# Patient Record
Sex: Female | Born: 1950 | Race: White | Hispanic: No | Marital: Married | Smoking: Never smoker
Health system: Southern US, Community
[De-identification: ages and names within clinical notes are randomized; demographics above are authoritative.]

## PROBLEM LIST (undated history)

## (undated) DIAGNOSIS — E785 Hyperlipidemia, unspecified: Secondary | ICD-10-CM

## (undated) DIAGNOSIS — F329 Major depressive disorder, single episode, unspecified: Secondary | ICD-10-CM

## (undated) DIAGNOSIS — E28319 Asymptomatic premature menopause: Secondary | ICD-10-CM

## (undated) DIAGNOSIS — I341 Nonrheumatic mitral (valve) prolapse: Secondary | ICD-10-CM

## (undated) DIAGNOSIS — M5126 Other intervertebral disc displacement, lumbar region: Secondary | ICD-10-CM

## (undated) DIAGNOSIS — F32A Depression, unspecified: Secondary | ICD-10-CM

## (undated) DIAGNOSIS — T7840XA Allergy, unspecified, initial encounter: Secondary | ICD-10-CM

## (undated) DIAGNOSIS — R918 Other nonspecific abnormal finding of lung field: Secondary | ICD-10-CM

## (undated) DIAGNOSIS — F419 Anxiety disorder, unspecified: Secondary | ICD-10-CM

## (undated) DIAGNOSIS — I10 Essential (primary) hypertension: Secondary | ICD-10-CM

## (undated) DIAGNOSIS — G43909 Migraine, unspecified, not intractable, without status migrainosus: Secondary | ICD-10-CM

## (undated) HISTORY — PX: APPENDECTOMY: SHX54

## (undated) HISTORY — DX: Nonrheumatic mitral (valve) prolapse: I34.1

## (undated) HISTORY — DX: Anxiety disorder, unspecified: F41.9

## (undated) HISTORY — DX: Essential (primary) hypertension: I10

## (undated) HISTORY — DX: Hyperlipidemia, unspecified: E78.5

## (undated) HISTORY — DX: Asymptomatic premature menopause: E28.319

## (undated) HISTORY — DX: Migraine, unspecified, not intractable, without status migrainosus: G43.909

## (undated) HISTORY — DX: Major depressive disorder, single episode, unspecified: F32.9

## (undated) HISTORY — DX: Depression, unspecified: F32.A

## (undated) HISTORY — DX: Other intervertebral disc displacement, lumbar region: M51.26

## (undated) HISTORY — DX: Allergy, unspecified, initial encounter: T78.40XA

## (undated) HISTORY — DX: Other nonspecific abnormal finding of lung field: R91.8

---

## 1977-07-12 HISTORY — PX: ABDOMINAL HYSTERECTOMY: SHX81

## 2006-02-07 LAB — HM COLONOSCOPY: HM Colonoscopy: NORMAL

## 2007-02-17 ENCOUNTER — Ambulatory Visit: Payer: Self-pay | Admitting: Family Medicine

## 2007-02-17 DIAGNOSIS — I059 Rheumatic mitral valve disease, unspecified: Secondary | ICD-10-CM

## 2007-02-17 DIAGNOSIS — M25539 Pain in unspecified wrist: Secondary | ICD-10-CM | POA: Insufficient documentation

## 2007-02-17 DIAGNOSIS — E28319 Asymptomatic premature menopause: Secondary | ICD-10-CM | POA: Insufficient documentation

## 2007-02-17 DIAGNOSIS — G43909 Migraine, unspecified, not intractable, without status migrainosus: Secondary | ICD-10-CM | POA: Insufficient documentation

## 2007-02-17 DIAGNOSIS — I341 Nonrheumatic mitral (valve) prolapse: Secondary | ICD-10-CM | POA: Insufficient documentation

## 2007-02-20 ENCOUNTER — Telehealth (INDEPENDENT_AMBULATORY_CARE_PROVIDER_SITE_OTHER): Payer: Self-pay | Admitting: *Deleted

## 2007-05-09 ENCOUNTER — Ambulatory Visit: Payer: Self-pay | Admitting: Family Medicine

## 2007-05-12 ENCOUNTER — Encounter: Payer: Self-pay | Admitting: Family Medicine

## 2007-05-16 ENCOUNTER — Telehealth (INDEPENDENT_AMBULATORY_CARE_PROVIDER_SITE_OTHER): Payer: Self-pay | Admitting: *Deleted

## 2007-08-22 ENCOUNTER — Ambulatory Visit: Payer: Self-pay | Admitting: Family Medicine

## 2007-08-22 LAB — CONVERTED CEMR LAB
Bilirubin Urine: NEGATIVE
Glucose, Urine, Semiquant: NEGATIVE
Protein, U semiquant: NEGATIVE
Specific Gravity, Urine: 1.015

## 2007-08-23 LAB — CONVERTED CEMR LAB: Clue Cells Wet Prep HPF POC: NONE SEEN

## 2008-07-12 DIAGNOSIS — R918 Other nonspecific abnormal finding of lung field: Secondary | ICD-10-CM

## 2008-07-12 HISTORY — DX: Other nonspecific abnormal finding of lung field: R91.8

## 2008-12-05 ENCOUNTER — Ambulatory Visit: Payer: Self-pay | Admitting: Family Medicine

## 2008-12-05 DIAGNOSIS — M79609 Pain in unspecified limb: Secondary | ICD-10-CM | POA: Insufficient documentation

## 2008-12-06 ENCOUNTER — Encounter: Payer: Self-pay | Admitting: Family Medicine

## 2008-12-06 ENCOUNTER — Telehealth (INDEPENDENT_AMBULATORY_CARE_PROVIDER_SITE_OTHER): Payer: Self-pay | Admitting: *Deleted

## 2008-12-06 LAB — CONVERTED CEMR LAB
Cholesterol: 234 mg/dL
LDL Cholesterol: 140 mg/dL
Triglyceride fasting, serum: 117 mg/dL

## 2008-12-10 ENCOUNTER — Encounter: Payer: Self-pay | Admitting: Family Medicine

## 2008-12-10 ENCOUNTER — Telehealth: Payer: Self-pay | Admitting: Family Medicine

## 2009-02-07 ENCOUNTER — Telehealth: Payer: Self-pay | Admitting: Family Medicine

## 2009-04-10 ENCOUNTER — Encounter: Payer: Self-pay | Admitting: Family Medicine

## 2009-04-29 ENCOUNTER — Telehealth: Payer: Self-pay | Admitting: Family Medicine

## 2009-06-27 ENCOUNTER — Telehealth: Payer: Self-pay | Admitting: Family Medicine

## 2009-08-15 ENCOUNTER — Encounter (INDEPENDENT_AMBULATORY_CARE_PROVIDER_SITE_OTHER): Payer: Self-pay | Admitting: *Deleted

## 2009-08-15 LAB — HM MAMMOGRAPHY: HM Mammogram: NORMAL

## 2009-09-15 ENCOUNTER — Telehealth: Payer: Self-pay | Admitting: Family Medicine

## 2009-10-30 ENCOUNTER — Encounter: Admission: RE | Admit: 2009-10-30 | Discharge: 2009-10-30 | Payer: Self-pay | Admitting: Family Medicine

## 2009-10-30 ENCOUNTER — Ambulatory Visit: Payer: Self-pay | Admitting: Family Medicine

## 2009-11-05 ENCOUNTER — Telehealth: Payer: Self-pay | Admitting: Family Medicine

## 2009-11-06 ENCOUNTER — Telehealth: Payer: Self-pay | Admitting: Family Medicine

## 2009-11-06 ENCOUNTER — Encounter: Payer: Self-pay | Admitting: Family Medicine

## 2009-11-14 ENCOUNTER — Telehealth: Payer: Self-pay | Admitting: Family Medicine

## 2009-11-17 ENCOUNTER — Encounter: Payer: Self-pay | Admitting: Family Medicine

## 2009-12-05 ENCOUNTER — Telehealth: Payer: Self-pay | Admitting: Family Medicine

## 2009-12-05 ENCOUNTER — Encounter: Payer: Self-pay | Admitting: Family Medicine

## 2009-12-24 ENCOUNTER — Encounter: Payer: Self-pay | Admitting: Family Medicine

## 2010-05-26 ENCOUNTER — Telehealth: Payer: Self-pay | Admitting: Family Medicine

## 2010-05-29 ENCOUNTER — Ambulatory Visit: Payer: Self-pay | Admitting: Family Medicine

## 2010-05-29 DIAGNOSIS — L639 Alopecia areata, unspecified: Secondary | ICD-10-CM | POA: Insufficient documentation

## 2010-05-29 DIAGNOSIS — N3000 Acute cystitis without hematuria: Secondary | ICD-10-CM | POA: Insufficient documentation

## 2010-05-29 DIAGNOSIS — F430 Acute stress reaction: Secondary | ICD-10-CM | POA: Insufficient documentation

## 2010-05-29 LAB — CONVERTED CEMR LAB
Blood in Urine, dipstick: NEGATIVE
Ketones, urine, test strip: NEGATIVE
Nitrite: NEGATIVE
Urobilinogen, UA: 0.2

## 2010-06-01 ENCOUNTER — Telehealth: Payer: Self-pay | Admitting: Family Medicine

## 2010-06-23 ENCOUNTER — Encounter: Payer: Self-pay | Admitting: Family Medicine

## 2010-07-03 ENCOUNTER — Encounter: Payer: Self-pay | Admitting: Family Medicine

## 2010-07-08 ENCOUNTER — Ambulatory Visit: Payer: Self-pay | Admitting: Family Medicine

## 2010-07-08 DIAGNOSIS — I10 Essential (primary) hypertension: Secondary | ICD-10-CM | POA: Insufficient documentation

## 2010-07-08 DIAGNOSIS — R030 Elevated blood-pressure reading, without diagnosis of hypertension: Secondary | ICD-10-CM | POA: Insufficient documentation

## 2010-07-24 ENCOUNTER — Telehealth: Payer: Self-pay | Admitting: Family Medicine

## 2010-08-06 ENCOUNTER — Emergency Department (HOSPITAL_COMMUNITY)
Admission: EM | Admit: 2010-08-06 | Discharge: 2010-08-06 | Payer: Self-pay | Source: Home / Self Care | Admitting: Family Medicine

## 2010-08-06 ENCOUNTER — Observation Stay (HOSPITAL_COMMUNITY)
Admission: EM | Admit: 2010-08-06 | Discharge: 2010-08-07 | Payer: Self-pay | Source: Home / Self Care | Attending: Cardiology | Admitting: Cardiology

## 2010-08-06 LAB — TROPONIN I: Troponin I: <0.01 ng/mL (ref 0.00–0.06)

## 2010-08-06 LAB — POCT I-STAT, CHEM 8
Creatinine, Ser: 1.1 mg/dL (ref 0.4–1.2)
Glucose, Bld: 83 mg/dL (ref 70–99)
Hemoglobin: 14.6 g/dL (ref 12.0–15.0)
TCO2: 25 mmol/L (ref 0–100)

## 2010-08-06 LAB — HEPATIC FUNCTION PANEL
Alkaline Phosphatase: 81 U/L (ref 39–117)
Bilirubin, Direct: <0.1 mg/dL (ref 0.0–0.3)

## 2010-08-06 LAB — DIFFERENTIAL
Basophils Absolute: 0 10*3/uL (ref 0.0–0.1)
Neutrophils Relative %: 62 % (ref 43–77)

## 2010-08-06 LAB — TSH: TSH: 3.804 u[IU]/mL (ref 0.350–4.500)

## 2010-08-06 LAB — CBC
HCT: 41.7 % (ref 36.0–46.0)
Hemoglobin: 14 g/dL (ref 12.0–15.0)
MCH: 30.2 pg (ref 26.0–34.0)
MCHC: 33.6 g/dL (ref 30.0–36.0)
MCV: 90.1 fL (ref 78.0–100.0)
Platelets: 228 10*3/uL (ref 150–400)
RBC: 4.63 MIL/uL (ref 3.87–5.11)
RDW: 12.8 % (ref 11.5–15.5)
WBC: 5.7 10*3/uL (ref 4.0–10.5)

## 2010-08-06 LAB — HEMOGLOBIN A1C: Hgb A1c MFr Bld: 5.5 % (ref <5.7)

## 2010-08-06 LAB — POCT CARDIAC MARKERS
CKMB, poc: <1 ng/mL — ABNORMAL LOW (ref 1.0–8.0)
Myoglobin, poc: 52.2 ng/mL (ref 12–200)
Troponin i, poc: <0.05 ng/mL (ref 0.00–0.09)

## 2010-08-07 LAB — BASIC METABOLIC PANEL
BUN: 15 mg/dL (ref 6–23)
Calcium: 9.4 mg/dL (ref 8.4–10.5)
Chloride: 108 mEq/L (ref 96–112)
Creatinine, Ser: 0.91 mg/dL (ref 0.4–1.2)
Glucose, Bld: 92 mg/dL (ref 70–99)
Sodium: 141 mEq/L (ref 135–145)

## 2010-08-07 LAB — CBC
Hemoglobin: 13.3 g/dL (ref 12.0–15.0)
RBC: 4.39 MIL/uL (ref 3.87–5.11)
RDW: 13 % (ref 11.5–15.5)
WBC: 4.6 10*3/uL (ref 4.0–10.5)

## 2010-08-07 LAB — LIPID PANEL
Cholesterol: 202 mg/dL — ABNORMAL HIGH (ref 0–200)
Total CHOL/HDL Ratio: 4.4 RATIO

## 2010-08-07 LAB — CARDIAC PANEL(CRET KIN+CKTOT+MB+TROPI)
CK, MB: 1 ng/mL (ref 0.3–4.0)
Relative Index: INVALID (ref 0.0–2.5)
Total CK: 57 U/L (ref 7–177)
Total CK: 55 U/L (ref 7–177)
Troponin I: <0.01 ng/mL (ref 0.00–0.06)

## 2010-08-07 NOTE — H&P (Addendum)
Stephanie Stone, Stephanie Stone NO.:  [id number]  MEDICAL RECORD NO.:  [id number]          PATIENT TYPE:  INP  LOCATION:  6531                         FACILITY:  [facility]  PHYSICIAN:  [name], MD      DATE OF BIRTH:  1950/09/04  DATE OF ADMISSION:  08/06/2010 DATE OF DISCHARGE:                             HISTORY & PHYSICAL   PRIMARY CARDIOLOGIST:  New to [facility], will be followed up by Dr. [name].  PRIMARY MEDICAL DOCTOR:  Dr. Brynda Greathouse.  CHIEF COMPLAINT:  Chest pain.  HISTORY OF PRESENT ILLNESS:  Stephanie Stone is a 60 year old female with no prior history of coronary artery disease who was at work who developed chest pain, substernally, radiating upwards into the top of her chest, with a strange associated feeling into her throat.  She is at rest at her desk at that time.  The pain lasted approximately 30-45 minutes at around 11 o'clock this morning.  She described this as a distinctly sharp sensation.  She has no history of GERD and this was not associated with any meals.  Nothing made the pain worse, including inspiration.  It resolved spontaneously.  She only had this pain once before, while driving to work that lasted seconds and resolved spontaneously.  She is fairly active with her independent activities of daily living, but does not exercise on a regular basis.  She does get some shortness of breath, but only after many stairs.  She lives in a two-story house and is able to get up and down around the steps with no problems.  She presented initially to [facility] with these symptoms and was subsequently transferred to [facility] ER.  Cardiac enzymes are negative x1 and EKGs without acute changes.  She does have some T-wave flattening in aVL as well as the V5.  PAST MEDICAL HISTORY: 1. Mitral valve prolapse, diagnosed when she had her children with no     echocardiogram in the system and no problems/followup since. 2. Situational  depression. 3. Possible hyperlipidemia with a total cholesterol of 213. 4. Strong family history of coronary artery disease, see below.  PAST SURGICAL HISTORY:  Appendectomy and hysterectomy remotely.  MEDICATIONS:  Fluoxetine 20 mg daily, B12 tablets, and multivitamins.  ALLERGIES:  CODEINE and LOVASTATIN.  SOCIAL HISTORY:  Stephanie Stone lives with her husband.  She works at [employer].  She denies any history of tobacco abuse.  She does not exercise regularly, but gets around without difficulty.  FAMILY HISTORY:  Her mother had a stent placed in her 49s and had liver procedure.  Her father died at age 75 of an MI and had first MI at 57. She has a brother who had an MI at age 81.  REVIEW OF SYSTEMS:  Positive for chest pain.  No shortness of breath at rest, nausea, vomiting, or acid reflux symptoms.  All other systems are reviewed and otherwise negative except for those noted in the HPI.  LABORATORY DATA:  WBC 5.7, hemoglobin 14.0, hematocrit 41.7, and platelet count 228.  LFTs within normal limits.  Cardiac enzymes negative x1.  Sodium 141, potassium 4, chloride 107, CO2 not done, glucose 89, BUN 19, and creatinine 1.1.  RADIOLOGY:  Chest x-ray showed chronic changes bilaterally in the lungs.  PHYSICAL EXAMINATION:  VITAL SIGNS:  Temperature 97.8, pulse 68, respirations 18, blood pressure 132/77, and pulse ox 95% on room air. GENERAL:  This is a pleasant well-appearing white female in no acute distress. HEENT:  Normocephalic and atraumatic with extraocular movements intact and clear sclerae.  Nares without discharge. NECK:  Supple without carotid bruit.  No JVD. HEART:  Auscultation of the heart reveals regular rate and rhythm with S1 and S2 without murmurs, rubs, or gallops. LUNGS:  Clear to auscultation bilaterally without wheezes, rales, or rhonchi. ABDOMEN:  Soft, nontender, and nondistended without positive bowel sounds. EXTREMITIES:  Warm, dry, and  without edema.  She has 2+ pedal pulses bilaterally. NEUROLOGICAL:  She is alert and oriented x3.  She responses to questions approximately with a normal affect.  ASSESSMENT/PLAN:  Ms. Dyani Babel was seen and examined by Dr. Shirlee Latch and myself in the ER.  This is a  60 year old female with minimal past medical history, but a strong family history of premature coronary artery disease who presents with an episode of atypical chest pain.  She developed sharp epigastric pain at rest at work that radiated up towards the center of her chest and towards the neck lasting 30 minutes.  She has no exertional symptoms.  Her first set of cardiac enzymes were negative and her EKG is unremarkable.  At this time, we plan to admit the patient to the hospital and cycle cardiac enzymes.  We will start her on aspirin and continue her fluoxetine.  Lipid panel will be checked and one could consider initiating statin therapy given her strong family history of coronary artery disease if she has borderline hyperlipidemia. If her enzymes remain negative and EKG is unchanged, she will likely be a candidate for an outpatient Myoview with followup.     [name], P.A.C.   ______________________________ [name], MD    DD/MEDQ  D:  08/06/2010  T:  08/07/2010  Job:  [id number]  cc:   Dr. Nolen Mu  Electronically Signed by [name]  on 08/07/2010 10:39:40 AM Electronically Signed by [name] MD on 08/20/2010 08:38:14 AM

## 2010-08-10 ENCOUNTER — Encounter: Payer: Self-pay | Admitting: Cardiology

## 2010-08-10 ENCOUNTER — Ambulatory Visit: Admission: RE | Admit: 2010-08-10 | Discharge: 2010-08-10 | Payer: Self-pay | Source: Home / Self Care

## 2010-08-10 ENCOUNTER — Encounter (HOSPITAL_COMMUNITY)
Admission: RE | Admit: 2010-08-10 | Discharge: 2010-08-11 | Payer: Self-pay | Source: Home / Self Care | Attending: Cardiology | Admitting: Cardiology

## 2010-08-10 ENCOUNTER — Encounter: Payer: Self-pay | Admitting: *Deleted

## 2010-08-10 NOTE — Discharge Summary (Signed)
  NAMEGENESSA, BEMAN NO.:  [id number]  MEDICAL RECORD NO.:  [id number]          PATIENT TYPE:  INP  LOCATION:  6531                         FACILITY:  [facility]  PHYSICIAN:  Verne Carrow, MDDATE OF BIRTH:  10-Sep-1950  DATE OF ADMISSION:  08/06/2010 DATE OF DISCHARGE:  08/07/2010                              DISCHARGE SUMMARY   PRIMARY CARDIOLOGIST:  [name], MD  PRIMARY CARE PROVIDER:  Nani Gasser, MD  DISCHARGE DIAGNOSIS:  Chest pain without objective evidence of ischemia.  SECONDARY DIAGNOSES: 1. History of mitral valve prolapse. 2. History of situational depression. 3. Hyperlipidemia. 4. Status post appendectomy. 5. Status post hysterectomy.  ALLERGIES:  CODEINE and LOVASTATIN.  PROCEDURES:  None.  HISTORY OF PRESENT ILLNESS:  A 60 year old female with the above problem list who was in her usual state of health until the day of admission when she was sitting at her desk, and had sudden onset of substernal chest discomfort with radiation to the top of chest and into her throat. She eventually presented to the [facility] ED where enzymes were negative.  An ECG showed no acute changes.  She was admitted for further evaluation.  HOSPITAL COURSE:  The patient was ruled out for MI and has had no additional chest pain symptoms.  We plan to discharge her home today and will arrange for followup exercise Myoview in our office in approximately 1 week with followup with Dr. Shirlee Latch thereafter.  DISCHARGE LABS:  Hemoglobin 13.3, hematocrit 39.7, WBC 4.6, platelets 208.  Sodium 141, potassium 4.2, chloride 108, CO2 28, BUN 15, creatinine 0.91, glucose 92.  Total bilirubin 0.2, alkaline phosphatase 81, AST 21, ALT 18, total protein 6.7, albumin 4.3, calcium 9.4. Hemoglobin A1c 5.5.  CK 57, MB 1.1, troponin I less than 0.01.  Total cholesterol 202, triglycerides 70, HDL 46, LDL 140.  TSH 3.04.  DISPOSITION:  The patient will be discharged  home today in good condition.  FOLLOWUP PLANS AND APPOINTMENTS:  We will arrange for followup exercise Myoview in our office early next week.  She will follow up with Dr. Shirlee Latch in approximately 1-2 weeks which we will arrange.  She was asked to follow up with Dr. Linford Arnold as previously scheduled.  DISCHARGE MEDICATIONS: 1. Aspirin 81 mg daily. 2. Nitroglycerin 0.4 mg sublingual chest pain. 3. Calcium 1 tab daily. 4. Fish oil 1 tab daily. 5. Fluoxetine 20 mg daily. 6. Multivitamin 1 tab daily. 7. Vitamin B12 1 tab daily. 8. Vitamin C 1 tab daily.  OUTSTANDING LAB STUDIES:  Followup Myoview is pending.  DURATION OF DISCHARGE ENCOUNTER:  40 minutes including physician time.     Nicolasa Ducking, ANP   ______________________________ Verne Carrow, MD    CB/MEDQ  D:  08/07/2010  T:  08/08/2010  Job:  [id number]  cc:   Nani Gasser, M.D.  Electronically Signed by Nicolasa Ducking ANP on 08/10/2010 12:30:54 PM Electronically Signed by Verne Carrow MD on 08/10/2010 02:44:52 PM

## 2010-08-11 DIAGNOSIS — E785 Hyperlipidemia, unspecified: Secondary | ICD-10-CM | POA: Insufficient documentation

## 2010-08-11 DIAGNOSIS — F4321 Adjustment disorder with depressed mood: Secondary | ICD-10-CM | POA: Insufficient documentation

## 2010-08-12 ENCOUNTER — Encounter (INDEPENDENT_AMBULATORY_CARE_PROVIDER_SITE_OTHER): Payer: Federal, State, Local not specified - PPO | Admitting: Physician Assistant

## 2010-08-12 ENCOUNTER — Encounter: Payer: Self-pay | Admitting: Physician Assistant

## 2010-08-12 DIAGNOSIS — R079 Chest pain, unspecified: Secondary | ICD-10-CM | POA: Insufficient documentation

## 2010-08-12 NOTE — Progress Notes (Signed)
Summary: UTI  Phone Note Call from Patient Call back at Work Phone [phone] Call back at #ext 160#   Caller: Patient Call For: Nani Gasser MD Summary of Call: Pt has appt for CPE on Friday with you and says she woke up Sunday am with UTI- urinary frequency and urgency- started AZO OTC and had a 2 days of Cipro left over and started it and wants to know if you will call her some more in brfore seen on Friday Initial call taken by: Kim Johnson LPN,  May 26, 2010 2:04 PM  Follow-up for Phone Call        Will call in one more day of cipro (total of 3 days.  Follow-up by: Catherine [name] MD,  May 26, 2010 2:17 PM  Additional Follow-up for Phone Call Additional follow up Details #1::        Pt notifeid of above. kJ LPN Additional Follow-up by: Kim Johnson LPN,  May 26, 2010 2:41 PM    New/Updated Medications: CIPRO 500 MG TABS (CIPROFLOXACIN HCL) Take 1 tablet by mouth two times a day Prescriptions: CIPRO 500 MG TABS (CIPROFLOXACIN HCL) Take 1 tablet by mouth two times a day  #2 x 0   Entered and Authorized by:   Catherine [name] MD   Signed by:   Catherine [name] MD on 05/26/2010   Method used:   Electronically to        [facility] [address] # [id number]* (retail)       [address] [place], [place]  [address]       Ph: [phone]       Fax: [phone]   RxID:   [id number]

## 2010-08-12 NOTE — Progress Notes (Signed)
Summary: MRI of neck results.   Phone Note Outgoing Call   Summary of Call: Call pt: Minor disc bulge at C4-5 adn C5-6 but with no definite disc herniation or stenosis.   Thus recommend PT. No surgical intervention needed. If not getting better with PT then recommend ortho consult.  Initial call taken by: Nani Gasser MD,  November 06, 2009 5:13 PM  Follow-up for Phone Call        Pt notyified. Follow-up by: Kathlene November,  November 07, 2009 7:56 AM

## 2010-08-12 NOTE — Letter (Signed)
Summary: [facility]  [facility]   Imported By: Lanelle Bal 11/27/2009 09:07:25  _____________________________________________________________________  External Attachment:    Type:   Image     Comment:   External Document

## 2010-08-12 NOTE — Progress Notes (Signed)
Summary: Pulled muscle  Phone Note Call from Patient Call back at Home Phone [phone]   Caller: Patient Call For: Nani Gasser MD Summary of Call: Pt calls and states she has pulled a muscle in her back over the weekend and wanted to know if you would call her in a muscle relaxer. On heating pad which helps some. Uses [facility] [address] Initial call taken by: Kathlene November,  September 15, 2009 11:36 AM  Follow-up for Phone Call        Needs OV>  Follow-up by: Nani Gasser MD,  September 15, 2009 11:47 AM  Additional Follow-up for Phone Call Additional follow up Details #1::        pt notified via VM needs OV to evaluate Additional Follow-up by: Kathlene November,  September 15, 2009 11:51 AM

## 2010-08-12 NOTE — Miscellaneous (Signed)
Summary: PT Eval/[facility]  PT Eval/[facility]   Imported By: Lanelle Bal 01/02/2010 08:18:28  _____________________________________________________________________  External Attachment:    Type:   Image     Comment:   External Document

## 2010-08-12 NOTE — Progress Notes (Signed)
Summary: MRI  Phone Note Call from Patient Call back at Home Phone [phone]   Caller: Patient Call For: Nani Gasser MD Summary of Call: pt called done with Prednisone did not help any still in alot of pain with her neck and ? if she needs to get MRI- that is what pt wants done Initial call taken by: Kathlene November,  November 05, 2009 8:56 AM  Follow-up for Phone Call        Since not better will schedule the MRI. Follow-up by: Nani Gasser MD,  November 05, 2009 10:28 AM

## 2010-08-12 NOTE — Assessment & Plan Note (Signed)
Summary: Left Shoulder sore    Vital Signs:  Patient profile:   60 year old female Height:      62.75 inches Weight:      142 pounds BMI:     25.45 O2 Sat:      97 % on Room air Pulse rate:   80 / minute BP sitting:   131 / 86  (left arm) Cuff size:   regular  Vitals Entered By: Payton Spark CMA (October 30, 2009 9:23 AM)  O2 Flow:  Room air CC: L shoulder pain x 3 weeks. Has had several massages but not helping pain.    Primary Care Provider:  Nani Gasser MD  CC:  L shoulder pain x 3 weeks. Has had several massages but not helping pain. Marland Kitchen  History of Present Illness: L shoulder pain x 3 weeks. Has had several massages but not helping pain.  Has tried muscle relaxers.  No inciting events.  No limitationin ROM. Occ moving her arm feels better. Worse by the end of the day.   Has been using a heating pad regular.  Dr. Jodi Mourning injected trigger point ijection and only helped for about 24 hours. Also taking aleve.  Now radiates into her upper arm.  Occ radiates into her neck but otherwise not pain in her neck.  Most pain over the upper posterior shoulder.  Was more painful after massage.  Occ pain is sharp.   Current Medications (verified): 1)  Relpax 20 Mg  Tabs (Eletriptan Hydrobromide) .... As Needed 2)  Aleve 220 Mg  Tabs (Naproxen Sodium) .... As Needed 3)  Premarin 0.625 Mg Tabs (Estrogens Conjugated) .... Take 1 Tablet By Mouth Once A Day 4)  Flexeril 10 Mg Tabs (Cyclobenzaprine Hcl) .... 1/2 To 1 At Bedtime As Needed For Muscle Spasm  Allergies (verified): 1)  ! Codeine 2)  Monistat 7 (Miconazole Nitrate)  Physical Exam  General:  Well-developed,well-nourished,in no acute distress; alert,appropriate and cooperative throughout examination Msk:  Neck and bilat shoulder w/ NROM.  Nontender over her shoulder and neck and cervical spine.  She points to her area of pain along the superior edge of the scapula and says it feels deep.  Elbow, wrist and thumb strength is 5/5.  No pain wiht internal or external rotation of the shoulder but doe have pain with crossover.  her left scapula does protrude a little more than her right but no "winging".   Pulses:  Radial 2+ bilat.  Neurologic:  Bicep and radial reflexes 2+ bilat.   Skin:  no rashes.   Psych:  Cognition and judgment appear intact. Alert and cooperative with normal attention span and concentration. No apparent delusions, illusions, hallucinations   Impression & Recommendations:  Problem # 1:  SHOULDER PAIN, LEFT (ICD-719.41) Based on area of pain would suspect trap strain but she has tried NSAID, massage, heat and trigger point injections with no relief. will get an xray of cervical spine and shoulder since pain does radiates into the shoulder and over the deltoid at time.  If normal then refer for PT.  IN meantimechanged to skelaxina dn tramadol as needed pain rellef since th alaeve is aggreavatig her stomach and the flexeril is too sedating to use during the day. Cointinue heat since does provide some temporary relief.  Her updated medication list for this problem includes:    Aleve 220 Mg Tabs (Naproxen sodium) .Marland Kitchen... As needed    Flexeril 10 Mg Tabs (Cyclobenzaprine hcl) .Marland Kitchen... 1/2 to 1 at bedtime  as needed for muscle spasm    Skelaxin 800 Mg Tabs (Metaxalone) .Marland Kitchen... Take 1 tablet by mouth three times a day as needed    Tramadol Hcl 50 Mg Tabs (Tramadol hcl) .Marland Kitchen... Take 1 tablet by mouth three times a day as needed pain  Orders: T-DG Shoulder*L* (09811) T-DG Cervical Spine 2-3 Views (91478)  Complete Medication List: 1)  Relpax 20 Mg Tabs (Eletriptan hydrobromide) .... As needed 2)  Aleve 220 Mg Tabs (Naproxen sodium) .... As needed 3)  Premarin 0.625 Mg Tabs (Estrogens conjugated) .... Take 1 tablet by mouth once a day 4)  Flexeril 10 Mg Tabs (Cyclobenzaprine hcl) .... 1/2 to 1 at bedtime as needed for muscle spasm 5)  Skelaxin 800 Mg Tabs (Metaxalone) .... Take 1 tablet by mouth three times a day as  needed 6)  Tramadol Hcl 50 Mg Tabs (Tramadol hcl) .... Take 1 tablet by mouth three times a day as needed pain  Patient Instructions: 1)   Since Aleve is irritating your stomach wean down and can try the tramadol. Also will change to skelexin  to see if less sedating the the xanaflex so that you can hopefully take something at work.   Prescriptions: TRAMADOL HCL 50 MG TABS (TRAMADOL HCL) Take 1 tablet by mouth three times a day as needed pain  #60 x 0   Entered and Authorized by:   Nani Gasser MD   Signed by:   Nani Gasser MD on 10/30/2009   Method used:   Electronically to        [facility] # [phone]* (retail)       [address] [place], [place]  [address]       Ph: [phone]       Fax: [phone]   RxID:   [id number] SKELAXIN 800 MG TABS (METAXALONE) Take 1 tablet by mouth three times a day as needed  #60 x 0   Entered and Authorized by:   Nani Gasser MD   Signed by:   Nani Gasser MD on 10/30/2009   Method used:   Electronically to        [facility] # [phone]* (retail)       [address] [place], [place]  [address]       Ph: [phone]       Fax: [phone]   RxID:   [id number]   Appended Document: Left Shoulder sore  no old injuries or surgeries to the left shoulder.

## 2010-08-12 NOTE — Progress Notes (Signed)
Summary: Needs UA order  Phone Note Call from Patient Call back at Work Phone [phone]   Caller: Patient Call For: Bathsheba Durrett Summary of Call: Pt calls and states urinating blood, painful urination. Works at [employer] and wonders if could fax order to them for a UA and anything else u think.  Fax to [phone]  And call pt with results and instructions Initial call taken by: Kathlene November,  Dec 05, 2009 8:23 AM  New Problems: HEMATURIA UNSPECIFIED (ICD-599.70)   New Problems: HEMATURIA UNSPECIFIED (ICD-599.70)  Appended Document: Needs UA order Pls let pt know that her UA was grossly + for infection.  Will go ahead and treat with abx -- I will send to her pharmacy now.  If not improved by next wk, schedule OV.  Seymour Bars, D.O.  Appended Document: Needs UA order   Appended Document: Needs UA order 12/05/2009 @ 4:37pm-Pt. notified of results and MD instructions. KJ LPN

## 2010-08-12 NOTE — Progress Notes (Signed)
Summary: refill  Phone Note Refill Request Message from:  Patient on Nov 14, 2009 10:25 AM  Refills Requested: Medication #1:  SKELAXIN 800 MG TABS Take 1 tablet by mouth three times a day as needed   Supply Requested: 1 month Initial call taken by: Kathlene November,  Nov 14, 2009 10:25 AM    Prescriptions: SKELAXIN 800 MG TABS (METAXALONE) Take 1 tablet by mouth three times a day as needed  #40 x 0   Entered and Authorized by:   Nani Gasser MD   Signed by:   Nani Gasser MD on 11/14/2009   Method used:   Printed then faxed to ...       [facility] [address] # [phone]* (retail)       [address] [place], [place]  [address]       Ph: [phone]       Fax: [phone]   RxID:   [id number]

## 2010-08-12 NOTE — Progress Notes (Signed)
Summary: questions  Phone Note Call from Patient Call back at Work Phone [phone] Call back at #160# will take to direct line   Caller: Patient Call For: Nani Gasser MD Summary of Call: Pt calls and seen last Friday and has some questions Initial call taken by: Kathlene November LPN,  June 01, 2010 11:11 AM  Follow-up for Phone Call        returned pt phone call no answer Follow-up by: Avon Gully CMA, Duncan Dull),  June 01, 2010 3:25 PM

## 2010-08-12 NOTE — Progress Notes (Signed)
Summary: Urinary sxs  Phone Note Call from Patient   Caller: Patient Call For: Nani Gasser MD Summary of Call: pt called and states she is have urinary urgency and feels the UtI is comming back. Advised pt to come in for a nurse visit for  a u/a. Pt wanted to know if we could fax an order to [facility] so that she could get it done there and send [place] the results.told her that was ok. can you please put an order in?pt willcall [place] with fax number Initial call taken by: Avon Gully CMA, Duncan Dull),  June 01, 2010 4:19 PM  Follow-up for Phone Call        OK.  Follow-up by: Nani Gasser MD,  June 01, 2010 4:32 PM  Additional Follow-up for Phone Call Additional follow up Details #1::        order faxed Additional Follow-up by: Avon Gully CMA, Duncan Dull),  June 02, 2010 4:32 PM  New Problems: ACUTE CYSTITIS (ICD-595.0)   New Problems: ACUTE CYSTITIS (ICD-595.0)

## 2010-08-12 NOTE — Progress Notes (Signed)
Summary: Would like pain med  Phone Note Call from Patient Call back at Home Phone [phone]   Caller: Patient Call For: Nani Gasser MD Summary of Call: Pt called done PT this morning- in alot of pain and discomfort- therapist suggested she call and get something for pain relief. Tramadol she can not take it caused her to be nausea so just been using Tylenol but its not much help at this point. Uses [facility] Initial call taken by: Kathlene November,  Nov 14, 2009 10:07 AM  Follow-up for Phone Call        Will call in small quantitiy of hydrocodone.  Follow-up by: Nani Gasser MD,  Nov 14, 2009 10:18 AM  Additional Follow-up for Phone Call Additional follow up Details #1::        Pt notified of above Additional Follow-up by: Kathlene November,  Nov 14, 2009 10:23 AM    New/Updated Medications: HYDROCODONE-ACETAMINOPHEN 5-500 MG TABS (HYDROCODONE-ACETAMINOPHEN) oneby mouth up to three times a day as needed for severe pain. Prescriptions: HYDROCODONE-ACETAMINOPHEN 5-500 MG TABS (HYDROCODONE-ACETAMINOPHEN) oneby mouth up to three times a day as needed for severe pain.  #30 x 0   Entered and Authorized by:   Nani Gasser MD   Signed by:   Nani Gasser MD on 11/14/2009   Method used:   Printed then faxed to ...       [facility] [address] # [phone]* (retail)       [address] [place], [place]  [address]       Ph: [phone]       Fax: [phone]   RxID:   [id number]

## 2010-08-13 LAB — CONVERTED CEMR LAB
Alkaline Phosphatase: 79 units/L
Chloride: 106 meq/L
Creatinine, Ser: 0.97 mg/dL
Potassium: 4.2 meq/L
Total Bilirubin: 0.3 mg/dL

## 2010-08-13 NOTE — Progress Notes (Signed)
  Phone Note Outgoing Call   Call placed by: Avon Gully CMA, Duncan Dull),  July 24, 2010 4:10 PM Call placed to: Patient Summary of Call: returning pt phone call in re labs: pt does not want to be on chol meds and states she will watch her diet and recheck in 4-6 weeks Initial call taken by: Avon Gully CMA, Duncan Dull),  July 24, 2010 4:10 PM

## 2010-08-13 NOTE — Assessment & Plan Note (Signed)
Summary: CPE, etc    Vital Signs:  Patient profile:   60 year old female Height:      62.75 inches Weight:      143 pounds Pulse rate:   77 / minute BP sitting:   153 / 90  (right arm) Cuff size:   regular  Vitals Entered By: Avon Gully CMA, Duncan Dull) (May 29, 2010 3:49 PM) CC: CPE,wants to make  sure UTI is clear   Primary Care Jordy Hewins:  Nani Gasser MD  CC:  CPE and wants to make  sure UTI is clear.  History of Present Illness: CPE,wants to make  sure UTI is clear.  Has been really stressed. Has 2 grandkids that are now living with her. Had UTI last weekend and took a friends Cipro adn then started to feel better yesterday.Rochele Pages AZO as well.  No fever or vomiting.   a few weeks ago she was in [place] and was walking up an incline in a parking light and suddenly felt her right hip pop.  She says it felt like it came out of joint and then went back in.  It was a suddenly painful that she actually screens.  She did not fall or lose her balance.  It happened at the more times during her trip.  She also notices it occasionally at work.  It always happens when her leg is extended forward when walking.  She is able to bend squat and turned her leg and fell without any difficulty at home.  She says it seems worse or occurs more often when she is walking up an incline.  She denies any trauma or injuries to the period no prior problems.  She is also been very stressed lately.  She is not sure she should restart her hormone therapy or consider going back on Prozac which he took several years ago with when her mother died in when her husband left her.  This time she took it for about 6 months and found it was very helpful.  She says sometimes she feels like she is going to explode.  She just is feeling overly stressed with having to take care of her two grandkids as well as working full-time.  She has not had her flu shot yet.  Current Medications (verified): 1)  Relpax 20 Mg   Tabs (Eletriptan Hydrobromide) .... As Needed 2)  Aleve 220 Mg  Tabs (Naproxen Sodium) .... As Needed 3)  Premarin 0.625 Mg Tabs (Estrogens Conjugated) .... Take 1 Tablet By Mouth Once A Day  Allergies (verified): 1)  ! Codeine 2)  Monistat 7 (Miconazole Nitrate)  Comments:  Nurse/Medical Assistant: The patient's medications and allergies were reviewed with the patient and were updated in the Medication and Allergy Lists. Avon Gully CMA, Duncan Dull) (May 29, 2010 3:54 PM)  Family History: Mother with Liver Ca Father, (died 22) brother, mother with MI Mother with HTN, CAD with stents  Review of Systems  The patient denies anorexia, fever, weight loss, weight gain, vision loss, decreased hearing, hoarseness, chest pain, syncope, dyspnea on exertion, peripheral edema, prolonged cough, headaches, hemoptysis, abdominal pain, melena, hematochezia, severe indigestion/heartburn, hematuria, incontinence, genital sores, muscle weakness, suspicious skin lesions, transient blindness, difficulty walking, depression, unusual weight change, abnormal bleeding, enlarged lymph nodes, and breast masses.    Physical Exam  General:  Well-developed,well-nourished,in no acute distress; alert,appropriate and cooperative throughout examination Head:  Normocephalic and atraumatic without obvious abnormalities. No apparent alopecia or balding. Eyes:  No corneal or  conjunctival inflammation noted. EOMI. Perrla.  Ears:  External ear exam shows no significant lesions or deformities.  Otoscopic examination reveals clear canals, tympanic membranes are intact bilaterally without bulging, retraction, inflammation or discharge. Hearing is grossly normal bilaterally. Nose:  External nasal examination shows no deformity or inflammation.  Mouth:  Oral mucosa and oropharynx without lesions or exudates.   Neck:  No deformities, masses, or tenderness noted. Chest Wall:  No deformities, masses, or tenderness  noted. Breasts:  No mass, nodules, thickening, tenderness, bulging, retraction, inflamation, nipple discharge or skin changes noted.  her ribs on the right due to low more prominent compared to the left.  I do not think this is a discrete mass but asked her to keep an eye on this area.  Her mammogram is up-to-date. Lungs:  Normal respiratory effort, chest expands symmetrically. Lungs are clear to auscultation, no crackles or wheezes. Heart:  Normal rate and regular rhythm. S1 and S2 normal without gallop, murmur, click, rub or other extra sounds. Abdomen:  Bowel sounds positive,abdomen soft and non-tender without masses, organomegaly or hernias noted. Msk:  No deformity or scoliosis noted of thoracic or lumbar spine.  right hip with normal range of motion.  No pain with internal external rotation.  Strength is normal. Pulses:  R and L carotid,radial,dorsalis pedis and posterior tibial pulses are full and equal bilaterally Extremities:  No clubbing, cyanosis, edema, or deformity noted with normal full range of motion of all joints.   Neurologic:  No cranial nerve deficits noted. Station and gait are normal.  DTRs are symmetrical throughout. Sensory, motor and coordinative functions appear intact. Skin:  no rashes.  she does have some thinning of her hair especially on the top of her head.  There is some scarring where the follicles are no longer present.  No rash or redness. Cervical Nodes:  No lymphadenopathy noted Axillary Nodes:  No palpable lymphadenopathy Psych:  Cognition and judgment appear intact. Alert and cooperative with normal attention span and concentration. No apparent delusions, illusions, hallucinations   Impression & Recommendations:  Problem # 1:  PHYSICAL EXAMINATION (ICD-V70.0) Exam is normal today.   do encourage regular exercise.  Will get screening labs today. Orders: T-Comprehensive Metabolic Panel [id number]) T-Lipid Profile ([id number])  Problem # 2:  ACUTE  CYSTITIS (ICD-595.0) her UA looks like her UTI has resolved. The following medications were removed from the medication list:    Cipro 500 Mg Tabs (Ciprofloxacin hcl) .Marland Kitchen... Take 1 tablet by mouth two times a day  Orders: UA Dipstick w/o Micro (automated)  ([id number])  Problem # 3:  STRESS REACTION, ACUTE (ICD-308.9) I do think she is very stressed right now.we discussed different options.  Let's restart the fluoxetine and have her follow-up in about 4 to 6 weeks.  I did give her prescription for her hormone therapy which he can restart if she would like.  That way she can think about it or if we start the fluoxetine first and see how she is feeling.  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  I do not think that this is actual dislocation of the hip.  I did give her a handout on some exercises to do to help strengthen the muscles in that area.  If she is not noticing improvement in 3 to 4 weeks please call the office and I will refer her to sports medicine.  If she feels she is getting worse she can certainly call  sooner. The following medications were removed from the medication list:    Flexeril 10 Mg Tabs (Cyclobenzaprine hcl) .Marland Kitchen... 1/2 to 1 at bedtime as needed for muscle spasm    Skelaxin 800 Mg Tabs (Metaxalone) .Marland Kitchen... Take 1 tablet by mouth three times a day as needed    Hydrocodone-acetaminophen 5-500 Mg Tabs (Hydrocodone-acetaminophen) ..... Oneby mouth up to three times a day as needed for severe pain. Her updated medication list for this problem includes:    Aleve 220 Mg Tabs (Naproxen sodium) .Marland Kitchen... 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  She has a diffuse hair loss over the top of her head.  She felt like this could potentially be due to hormone therapy.  I think it's more hereditary.  We discussed potential treatment.  Will start minoxidil 2% solution topical.  She is somewhat hesitant secondary to cost.   The lancets and the pharmacy so she can see him in two days.  Then we can follow her over the next two to 3 month if she decides to use the medication.  Complete Medication List: 1)  Relpax 20 Mg Tabs (Eletriptan hydrobromide) .... As needed 2)  Aleve 220 Mg Tabs (Naproxen sodium) .... As needed 3)  Estradiol 0.5 Mg Tabs (Estradiol) .... Take 1 tablet by mouth once a day 4)  Fluoxetine Hcl 20 Mg Tabs (Fluoxetine hcl) .... 1/2 tab by mouth the first week, then increase to a whole tab. 5)  Minoxidil 2 % Soln (Minoxidil) .... Apply 1ml to affected area two times a day  Patient Instructions: 1)  It is important that you exercise reguarly at least 20 minutes 5 times a week. If you develop chest pain, have severe difficulty breathing, or feel very tired, stop exercising immediately and seek medical attention.  2)  Take calcium +vitamin D daily.  3)  Remember to get your flu vaccine.  Prescriptions: MINOXIDIL 2 % SOLN (MINOXIDIL) Apply 1ml to affected area two times a day  #1 bottle x 0   Entered and Authorized by:   Nani Gasser MD   Signed by:   Nani Gasser MD on 05/29/2010   Method used:   Electronically to        [facility] # [phone]* (retail)       [address] [place], [place]  [address]       Ph: [phone]       Fax: [phone]   RxID:   [id number] FLUOXETINE HCL 20 MG TABS (FLUOXETINE HCL) 1/2 tab by mouth the first week, then increase to a whole tab.  #30 x 1   Entered and Authorized by:   Nani Gasser MD   Signed by:   Nani Gasser MD on 05/29/2010   Method used:   Electronically to        [facility] # [phone]* (retail)       [address] [place], [place]  [address]       Ph: [phone]       Fax: [phone]   RxID:   [id number] ESTRADIOL 0.5 MG TABS (ESTRADIOL) Take 1 tablet by mouth once a day  #30 x 1   Entered and Authorized by:   Nani Gasser MD   Signed by:  Nani Gasser MD on 05/29/2010   Method used:   Print then Give to Patient   RxID:   [id number]    Orders Added: 1)  UA Dipstick w/o Micro (automated)  [81003] 2)  T-Comprehensive Metabolic Panel [[id number]] 3)  T-Lipid Profile [[id number]] 4)  Est. Patient age 60-64 [3] 5)  Est. Patient Level IV [99214]    Laboratory Results   Urine Tests  Date/Time Received: 05/29/10 Date/Time Reported: 05/29/10  Routine Urinalysis   Color: yellow Appearance: Clear Glucose: negative   (Normal Range: Negative) Bilirubin: negative   (Normal Range: Negative) Ketone: negative   (Normal Range: Negative) Spec. Gravity: 1.010   (Normal Range: 1.003-1.035) Blood: negative   (Normal Range: Negative) pH: 5.5   (Normal Range: 5.0-8.0) Protein: negative   (Normal Range: Negative) Urobilinogen: 0.2   (Normal Range: 0-1) Nitrite: negative   (Normal Range: Negative) Leukocyte Esterace: trace   (Normal Range: Negative)          Appended Document: CPE, etc    Lipid Panel Test Date: 06/23/2010                        Value        Units        H/L   Reference  Cholesterol:          213          mg/dL              (657-846) LDL Cholesterol:      135          mg/dL              (96-295) HDL Cholesterol:      64           mg/dL              (28-41) Triglyceride:         72           mg/dL              (32-440)  Call pt: Labs look great. Chols is still high but a little better than last time. I would strongly recommend a cholesterol lowerin medication at bedtime. If ok ot send then let me know. December 21, 20111:23 PM  [name] MD, Santina Evans   2:01 PM July 01, 2010 McCrimmon CMA, Duncan Dull), Sheli Lush left a message on pt's vm      Lipid Management History:      Positive NCEP/ATP III risk factors include female age 18 years old or older and family history for ischemic heart disease (males less than 39 years old).  Negative NCEP/ATP III risk factors include no history of  early menopause without estrogen hormone replacement, non-diabetic, HDL cholesterol greater than 60, non-tobacco-user status, non-hypertensive, no ASHD (atherosclerotic heart disease), no prior stroke/TIA, no peripheral vascular disease, and no history of aortic aneurysm.     Lipid Assessment/Plan:      Based on NCEP/ATP III, the patient's risk factor category is "0-1 risk factors".  The patient's lipid goals are as follows: Total cholesterol goal is 200; LDL cholesterol goal is 130; HDL cholesterol goal is 40; Triglyceride goal is 150.  Her LDL cholesterol goal has not been met.

## 2010-08-13 NOTE — Assessment & Plan Note (Signed)
Summary: 5 week f/u mood   Vital Signs:  Patient profile:   60 year old female Height:      62.75 inches Weight:      143 pounds Pulse rate:   84 / minute BP sitting:   151 / 89  (right arm) Cuff size:   regular  Vitals Entered By: Avon Gully CMA, Duncan Dull) (July 08, 2010 4:03 PM) CC: f/u Fluoxetine,pt feels it is working   Primary Care Provider:  Nani Gasser MD  CC:  f/u Fluoxetine and pt feels it is working.  History of Present Illness: f/u Fluoxetine,pt feels it is working. didn't restart her hormones. Had noticed her hot flashes had improved. NOt as edgy and irritable.  Adjusting to her home life.  Coping better.  Sleeping alot. No SE from teh medicaitons.  This is the fouth time on it in her life she has had to take a medication for her mood. Says has never had to take it for more than 6 months and all of these times have been situational.   Current Medications (verified): 1)  Relpax 20 Mg  Tabs (Eletriptan Hydrobromide) .... As Needed 2)  Aleve 220 Mg  Tabs (Naproxen Sodium) .... As Needed 3)  Estradiol 0.5 Mg Tabs (Estradiol) .... Take 1 Tablet By Mouth Once A Day 4)  Fluoxetine Hcl 20 Mg Tabs (Fluoxetine Hcl) .... 1/2 Tab By Mouth The First Week, Then Increase To A Whole Tab. 5)  Minoxidil 2 % Soln (Minoxidil) .... Apply 1ml To Affected Area Two Times A Day  Allergies (verified): 1)  ! Codeine 2)  Monistat 7 (Miconazole Nitrate)  Comments:  Nurse/Medical Assistant: The patient's medications and allergies were reviewed with the patient and were updated in the Medication and Allergy Lists. Avon Gully CMA, Duncan Dull) (July 08, 2010 4:04 PM)  Family History: Reviewed history from 05/29/2010 and no changes required. Mother with Liver Ca Father, (died 30) brother, mother with MI Mother with HTN, CAD with stents   Impression & Recommendations:  Problem # 1:  STRESS REACTION, ACUTE (ICD-308.9) Doing very well overall. She prefers to stay on  current dose. I think she stil has room for improvement but she would like to stay on current dose.  F/U in 3 months to make sure on target.   PHQ-9 score is 10 GAD-7 score is 4.   Problem # 2:  ELEVATED BLOOD PRESSURE WITHOUT DIAGNOSIS OF HYPERTENSION (ICD-796.2) Discussed 2nd time BP is high. Does have family hx. Discussed lets work on diet. Discussed DASH diet. Recheck BP in 3 weeks. Her weight has not changed.  She admits she does have a lot of salt in her diet. Consider checking TSH adn BMP as well.   Complete Medication List: 1)  Relpax 20 Mg Tabs (Eletriptan hydrobromide) .... As needed 2)  Aleve 220 Mg Tabs (Naproxen sodium) .... As needed 3)  Estradiol 0.5 Mg Tabs (Estradiol) .... Take 1 tablet by mouth once a day 4)  Fluoxetine Hcl 20 Mg Tabs (Fluoxetine hcl) .... Take 1 tablet by mouth once a day 5)  Minoxidil 2 % Soln (Minoxidil) .... Apply 1ml to affected area two times a day  Patient Instructions: 1)  Google DASH diet (http://[email or web address].net/ website).  2)  Then follow up in about 3 weeks ago or call me with your blood pressure. 3)  Follow up for mood in about 3 months.  Prescriptions: FLUOXETINE HCL 20 MG TABS (FLUOXETINE HCL) Take 1 tablet by mouth once a day  #90  x 1   Entered and Authorized by:   Nani Gasser MD   Signed by:   Nani Gasser MD on 07/08/2010   Method used:   Electronically to        [facility] # [phone]* (retail)       [address] [place], [place]  [address]       Ph: [phone]       Fax: [phone]   RxID:   [id number]    Orders Added: 1)  Est. Patient Level III [72536]

## 2010-08-13 NOTE — Letter (Signed)
Summary: Anxiety & Depression Questionnaire  Anxiety & Depression Questionnaire   Imported By: Lanelle Bal 07/20/2010 12:02:18  _____________________________________________________________________  External Attachment:    Type:   Image     Comment:   External Document

## 2010-08-19 NOTE — Assessment & Plan Note (Signed)
Summary: eph/per chris/mt   Vital Signs:  Patient profile:   60 year old female Height:      62.75 inches Weight:      137.75 pounds BMI:     24.69 Pulse rate:   68 / minute BP sitting:   124 / 76  (left arm) Cuff size:   regular  Vitals Entered By: Haze Boyden, CMA (August 12, 2010 8:45 AM)  Visit Type:  Follow-up Primary Provider:  Nani Gasser MD  CC:  no cardiac complaints.  History of Present Illness: Primary Cardiologist:  Dr. [name]  Stephanie Stone is a 60 yo female with a h/o MVP, HLP and depression who was admx to [facility] 1/26-1/27 with chest pain.  She ruled out for MI.  OP myoview was done 08/10/10 and demonstrated EF 77% and no ischemia.  She returns for follow up.  Labs: TC 202, TG 78, HDL 46, LDL 140 (08/07/10)  She is doing well.  No further chest pain.  No DOE.  No syncope.  No palpitations.   Current Medications (verified): 1)  Relpax 20 Mg  Tabs (Eletriptan Hydrobromide) .... As Needed 2)  Aleve 220 Mg  Tabs (Naproxen Sodium) .... As Needed 3)  Fluoxetine Hcl 20 Mg Tabs (Fluoxetine Hcl) .... Take 1 Tablet By Mouth Once A Day 4)  Minoxidil 2 % Soln (Minoxidil) .... Apply 1ml To Affected Area Two Times A Day 5)  Aspirin 81 Mg Tbec (Aspirin) .... Take One Tablet By Mouth Daily 6)  Nitrostat 0.4 Mg Subl (Nitroglycerin) .Marland Kitchen.. 1 Tablet Under Tongue At Onset of Chest Pain; You May Repeat Every 5 Minutes For Up To 3 Doses. 7)  Calcium 600 Mg Tabs (Calcium) .Marland Kitchen.. 1 Tab Once Daily 8)  Fish Oil 1000 Mg Caps (Omega-3 Fatty Acids) .Marland Kitchen.. 1 Cap Once Daily 9)  Multivitamins   Tabs (Multiple Vitamin) .Marland Kitchen.. 1 Tab Once Daily 10)  Vitamin B-12 1000 Mcg Tabs (Cyanocobalamin) .... Take One Tablet By Mouth Once Daily 11)  Vitamin C 500 Mg Tabs (Ascorbic Acid) .Marland Kitchen.. 1 Tab Once Daily 12)  Fluoxetine Hcl 20 Mg Caps (Fluoxetine Hcl) .... Take One Tablet By Mouth Once Daily  Allergies: 1)  ! Codeine 2)  ! * Lovastatin 3)  Monistat 7 (Miconazole Nitrate)  Past History:  Past  Medical History: Had lung inflitrates 1-2 years ago, they were going to do biopsy but then it resolved on its own.  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  married to Kathlen Mody with 2 children.   Never Smoked Alcohol use-no Drug use-no Regular Exercise - no  Review of Systems       As per  the HPI.  All other systems reviewed and negative.   Physical Exam  General:  Well nourished, well developed, in no acute distress HEENT: normal Neck: no JVD Cardiac:  normal S1, S2; RRR; no murmur Lungs:  clear to auscultation bilaterally, no wheezing, rhonchi or rales Abd: soft, nontender, no hepatomegaly Ext: no  edema Vascular: no carotid  bruits Skin: warm and dry Neuro:  CNs 2-12 intact, no focal abnormalities noted    Impression & Recommendations:  Problem # 1:  CHEST PAIN UNSPECIFIED (  ICD-786.50) As noted above, her myoview is negative for ischemia and she has normal LV function.  She requires no further cardiac workup at this time and can followup with Dr. Shirlee Latch as needed.  Problem # 2:  HYPERLIPIDEMIA (ICD-272.4) She has a strong family history of coronary artery disease.  Her LDL in the hospital was 140.  I would strongly suggest that she consider treatment.  She can continue diet and exercise and followup with her primary care provider to discuss this further.  Patient Instructions: 1)  Your physician recommends that you schedule a follow-up appointment in: as needed. 2)  Your physician recommends  that you continue on your current medications as directed. Please refer to the Current Medication list given to you today.

## 2010-08-19 NOTE — Assessment & Plan Note (Signed)
Summary: Cardiology Nuclear Testing  Nuclear Med Background Indications for Stress Test: Evaluation for Ischemia, Post Hospital  Indications Comments: 08/07/10 Post Hospital @ [facility] with CP and neg enzymes    Symptoms: Chest Pain, Chest Pressure, Dizziness, DOE, Fatigue with Exertion, Light-Headedness, Near Syncope, Rapid HR, SOB  Symptoms Comments: CP radiates to throat   Nuclear Pre-Procedure Cardiac Risk Factors: Family History - CAD, Hypertension, Lipids Caffeine/Decaff Intake: 11am/530am NPO After: 7:30 AM Lungs: clear IV 0.9% NS with Angio Cath: 22g     IV Site: R Hand IV Started by: Cathlyn Parsons, RN Chest Size (in) 34     Cup Size DD     Height (in): 62.75 Weight (lb): 135 BMI: 24.19  Nuclear Med Study 1 or 2 day study:  1 day     Stress Test Type:  Stress Reading MD:  Cassell Clement, MD     Referring MD:  D.McLean Resting Radionuclide:  Technetium 70m Tetrofosmin     Resting Radionuclide Dose:  10.8 mCi  Stress Radionuclide:  Technetium 27m Tetrofosmin     Stress Radionuclide Dose:  33 mCi   Stress Protocol Exercise Time (min):  6:05 min     Max HR:  160 bpm     Predicted Max HR:  161 bpm  Max Systolic BP: 201 mm Hg     Percent Max HR:  99.38 %     METS: 7.10 Rate Pressure Product:  82956    Stress Test Technologist:  Milana Na, EMT-P     Nuclear Technologist:  Doyne Keel, CNMT  Rest Procedure  Myocardial perfusion imaging was performed at rest 45 minutes following the intravenous administration of Technetium 93m Tetrofosmin.  Stress Procedure  The patient exercised for 6:05. The patient stopped due to fatigue  and denied any chest pain.  There were no significant ST-T wave changes.  Technetium 49m Tetrofosmin was injected at peak exercise and myocardial perfusion imaging was performed after a brief delay.  QPS Raw Data Images:  Normal; no motion artifact; normal heart/lung ratio. Stress Images:  Normal homogeneous uptake in all areas of the  myocardium. Rest Images:  Normal homogeneous uptake in all areas of the myocardium. Subtraction (SDS):  No evidence of ischemia. Transient Ischemic Dilatation:  1.05  (Normal <1.22)  Lung/Heart Ratio:  .31  (Normal <0.45)  Quantitative Gated Spect Images QGS EDV:  57 ml QGS ESV:  13 ml QGS EF:  77 % QGS cine images:  No wall motion abnormalities.  Findings Normal nuclear study      Overall Impression  Exercise Capacity: Good exercise capacity. BP Response: Normal blood pressure response. Clinical Symptoms: No chest pain ECG Impression: No significant ST segment change suggestive of ischemia. Overall Impression: Normal stress nuclear study.  Appended Document: Cardiology Nuclear Testing normal study

## 2011-02-07 ENCOUNTER — Other Ambulatory Visit: Payer: Self-pay | Admitting: Family Medicine

## 2011-02-08 NOTE — Telephone Encounter (Signed)
PT NEEDS AN OFFICE VISIT within the next 30 days for anymore refills.

## 2011-02-19 ENCOUNTER — Encounter: Payer: Self-pay | Admitting: Family Medicine

## 2011-02-23 ENCOUNTER — Ambulatory Visit (INDEPENDENT_AMBULATORY_CARE_PROVIDER_SITE_OTHER): Payer: Federal, State, Local not specified - PPO | Admitting: Family Medicine

## 2011-02-23 ENCOUNTER — Encounter: Payer: Self-pay | Admitting: Family Medicine

## 2011-02-23 DIAGNOSIS — F3289 Other specified depressive episodes: Secondary | ICD-10-CM

## 2011-02-23 DIAGNOSIS — M25559 Pain in unspecified hip: Secondary | ICD-10-CM

## 2011-02-23 DIAGNOSIS — F329 Major depressive disorder, single episode, unspecified: Secondary | ICD-10-CM

## 2011-02-23 DIAGNOSIS — F32A Depression, unspecified: Secondary | ICD-10-CM

## 2011-02-23 MED ORDER — FLUOXETINE HCL 20 MG PO TABS: 20.0000 mg | ORAL_TABLET | Freq: Every day | ORAL | Status: DC

## 2011-02-23 NOTE — Progress Notes (Signed)
  Subjective:    Patient ID: Stephanie Stone, female    DOB: 08-14-1950, 60 y.o.   MRN: [id number]  HPI Depession - No SE from med. Sleeping is poor. Can fall asleep but ten wakes up frequently. Has been more stressed Lately at work.  Occ will take a benadryl at bedtime since she isnot sleeping well. She is happy with her current dose.   Prior hx of bursitis and now having bilat hip pain.  Has been taking IBU- helps some.  Feels like a deep.  Worse if has been sitting a lot an then gets up feels sore over the trochanters. . No stiffness.  Walks some for exercise. No low back pain.  No radiation of pain. She says has had  Bursitis before and this feels a little different. Says with her bursitis had pain in her hips going up stairs but none with this.   Review of Systems     Objective:   Physical Exam  Constitutional: She is oriented to person, place, and time. She appears well-developed and well-nourished.  HENT:  Head: Normocephalic and atraumatic.  Cardiovascular: Normal rate, regular rhythm and normal heart sounds.   Pulmonary/Chest: Effort normal and breath sounds normal.  Musculoskeletal:       Hips with NROM.  Not sig tender over the greater trochanters.   Neurological: She is alert and oriented to person, place, and time.  Skin: Skin is warm and dry.  Psychiatric: She has a normal mood and affect. Her behavior is normal.          Assessment & Plan:  Depression - PHQ- score of 3.  AT goal cont current regimen. F/U 6 mo.  Bilat hip pain - OA less likely as no pain over the groin crease.  Less likely to be bursitis based on exam.  Consider coming from her low back. Given h.O. On exercises  For bursitis and rec NSAID for the next 3 weeks and if not better then call the office and consider xray of hips bilat.

## 2011-02-25 ENCOUNTER — Telehealth: Payer: Self-pay | Admitting: Family Medicine

## 2011-02-25 NOTE — Telephone Encounter (Signed)
Please call patient. Normal mammogram.  Repeat in 1 year.  

## 2011-03-09 NOTE — Telephone Encounter (Signed)
Left message on pt.'s vm.

## 2011-04-05 ENCOUNTER — Encounter: Payer: Self-pay | Admitting: Family Medicine

## 2011-08-16 ENCOUNTER — Ambulatory Visit (INDEPENDENT_AMBULATORY_CARE_PROVIDER_SITE_OTHER): Payer: Federal, State, Local not specified - PPO | Admitting: Physician Assistant

## 2011-08-16 ENCOUNTER — Encounter: Payer: Self-pay | Admitting: Physician Assistant

## 2011-08-16 VITALS — BP 126/75 | HR 85 | Temp 98.3°F | Ht 62.0 in | Wt 129.0 lb

## 2011-08-16 DIAGNOSIS — R059 Cough, unspecified: Secondary | ICD-10-CM

## 2011-08-16 DIAGNOSIS — J329 Chronic sinusitis, unspecified: Secondary | ICD-10-CM

## 2011-08-16 DIAGNOSIS — R05 Cough: Secondary | ICD-10-CM

## 2011-08-16 MED ORDER — HYDROCODONE-HOMATROPINE 5-1.5 MG/5ML PO SYRP: 2.5000 mL | ORAL_SOLUTION | Freq: Every evening | ORAL | Status: AC | PRN

## 2011-08-16 MED ORDER — AZITHROMYCIN 250 MG PO TABS: ORAL_TABLET | ORAL | Status: AC

## 2011-08-16 NOTE — Patient Instructions (Signed)
Start Zpak and Hycodan at bedtime. Delsym during day. Continue all other symptomatic care.

## 2011-08-16 NOTE — Progress Notes (Signed)
  Subjective:    Patient ID: Stephanie Stone, female    DOB: 1950-07-18, 61 y.o.   MRN: [id number]  HPI Last Tuesday at 2am patient woke up with cold-like symptoms. She gradually worsened until thursday and friday she started having muscle aches. She thought it was the flu. She has tried  Alka-selkzer cold and cough and Delsylm. She has not experienced any relief. She coughs so hard at night she is not able to sleep.She has had a fever that has been low grade 100 and 101 Friday and Saturday. She has had chills, headaches, and sinus pressure. She denies nausea and vomiting.    Review of Systems     Objective:   Physical Exam  Constitutional: She is oriented to person, place, and time. She appears well-developed and well-nourished.  HENT:  Head: Normocephalic and atraumatic.  Right Ear: External ear normal.  Left Ear: External ear normal.       Bilateral maxillary tenderness. Oropharynx erythematous with no exudate.   Eyes: Right eye exhibits discharge. Left eye exhibits discharge.       Clear watery discharge.  Conjunctiva injected bilaterally.  Neck:       Bilateral cervical anterior enlargement with tenderness to palpation.  Cardiovascular: Normal rate, regular rhythm and normal heart sounds.   Pulmonary/Chest: Effort normal and breath sounds normal. She has no wheezes.  Lymphadenopathy:    She has cervical adenopathy.  Neurological: She is alert and oriented to person, place, and time.  Skin: Skin is warm and dry.  Psychiatric: She has a normal mood and affect. Her behavior is normal.          Assessment & Plan:  Sinusitis- Z-pak. Symptomatic care given. Consider sinus rinses.  Cough- Delsym OTC for daytime cough. Hycodan for night time cough.

## 2012-01-07 ENCOUNTER — Other Ambulatory Visit: Payer: Self-pay | Admitting: *Deleted

## 2012-01-07 MED ORDER — FLUOXETINE HCL 20 MG PO TABS: 20.0000 mg | ORAL_TABLET | Freq: Every day | ORAL | Status: DC

## 2012-01-25 ENCOUNTER — Ambulatory Visit (INDEPENDENT_AMBULATORY_CARE_PROVIDER_SITE_OTHER): Payer: 59 | Admitting: Family Medicine

## 2012-01-25 ENCOUNTER — Encounter: Payer: Self-pay | Admitting: Family Medicine

## 2012-01-25 VITALS — BP 139/84 | HR 77 | Ht 62.0 in | Wt 129.0 lb

## 2012-01-25 DIAGNOSIS — E785 Hyperlipidemia, unspecified: Secondary | ICD-10-CM

## 2012-01-25 DIAGNOSIS — Z0000 Encounter for general adult medical examination without abnormal findings: Secondary | ICD-10-CM

## 2012-01-25 MED ORDER — ESTRADIOL 0.1 MG/GM VA CREA: 2.0000 g | TOPICAL_CREAM | Freq: Every day | VAGINAL | Status: AC

## 2012-01-25 MED ORDER — ESTRADIOL 0.1 MG/GM VA CREA: 2.0000 g | TOPICAL_CREAM | Freq: Every day | VAGINAL | Status: DC

## 2012-01-25 NOTE — Progress Notes (Signed)
  Subjective:     Stephanie Stone is a 61 y.o. female and is here for a comprehensive physical exam. The patient reports no problems.  History   Social History  . Marital Status: Married    Spouse Name: N/A    Number of Children: N/A  . Years of Education: N/A   Occupational History  . Not on file.   Social History Main Topics  . Smoking status: Never Smoker   . Smokeless tobacco: Not on file  . Alcohol Use: No  . Drug Use: No  . Sexually Active:    Other Topics Concern  . Not on file   Social History Narrative   No regular exercise.    Health Maintenance  Topic Date Due  . Zostavax  04/28/2011  . Influenza Vaccine  04/11/2012  . Mammogram  02/22/2013  . Colonoscopy  02/08/2016  . Tetanus/tdap  09/09/2021    The following portions of the patient's history were reviewed and updated as appropriate: allergies, current medications, past family history, past medical history, past social history, past surgical history and problem list.  Review of Systems A comprehensive review of systems was negative.   Objective:    BP 139/84  Pulse 77  Ht 5\' 2"  (1.575 m)  Wt 129 lb (58.514 kg)  BMI 23.59 kg/m2  SpO2 98% General appearance: alert, cooperative and appears stated age Head: Normocephalic, without obvious abnormality, atraumatic Eyes: conj clear, EOMi, PEERLA Ears: normal TM's and external ear canals both ears Nose: Nares normal. Septum midline. Mucosa normal. No drainage or sinus tenderness. Throat: lips, mucosa, and tongue normal; teeth and gums normal Neck: no adenopathy, no carotid bruit, no JVD, supple, symmetrical, trachea midline and thyroid not enlarged, symmetric, no tenderness/mass/nodules Back: symmetric, no curvature. ROM normal. No CVA tenderness. Lungs: clear to auscultation bilaterally Breasts: normal appearance, no masses or tenderness, slight small lump along the medial left medial over the sternum, most like edge of rib.  Heart: regular rate and rhythm, S1,  S2 normal, no murmur, click, rub or gallop Abdomen: soft, non-tender; bowel sounds normal; no masses,  no organomegaly Extremities: extremities normal, atraumatic, no cyanosis or edema Pulses: 2+ and symmetric Skin: Skin color, texture, turgor normal. No rashes or lesions Lymph nodes: Cervical, supraclavicular, and axillary nodes normal. Neurologic: Alert and oriented X 3, normal strength and tone. Normal symmetric reflexes. Normal coordination and gait    Assessment:    Healthy female exam.      Plan:     See After Visit Summary for Counseling Recommendations  Start a regular exercise program and make sure you are eating a healthy diet Try to eat 4 servings of dairy  Your vaccines are up to date.  BP looks great today Discussed shingles vaccine. She will check with her insurance [facility]slip given. She will get at [facility] at work.  Screening mammo sched for August.

## 2012-01-25 NOTE — Patient Instructions (Addendum)
We will call you with your lab results. If you don't here from us in about a week then please give us a call at [phone]. Start a regular exercise program and make sure you are eating a healthy diet Try to eat 4 servings of dairy a day . Your vaccines are up to date.   

## 2012-06-09 ENCOUNTER — Other Ambulatory Visit: Payer: Self-pay | Admitting: Family Medicine

## 2012-06-16 ENCOUNTER — Ambulatory Visit: Payer: 59

## 2012-08-10 ENCOUNTER — Encounter: Payer: Self-pay | Admitting: *Deleted

## 2012-08-10 ENCOUNTER — Emergency Department
Admission: EM | Admit: 2012-08-10 | Discharge: 2012-08-10 | Disposition: A | Discharge status: Home / Self Care | Payer: 59 | Source: Home / Self Care | Attending: Family Medicine | Admitting: Family Medicine

## 2012-08-10 ENCOUNTER — Emergency Department (INDEPENDENT_AMBULATORY_CARE_PROVIDER_SITE_OTHER): Payer: 59

## 2012-08-10 DIAGNOSIS — S298XXA Other specified injuries of thorax, initial encounter: Secondary | ICD-10-CM

## 2012-08-10 DIAGNOSIS — W108XXA Fall (on) (from) other stairs and steps, initial encounter: Secondary | ICD-10-CM

## 2012-08-10 DIAGNOSIS — W19XXXA Unspecified fall, initial encounter: Secondary | ICD-10-CM

## 2012-08-10 DIAGNOSIS — S20219A Contusion of unspecified front wall of thorax, initial encounter: Secondary | ICD-10-CM

## 2012-08-10 DIAGNOSIS — R079 Chest pain, unspecified: Secondary | ICD-10-CM

## 2012-08-10 MED ORDER — KETOROLAC TROMETHAMINE 30 MG/ML IJ SOLN
30.0000 mg | Freq: Once | INTRAMUSCULAR | Status: AC
  Administered 2012-08-10: 30 mg via INTRAMUSCULAR

## 2012-08-10 MED ORDER — MELOXICAM 15 MG PO TABS: 15.0000 mg | ORAL_TABLET | Freq: Every day | ORAL | Status: DC

## 2012-08-10 NOTE — ED Provider Notes (Signed)
History     CSN: [id number]  Arrival date & time 08/10/12  0910   First MD Initiated Contact with Patient 08/10/12 0935      Chief Complaint  Patient presents with  . Rib Injury    HPI Patient presents today with chief complaint of left-sided rib pain. Patient states she fell down some steps approximately 5 days ago landed on concrete on her left side. Patient states that she initially had moderate to severe pain at that point however the pain subsided and she felt like she would be able to manage at home. Patient states that she has noticed the pain is persisted with the pain seemed to be worse at night. Patient denies any bruising or shortness of breath. Patient does have some mild rib pain with taking deep breaths. Patient states she has she's had episodes of costochondritis in the past and this feels similar to it except worse. Pain does seem to keep patient up at night. Has been taking ibuprofen with mild improvement in sxs.   Past Medical History  Diagnosis Date  . Lung infiltrate 2010    Resolved on its own without biopsy  . MVP (mitral valve prolapse)   . Hyperlipidemia   . Depression   . Hypertension   . Alopecia     areata  . Migraines   . Menopause, premature     Past Surgical History  Procedure Date  . Appendectomy   . Abdominal hysterectomy 1979    endometriosis    Family History  Problem Relation Age of Onset  . Heart attack Father   . Heart attack Mother   . Hypertension Mother   . Coronary artery disease Mother 3  . Liver cancer Mother   . Heart attack Brother 50    History  Substance Use Topics  . Smoking status: Never Smoker   . Smokeless tobacco: Never Used  . Alcohol Use: No    OB History    Grav Para Term Preterm Abortions TAB SAB Ect Mult Living                  Review of Systems  All other systems reviewed and are negative.    Allergies  Codeine; Lovastatin; Miconazole nitrate; and Sulfur  Home Medications   Current  Outpatient Rx  Name  Route  Sig  Dispense  Refill  . ASPIRIN 81 MG PO TBEC   Oral   Take 81 mg by mouth daily.           Marland Kitchen ELETRIPTAN HYDROBROMIDE 20 MG PO TABS   Oral   One tablet by mouth as needed for migraine headache.  If the headache improves and then returns, dose may be repeated after 2 hours have elapsed since first dose (do not exceed 80 mg per day). may repeat in 2 hours if necessary          . ESTRADIOL 0.1 MG/GM VA CREA   Vaginal   Place 0.25 Applicatorfuls vaginally daily.   42.5 g   6   . NAPROXEN SODIUM 220 MG PO TABS   Oral   Take 220 mg by mouth 2 (two) times daily as needed.             BP 125/79  Pulse 78  Resp 14  Ht 5\' 2"  (1.575 m)  Wt 133 lb (60.328 kg)  BMI 24.33 kg/m2  SpO2 99%  Physical Exam  Constitutional: She appears well-developed and well-nourished.  HENT:  Head: Normocephalic  and atraumatic.  Eyes: Conjunctivae normal are normal. Pupils are equal, round, and reactive to light.  Neck: Normal range of motion. Neck supple.  Cardiovascular: Normal rate, regular rhythm and normal heart sounds.   Pulmonary/Chest: Effort normal.         + TTP over affected area      ED Course  Procedures (including critical care time)  Labs Reviewed - No data to display Dg Ribs Unilateral W/chest Left  08/10/2012  *RADIOLOGY REPORT*  Clinical Data: Fall down stairs.  Left rib injury and pain.  LEFT RIBS AND CHEST - 3+ VIEW  Comparison: 08/06/2010  Findings: No fracture or other bone lesions seen involving the left ribs.  No evidence of pneumothorax or hemothorax.  Left perihilar scarring is again demonstrated, as well as symmetric biapical scarring.  No evidence of pulmonary infiltrate or edema. No evidence of pleural effusion.  Heart size is normal.  No mass or lymphadenopathy identified.  IMPRESSION:  1.  No acute findings. 2.  Chronic scarring.  No active lung disease.   Original Report Authenticated By: Myles Rosenthal, M.D.      1. Fall   2. Rib  contusion       MDM  Xrays negative for fracture.  Pt given toradol 30mg  IM x1 for symptomatic relief.  Incentive spirometer for atelectasis ppx.  Mobic at home for pain.  Discussed supportive care and resp red flags.  Follow up with PCP in next 5-7 days.     The patient and/or caregiver has been counseled thoroughly with regard to treatment plan and/or medications prescribed including dosage, schedule, interactions, rationale for use, and possible side effects and they verbalize understanding. Diagnoses and expected course of recovery discussed and will return if not improved as expected or if the condition worsens. Patient and/or caregiver verbalized understanding.             Doree Albee, MD 08/10/12 1021

## 2012-08-10 NOTE — ED Notes (Signed)
Patient reports falling down concrete steps 5 days ago and landing on her left side. She immediately felt pain on her left side. Since, the pain has persisted on the left side, ok in the AM and progressively worse throughout the day.

## 2012-08-21 ENCOUNTER — Telehealth: Payer: Self-pay | Admitting: Medical Oncology

## 2012-08-21 ENCOUNTER — Other Ambulatory Visit: Payer: Self-pay | Admitting: Medical Oncology

## 2012-08-21 ENCOUNTER — Telehealth: Payer: Self-pay | Admitting: *Deleted

## 2012-08-21 ENCOUNTER — Other Ambulatory Visit: Payer: Self-pay | Admitting: Emergency Medicine

## 2012-08-21 NOTE — Telephone Encounter (Signed)
I spoke to husband and told him to please tell Kiasia  to ignore any message she gets about an appt at the cancer center. He voices understanding.

## 2012-08-21 NOTE — Telephone Encounter (Signed)
Per staff message and POF I have scheduled appts.  JMW  

## 2012-08-22 ENCOUNTER — Other Ambulatory Visit: Payer: 59 | Admitting: Lab

## 2012-09-18 ENCOUNTER — Other Ambulatory Visit: Payer: Self-pay | Admitting: Family Medicine

## 2012-11-21 ENCOUNTER — Ambulatory Visit (INDEPENDENT_AMBULATORY_CARE_PROVIDER_SITE_OTHER): Payer: 59 | Admitting: Family Medicine

## 2012-11-21 ENCOUNTER — Encounter: Payer: Self-pay | Admitting: Family Medicine

## 2012-11-21 VITALS — BP 122/75 | HR 68 | Ht 62.0 in | Wt 132.0 lb

## 2012-11-21 DIAGNOSIS — M545 Low back pain, unspecified: Secondary | ICD-10-CM

## 2012-11-21 MED ORDER — HYDROCODONE-ACETAMINOPHEN 5-325 MG PO TABS: 1.0000 | ORAL_TABLET | Freq: Four times a day (QID) | ORAL | Status: DC | PRN

## 2012-11-21 MED ORDER — CYCLOBENZAPRINE HCL 10 MG PO TABS: 10.0000 mg | ORAL_TABLET | Freq: Every evening | ORAL | Status: DC | PRN

## 2012-11-21 MED ORDER — ELETRIPTAN HYDROBROMIDE 20 MG PO TABS: 20.0000 mg | ORAL_TABLET | ORAL | Status: DC | PRN

## 2012-11-21 NOTE — Progress Notes (Signed)
  Subjective:    Patient ID: Stephanie Stone, female    DOB: 1951-02-17, 62 y.o.   MRN: [id number]  HPI 4 days ago hurt her back moving wrought iron furniture.  Say has been getting worse.  Says it feels like she is having muscle tension and spasm.  Some radiation down her legs, worse all the way across. Sitting ow worse than standing and laying flat is worse. She is going to [place] at the end of the month.     Review of Systems     Objective:   Physical Exam  Constitutional: She is oriented to person, place, and time. She appears well-developed and well-nourished.  Musculoskeletal:  Lumbar flexion, extension, rot right and left and side bending are normal. Pain with side bending. Patellar reflexes 2+ bilat.  Hip, knee an dankle strength is 5/5 bilat.   Neurological: She is alert and oriented to person, place, and time.  Skin: Skin is warm and dry.  Psychiatric: She has a normal mood and affect. Her behavior is normal.          Assessment & Plan:  Low Back Pain - muscular skeletal sprain. Handout given on stretches and exercises. Recommend using heat for 10-15 minutes at a time. She has some meloxicam at home, 15 mg that she can use daily instead of the ibuprofen. She can also take Tylenol with it. Given prescription for muscle relaxer to use at bedtime. I gave her a small quantity, 15 tabs, hydrocodone to use only when pain is more severe. If she's not with 50% better in the next week and a half then please let me know. She supposed to be leaving for [place] at the end of the month and hopefully she'll be better. She's make sure that she stretches frequently during her long plane ride.

## 2012-11-21 NOTE — Patient Instructions (Addendum)
Start your stretches.  Can use the muscle relaxer and pain med at night Use your meloxicam once a day and cause use with tylenol Call if not at least 50% better in 10 days.

## 2013-03-26 ENCOUNTER — Other Ambulatory Visit: Payer: Self-pay | Admitting: Family Medicine

## 2013-04-20 ENCOUNTER — Ambulatory Visit (INDEPENDENT_AMBULATORY_CARE_PROVIDER_SITE_OTHER): Payer: 59 | Admitting: Family Medicine

## 2013-04-20 ENCOUNTER — Encounter: Payer: Self-pay | Admitting: Family Medicine

## 2013-04-20 VITALS — BP 138/80 | HR 60 | Ht 62.0 in | Wt 139.0 lb

## 2013-04-20 DIAGNOSIS — N9489 Other specified conditions associated with female genital organs and menstrual cycle: Secondary | ICD-10-CM

## 2013-04-20 DIAGNOSIS — Z1231 Encounter for screening mammogram for malignant neoplasm of breast: Secondary | ICD-10-CM

## 2013-04-20 DIAGNOSIS — Z0000 Encounter for general adult medical examination without abnormal findings: Secondary | ICD-10-CM

## 2013-04-20 DIAGNOSIS — N898 Other specified noninflammatory disorders of vagina: Secondary | ICD-10-CM

## 2013-04-20 DIAGNOSIS — L853 Xerosis cutis: Secondary | ICD-10-CM

## 2013-04-20 DIAGNOSIS — L738 Other specified follicular disorders: Secondary | ICD-10-CM

## 2013-04-20 MED ORDER — ESTRADIOL 0.025 MG/24HR TD PTTW: 1.0000 | MEDICATED_PATCH | TRANSDERMAL | Status: DC

## 2013-04-20 MED ORDER — FLUOXETINE HCL 20 MG PO TABS: ORAL_TABLET | ORAL | Status: DC

## 2013-04-20 NOTE — Progress Notes (Signed)
Subjective:     Stephanie Stone is a 62 y.o. female and is here for a comprehensive physical exam. The patient reports problems - still having alot of vaginal dryness. She had tried the Premarin and just really didn't like it. She wanted to go back to estrogen hormonal therapy.. She also complains of excessive generalized skin dryness since stopping her hormone therapy as well. She has gained some weight but says it's because she hasn't been exercising.  History   Social History  . Marital Status: Married    Spouse Name: N/A    Number of Children: N/A  . Years of Education: N/A   Occupational History  . Not on file.   Social History Main Topics  . Smoking status: Never Smoker   . Smokeless tobacco: Never Used  . Alcohol Use: No  . Drug Use: No  . Sexual Activity:    Other Topics Concern  . Not on file   Social History Narrative   No regular exercise.    Health Maintenance  Topic Date Due  . Zostavax  04/28/2011  . Mammogram  02/22/2013  . Influenza Vaccine  02/09/2014  . Colonoscopy  02/08/2016  . Tetanus/tdap  09/09/2021    The following portions of the patient's history were reviewed and updated as appropriate: allergies, current medications, past family history, past medical history, past social history, past surgical history and problem list.  Review of Systems A comprehensive review of systems was negative.   Objective:    BP 138/80  Pulse 60  Ht 5\' 2"  (1.575 m)  Wt 139 lb (63.05 kg)  BMI 25.42 kg/m2 General appearance: alert, cooperative and appears stated age Head: Normocephalic, without obvious abnormality, atraumatic Eyes: conj clear, EOMi, PEERLA Ears: normal TM's and external ear canals both ears Nose: Nares normal. Septum midline. Mucosa normal. No drainage or sinus tenderness. Throat: lips, mucosa, and tongue normal; teeth and gums normal Neck: no adenopathy, no carotid bruit, no JVD, supple, symmetrical, trachea midline and thyroid not enlarged,  symmetric, no tenderness/mass/nodules Back: symmetric, no curvature. ROM normal. No CVA tenderness. Lungs: clear to auscultation bilaterally Breasts: normal appearance, no masses or tenderness Heart: regular rate and rhythm, S1, S2 normal, no murmur, click, rub or gallop Abdomen: soft, non-tender; bowel sounds normal; no masses,  no organomegaly Extremities: extremities normal, atraumatic, no cyanosis or edema Pulses: 2+ and symmetric Skin: Skin color, texture, turgor normal. No rashes or lesions Lymph nodes: Cervical, supraclavicular, and axillary nodes normal. Neurologic: Alert and oriented X 3, normal strength and tone. Normal symmetric reflexes. Normal coordination and gait    Assessment:    Healthy female exam.      Plan:     See After Visit Summary for Counseling Recommendations  Keep up a regular exercise program and make sure you are eating a healthy diet Try to eat 4 servings of dairy a day, or if you are lactose intolerant take a calcium with vitamin D daily.  Your vaccines are up to date.  She got a flu vaccine at work today. CMP, lipids, TSH ordered.  Next schedule for mammogram.order placed.   Discussed shinles vaccine today. Handout given.  Vaginal dryness-we discussed the pros and cons of going back on a topical estrogen. Increase risk of breast cancer as well as heart disease. I reminded her that if she is going to go back on estrogen that she absolutely has to get her mammograms yearly. Will send over a prescription for Vivelle-Dot. She has had a  history of a hysterectomy at age 16. I also discussed with her a newer medication called Osphena, a nonhormonal treatment for vaginal dryness for postmenopausal women. Handout provided.

## 2013-04-20 NOTE — Patient Instructions (Signed)
Keep up a regular exercise program and make sure you are eating a healthy diet Try to eat 4 servings of dairy a day, or if you are lactose intolerant take a calcium with vitamin D daily.  Your vaccines are up to date.   

## 2013-04-25 ENCOUNTER — Telehealth: Payer: Self-pay | Admitting: *Deleted

## 2013-04-25 ENCOUNTER — Ambulatory Visit (INDEPENDENT_AMBULATORY_CARE_PROVIDER_SITE_OTHER): Payer: 59

## 2013-04-25 DIAGNOSIS — W19XXXA Unspecified fall, initial encounter: Secondary | ICD-10-CM

## 2013-04-25 DIAGNOSIS — M533 Sacrococcygeal disorders, not elsewhere classified: Secondary | ICD-10-CM

## 2013-04-25 DIAGNOSIS — L738 Other specified follicular disorders: Secondary | ICD-10-CM

## 2013-04-25 DIAGNOSIS — Z0000 Encounter for general adult medical examination without abnormal findings: Secondary | ICD-10-CM

## 2013-04-25 NOTE — Telephone Encounter (Signed)
Pt states Saturday she fell on her coccyx and has a light bruise on the outside. States she has no pain down her extremities but it is painful, hard to sit and get up and down. States she is sitting sideways. She has been using heating pad, took some Ibuprofen but it has irritated her stomach and has used extra strength Tylenol. States if she needs an xray she can go to [facility] on [address] when she gets off work at $:30. She is also asking if we can put her labs on a [facility] sheet so that it can be done at her job and I can fax it to her at [phone].

## 2013-04-25 NOTE — Telephone Encounter (Signed)
LM on VM about xray and I faxed the lab orders and asked pt to have lab to fax results to [place].  Meyer Cory, LPN

## 2013-04-25 NOTE — Telephone Encounter (Signed)
I put in the order for the x-ray for further imaging. Okay to labs on labwork she as she needs. If you look under the labs tab you can see what was ordered

## 2013-04-27 LAB — LIPID PANEL
HDL: 55 mg/dL (ref 35–70)
LDL Cholesterol: 146 mg/dL
Triglycerides: 98 mg/dL (ref 40–160)

## 2013-04-27 LAB — BASIC METABOLIC PANEL: Creatinine: 0.8 mg/dL (ref 0.5–1.1)

## 2013-04-27 LAB — CALCIUM: Calcium: 10 mg/dL

## 2013-05-02 ENCOUNTER — Telehealth: Payer: Self-pay | Admitting: Family Medicine

## 2013-05-02 DIAGNOSIS — R7989 Other specified abnormal findings of blood chemistry: Secondary | ICD-10-CM

## 2013-05-02 NOTE — Telephone Encounter (Signed)
Pt informed and will fax lab orders to pt at [phone].

## 2013-05-02 NOTE — Telephone Encounter (Signed)
Call pt: labs show CMP is nl.  LDL and TC are high.  LDL was 146. It was actually a little higher than last time. Normal is less than 100. Overall she is still fairly low risk for heart disease but I still would recommend working on exercise, low-fat diet for better control of her cholesterol. TSH level was also off. It was just slightly elevated. I would like to repeat the TSH with a free T3 and a free T4 for panel. She can go in the next couple weeks if she would like for confirmation. If it's still elevated then she may actually be hypothyroid.

## 2013-05-03 ENCOUNTER — Telehealth: Payer: Self-pay | Admitting: *Deleted

## 2013-05-03 DIAGNOSIS — R6889 Other general symptoms and signs: Secondary | ICD-10-CM

## 2013-05-10 ENCOUNTER — Encounter: Payer: Self-pay | Admitting: *Deleted

## 2013-05-16 ENCOUNTER — Telehealth: Payer: Self-pay | Admitting: Family Medicine

## 2013-05-16 NOTE — Telephone Encounter (Signed)
lvm informing pt of nl results and to call back if she should have any question.Loralee Pacas [place]

## 2013-05-16 NOTE — Telephone Encounter (Signed)
Call patient: Thyroid levels look great.

## 2013-05-30 ENCOUNTER — Encounter: Payer: Self-pay | Admitting: Family Medicine

## 2013-07-17 ENCOUNTER — Encounter: Payer: Self-pay | Admitting: *Deleted

## 2013-09-03 ENCOUNTER — Other Ambulatory Visit: Payer: Self-pay | Admitting: Adult Health

## 2013-09-16 ENCOUNTER — Emergency Department (INDEPENDENT_AMBULATORY_CARE_PROVIDER_SITE_OTHER): Payer: Federal, State, Local not specified - PPO

## 2013-09-16 ENCOUNTER — Encounter: Payer: Self-pay | Admitting: Emergency Medicine

## 2013-09-16 ENCOUNTER — Emergency Department (INDEPENDENT_AMBULATORY_CARE_PROVIDER_SITE_OTHER)
Admission: EM | Admit: 2013-09-16 | Discharge: 2013-09-16 | Disposition: A | Discharge status: Home / Self Care | Payer: Federal, State, Local not specified - PPO | Source: Home / Self Care | Attending: Family Medicine | Admitting: Family Medicine

## 2013-09-16 DIAGNOSIS — R6889 Other general symptoms and signs: Secondary | ICD-10-CM

## 2013-09-16 DIAGNOSIS — R918 Other nonspecific abnormal finding of lung field: Secondary | ICD-10-CM

## 2013-09-16 DIAGNOSIS — R0609 Other forms of dyspnea: Secondary | ICD-10-CM

## 2013-09-16 DIAGNOSIS — R05 Cough: Secondary | ICD-10-CM

## 2013-09-16 DIAGNOSIS — J069 Acute upper respiratory infection, unspecified: Secondary | ICD-10-CM

## 2013-09-16 DIAGNOSIS — J189 Pneumonia, unspecified organism: Secondary | ICD-10-CM

## 2013-09-16 DIAGNOSIS — R0989 Other specified symptoms and signs involving the circulatory and respiratory systems: Secondary | ICD-10-CM

## 2013-09-16 DIAGNOSIS — R059 Cough, unspecified: Secondary | ICD-10-CM

## 2013-09-16 DIAGNOSIS — J40 Bronchitis, not specified as acute or chronic: Secondary | ICD-10-CM

## 2013-09-16 MED ORDER — OSELTAMIVIR PHOSPHATE 75 MG PO CAPS: 75.0000 mg | ORAL_CAPSULE | Freq: Two times a day (BID) | ORAL | Status: DC

## 2013-09-16 MED ORDER — BENZONATATE 100 MG PO CAPS: 100.0000 mg | ORAL_CAPSULE | Freq: Three times a day (TID) | ORAL | Status: DC | PRN

## 2013-09-16 MED ORDER — AZITHROMYCIN 250 MG PO TABS: ORAL_TABLET | ORAL | Status: DC

## 2013-09-16 NOTE — ED Notes (Signed)
Nichola complains of bilateral ear pain, dry cough, headaches, sore throat, runny nose, hoarseness and shortness of breath for 1 week. She reports feeling worse for the last 2 days with fevers, chills and body aches. She has been taking OTC cold and flu with little relief.

## 2013-09-16 NOTE — ED Provider Notes (Signed)
CSN: [id number]     Arrival date & time 09/16/13  1125 History   First MD Initiated Contact with Patient 09/16/13 1126     Chief Complaint  Patient presents with  . Generalized Body Aches    x 2 days  . Fever    x 2 days  . Cough    x 1 week  . Otalgia    x 1 week    HPI  URI Symptoms Onset: 1 week  Description: rhinorrhea, nasal congestion, cough, mild dyspnea  Modifying factors:  Prior hx/o ? PNA/lung infiltrates. Self resolved   Symptoms Nasal discharge: yes Fever: no Sore throat: yes Cough: yes Wheezing: no Ear pain: no GI symptoms: no Sick contacts: yes  Red Flags  Stiff neck: no Dyspnea: mild exertional  Rash: no Swallowing difficulty: no  Sinusitis Risk Factors Headache/face pain: yes Double sickening: no tooth pain: no  Allergy Risk Factors Sneezing: no Itchy scratchy throat: no Seasonal symptoms: no  Flu Risk Factors Headache: yes muscle aches: yes severe fatigue: mild    Past Medical History  Diagnosis Date  . Lung infiltrate 2010    Resolved on its own without biopsy  . MVP (mitral valve prolapse)   . Hyperlipidemia   . Depression   . Hypertension   . Alopecia     areata  . Migraines   . Menopause, premature    Past Surgical History  Procedure Laterality Date  . Appendectomy    . Abdominal hysterectomy  1979    endometriosis   Family History  Problem Relation Age of Onset  . Heart attack Father   . Heart attack Mother   . Hypertension Mother   . Coronary artery disease Mother 52  . Liver cancer Mother   . Heart attack Brother 50   History  Substance Use Topics  . Smoking status: Never Smoker   . Smokeless tobacco: Never Used  . Alcohol Use: No   OB History   Grav Para Term Preterm Abortions TAB SAB Ect Mult Living                 Review of Systems  All other systems reviewed and are negative.    Allergies  Codeine; Lovastatin; Miconazole nitrate; and Sulfur  Home Medications   Current Outpatient Rx  Name   Route  Sig  Dispense  Refill  . DM-Phenylephrine-Acetaminophen (ALKA-SELTZER PLUS DAY COLD/FLU) 10-5-325 MG CAPS   Oral   Take by mouth.         . eletriptan (RELPAX) 20 MG tablet   Oral   One tablet by mouth at onset of headache. May repeat in 2 hours if headache persists or recurs. may repeat in 2 hours if necessary   10 tablet   3   . aspirin 81 MG EC tablet   Oral   Take 81 mg by mouth daily.           Marland Kitchen azithromycin (ZITHROMAX) 250 MG tablet      Take 2 tabs PO x 1 dose, then 1 tab PO QD x 4 days   6 tablet   0   . estradiol (VIVELLE-DOT) 0.025 MG/24HR   Transdermal   Place 1 patch onto the skin 2 (two) times a week.   8 patch   12   . FLUoxetine (PROZAC) 20 MG tablet      TAKE 1 TABLET BY MOUTH EVERY DAY   30 tablet   5     Patient needs  appointment   . oseltamivir (TAMIFLU) 75 MG capsule   Oral   Take 1 capsule (75 mg total) by mouth 2 (two) times daily.   10 capsule   0    BP 116/76  Pulse 90  Temp(Src) 98.4 F (36.9 C) (Oral)  Ht 5' 2.5" (1.588 m)  Wt 139 lb (63.05 kg)  BMI 25.00 kg/m2  SpO2 98% Physical Exam  Constitutional: She is oriented to person, place, and time. She appears well-developed and well-nourished.  HENT:  Head: Normocephalic and atraumatic.  Right Ear: External ear normal.  Left Ear: External ear normal.  +nasal erythema, rhinorrhea bilaterally, + post oropharyngeal erythema    Eyes: Conjunctivae are normal. Pupils are equal, round, and reactive to light.  Neck: Normal range of motion. Neck supple.  Cardiovascular: Normal rate, regular rhythm and normal heart sounds.   Pulmonary/Chest: Effort normal and breath sounds normal.  Abdominal: Soft.  Musculoskeletal: Normal range of motion.  Neurological: She is alert and oriented to person, place, and time.  Skin: Skin is warm.    ED Course  Procedures (including critical care time) Labs Review Labs Reviewed - No data to display Imaging Review No results found.   MDM    1. URI (upper respiratory infection)   2. Bronchitis   3. Flu-like symptoms   4. CAP (community acquired pneumonia)    Suspect primarily viral source of etiology Reasurring resp exam today. Satting well on RA.  Given prior lung hx, will place on course of tamiflu and zpak.  CXR findings consistent w/ PNA-zpak coverage for CAP  Discussed infectious and resp red flags at length  Follow up as needed.     The patient and/or caregiver has been counseled thoroughly with regard to treatment plan and/or medications prescribed including dosage, schedule, interactions, rationale for use, and possible side effects and they verbalize understanding. Diagnoses and expected course of recovery discussed and will return if not improved as expected or if the condition worsens. Patient and/or caregiver verbalized understanding.          Shanda Howells, MD 09/16/13 1251

## 2013-09-17 ENCOUNTER — Telehealth: Payer: Self-pay

## 2013-09-17 MED ORDER — CEFDINIR 300 MG PO CAPS: 300.0000 mg | ORAL_CAPSULE | Freq: Two times a day (BID) | ORAL | Status: DC

## 2013-09-17 NOTE — Telephone Encounter (Signed)
Message copied by Narda Rutherford on Mon Sep 17, 2013  2:09 PM ------      Message from: Theone Murdoch A      Created: Mon Sep 17, 2013  1:55 PM      Contact: [phone]       Discontinue azithromycin and begin Omnicef 300mg , one BID, Rx#20, no refill      ----- Message -----         From: Narda Rutherford, CMA         Sent: 09/17/2013  10:16 AM           To: Kandra Nicolas, MD            Jazzalynn states she is having nausea and vomiting with the Zpack and would like a different antibiotic called in. [facility], [address]       ------

## 2013-09-17 NOTE — ED Notes (Signed)
Kandra Nicolas, MD Stephanie Stone, [place]    Phone Number: [phone]             Discontinue azithromycin and begin Omnicef 300mg , one BID, Rx#20, no refill      Left message stating to stop Zpack and begin Omnicef. Medication has been e-prescribed to the pharmacy.

## 2013-10-02 ENCOUNTER — Ambulatory Visit (INDEPENDENT_AMBULATORY_CARE_PROVIDER_SITE_OTHER): Payer: Federal, State, Local not specified - PPO

## 2013-10-02 ENCOUNTER — Encounter: Payer: Self-pay | Admitting: Physician Assistant

## 2013-10-02 ENCOUNTER — Ambulatory Visit (INDEPENDENT_AMBULATORY_CARE_PROVIDER_SITE_OTHER): Payer: Federal, State, Local not specified - PPO | Admitting: Physician Assistant

## 2013-10-02 VITALS — BP 138/80 | HR 67 | Wt 142.0 lb

## 2013-10-02 DIAGNOSIS — R059 Cough, unspecified: Secondary | ICD-10-CM

## 2013-10-02 DIAGNOSIS — R0989 Other specified symptoms and signs involving the circulatory and respiratory systems: Secondary | ICD-10-CM

## 2013-10-02 DIAGNOSIS — J189 Pneumonia, unspecified organism: Secondary | ICD-10-CM

## 2013-10-02 DIAGNOSIS — R05 Cough: Secondary | ICD-10-CM

## 2013-10-02 NOTE — Progress Notes (Signed)
   Subjective:    Patient ID: Stephanie Stone, female    DOB: [birth year], 63 y.o.   MRN: [id number]  HPI Patient is a 63 year old female who follows up after pneumonia diagnosis on 09/17/2013. She was seen in urgent care and given Z-Pak. She had an intolerance to Z-Pak with GI side effects causing given Omnicef. She finished Omnicef 2 days ago. She is feeling much better after antibiotic. Her daughter is a nurse came over and was sent to her lungs and said she still hurts and stop in her lungs. Patient is concerned because she has had a history of pneumonia that would not resolve as well as some atypical chest x-ray presentations and infections. She definitely feels 90% better than she did after antibiotic. She still has a residual dry cough. She denies any fever, chills, body, nausea, vomiting or wheezing. She's not taken any pain to help with symptoms.      Review of Systems     Objective:   Physical Exam  Constitutional: She appears well-developed and well-nourished.  HENT:  Head: Normocephalic and atraumatic.  Cardiovascular: Normal rate, regular rhythm and normal heart sounds.   Pulmonary/Chest:  Pulse ox is 96%.  Right lung auscultation was normal with no wheezing or rhonchi heard.  Left lung there was a expiratory squeaking heard throughout lung.  Skin: Skin is dry.  Psychiatric: She has a normal mood and affect. Her behavior is normal.          Assessment & Plan:  CPAP followup/atypical sounds auscultated on lung exam-discuss with patient that I cannot really explain the noise I heard over the left lung. It sounded like a squeaky on expiration. Will get repeat chest x-ray today and call patient with results. Not going to treat cough today at this point seems to control and then a normal relationship to recovery pneumonia.

## 2014-04-19 ENCOUNTER — Encounter: Payer: Self-pay | Admitting: Cardiology

## 2014-04-27 ENCOUNTER — Other Ambulatory Visit: Payer: Self-pay | Admitting: Family Medicine

## 2014-04-29 ENCOUNTER — Other Ambulatory Visit: Payer: Self-pay | Admitting: Family Medicine

## 2014-09-13 ENCOUNTER — Encounter: Payer: Federal, State, Local not specified - PPO | Admitting: Family Medicine

## 2014-09-30 ENCOUNTER — Encounter: Payer: Self-pay | Admitting: Family Medicine

## 2014-09-30 ENCOUNTER — Ambulatory Visit (INDEPENDENT_AMBULATORY_CARE_PROVIDER_SITE_OTHER): Payer: Federal, State, Local not specified - PPO | Admitting: Family Medicine

## 2014-09-30 VITALS — BP 138/80 | HR 84 | Wt 144.0 lb

## 2014-09-30 DIAGNOSIS — E785 Hyperlipidemia, unspecified: Secondary | ICD-10-CM

## 2014-09-30 DIAGNOSIS — Z1231 Encounter for screening mammogram for malignant neoplasm of breast: Secondary | ICD-10-CM | POA: Diagnosis not present

## 2014-09-30 DIAGNOSIS — L819 Disorder of pigmentation, unspecified: Secondary | ICD-10-CM

## 2014-09-30 DIAGNOSIS — R030 Elevated blood-pressure reading, without diagnosis of hypertension: Secondary | ICD-10-CM

## 2014-09-30 DIAGNOSIS — N898 Other specified noninflammatory disorders of vagina: Secondary | ICD-10-CM

## 2014-09-30 DIAGNOSIS — R51 Headache: Secondary | ICD-10-CM

## 2014-09-30 DIAGNOSIS — Z114 Encounter for screening for human immunodeficiency virus [HIV]: Secondary | ICD-10-CM | POA: Diagnosis not present

## 2014-09-30 DIAGNOSIS — Z0000 Encounter for general adult medical examination without abnormal findings: Secondary | ICD-10-CM

## 2014-09-30 DIAGNOSIS — Z1159 Encounter for screening for other viral diseases: Secondary | ICD-10-CM

## 2014-09-30 DIAGNOSIS — K5901 Slow transit constipation: Secondary | ICD-10-CM

## 2014-09-30 DIAGNOSIS — L814 Other melanin hyperpigmentation: Secondary | ICD-10-CM

## 2014-09-30 DIAGNOSIS — R519 Headache, unspecified: Secondary | ICD-10-CM

## 2014-09-30 MED ORDER — FLUTICASONE PROPIONATE 50 MCG/ACT NA SUSP: 2.0000 | Freq: Every day | NASAL | Status: DC

## 2014-09-30 MED ORDER — ESTRADIOL 0.1 MG/GM VA CREA: 1.0000 | TOPICAL_CREAM | Freq: Every day | VAGINAL | Status: DC

## 2014-09-30 MED ORDER — LINACLOTIDE 145 MCG PO CAPS: 145.0000 ug | ORAL_CAPSULE | Freq: Every day | ORAL | Status: DC

## 2014-09-30 MED ORDER — AMOXICILLIN-POT CLAVULANATE 875-125 MG PO TABS: 1.0000 | ORAL_TABLET | Freq: Two times a day (BID) | ORAL | Status: DC

## 2014-09-30 NOTE — Progress Notes (Signed)
Subjective:     Stephanie Stone is a 64 y.o. female and is here for a comprehensive physical exam. The patient reports no problems. She does want to talk about constipation, vaginal dryness. Has been constipated even back to her teenage years. Has tried multiple OTC agents.  She has had excess vaginal dryness. Was on HRT for years and is now off. She doesn't want to restart this.   Also c/o of bilat facial pain anterior to ears but not over the TMJ. Worse when he lays on that side. Feels like pressure. Has been having more nasal sxs.  No fever, chills or severe congestion.    History   Social History  . Marital Status: Married    Spouse Name: N/A  . Number of Children: N/A  . Years of Education: N/A   Occupational History  . Not on file.   Social History Main Topics  . Smoking status: Never Smoker   . Smokeless tobacco: Never Used  . Alcohol Use: No  . Drug Use: No  . Sexual Activity: Not on file   Other Topics Concern  . Not on file   Social History Narrative   No regular exercise.    Health Maintenance  Topic Date Due  . Hepatitis C Screening  Mar 09, 1951  . HIV Screening  04/27/1966  . ZOSTAVAX  04/28/2011  . MAMMOGRAM  02/22/2013  . INFLUENZA VACCINE  02/10/2015  . COLONOSCOPY  02/08/2016  . TETANUS/TDAP  09/09/2021    The following portions of the patient's history were reviewed and updated as appropriate: allergies, current medications, past family history, past medical history, past social history, past surgical history and problem list.  Review of Systems A comprehensive review of systems was negative.   Objective:    BP 138/80 mmHg  Pulse 84  Wt 144 lb (65.318 kg) General appearance: alert, cooperative and appears stated age Head: Normocephalic, without obvious abnormality, atraumatic Eyes: conj clear, EOMI, PEERLA Ears: normal TM's and external ear canals both ears Nose: Nares normal. Septum midline. Mucosa normal. No drainage or sinus tenderness.  turbinates are midly swollen.  Throat: lips, mucosa, and tongue normal; teeth and gums normal Neck: no adenopathy, no carotid bruit, no JVD, supple, symmetrical, trachea midline and thyroid not enlarged, symmetric, no tenderness/mass/nodules Back: symmetric, no curvature. ROM normal. No CVA tenderness. Lungs: clear to auscultation bilaterally Breasts: normal appearance, no masses or tenderness Heart: regular rate and rhythm, S1, S2 normal, no murmur, click, rub or gallop Abdomen: soft, non-tender; bowel sounds normal; no masses,  no organomegaly Extremities: extremities normal, atraumatic, no cyanosis or edema Pulses: 2+ and symmetric Skin: Skin color, texture, turgor normal. No rashes or lesions Lymph nodes: Cervical, supraclavicular, and axillary nodes normal. Neurologic: Alert and oriented X 3, normal strength and tone. Normal symmetric reflexes. Normal coordination and gait    Assessment:    Healthy female exam.      Plan:     See After Visit Summary for Counseling Recommendations   Keep up a regular exercise program and make sure you are eating a healthy diet Try to eat 4 servings of dairy a day, or if you are lactose intolerant take a calcium with vitamin D daily.  Your vaccines are up to date.   Constipation - Recommend trial of linzess.   Vaginal dryness - Recommend trial of topical estrogen/ she was on HRT for years and doesn't want to restart oral HRT.  Start nightly and after 2 weeks can dec to 2-3  times per week.    bilat facial pain - may be allergic vs early sinsiits. Recommend trial of nasal steroid.  Start flonase.   If not feeling better by the end of the week then ok to fill the augmentin. Can fill sooner if pain worsens or develops a fever.

## 2014-10-01 DIAGNOSIS — K5901 Slow transit constipation: Secondary | ICD-10-CM | POA: Insufficient documentation

## 2014-10-08 LAB — COMPLETE METABOLIC PANEL WITH GFR
ALT: 14 U/L (ref 0–35)
AST: 18 U/L (ref 0–37)
Albumin: 4.7 g/dL (ref 3.5–5.2)
Alkaline Phosphatase: 100 U/L (ref 39–117)
BILIRUBIN TOTAL: 0.4 mg/dL (ref 0.2–1.2)
BUN: 18 mg/dL (ref 6–23)
CO2: 24 mEq/L (ref 19–32)
CREATININE: 0.97 mg/dL (ref 0.50–1.10)
Calcium: 10 mg/dL (ref 8.4–10.5)
Chloride: 105 mEq/L (ref 96–112)
GFR, EST AFRICAN AMERICAN: 72 mL/min
GFR, EST NON AFRICAN AMERICAN: 62 mL/min
Glucose, Bld: 84 mg/dL (ref 70–99)
POTASSIUM: 4.8 meq/L (ref 3.5–5.3)
SODIUM: 141 meq/L (ref 135–145)
TOTAL PROTEIN: 6.9 g/dL (ref 6.0–8.3)

## 2014-10-08 LAB — LIPID PANEL
Cholesterol: 229 mg/dL — ABNORMAL HIGH (ref 0–200)
HDL: 68 mg/dL (ref >=46)
LDL CALC: 147 mg/dL — AB (ref 0–99)
Total CHOL/HDL Ratio: 3.4 Ratio
Triglycerides: 71 mg/dL (ref <150)
VLDL: 14 mg/dL (ref 0–40)

## 2014-10-08 LAB — HEPATITIS C ANTIBODY: HCV AB: NEGATIVE

## 2014-10-08 LAB — HIV ANTIBODY (ROUTINE TESTING W REFLEX): HIV 1&2 Ab, 4th Generation: NONREACTIVE

## 2014-10-28 ENCOUNTER — Ambulatory Visit (HOSPITAL_BASED_OUTPATIENT_CLINIC_OR_DEPARTMENT_OTHER)
Admission: RE | Admit: 2014-10-28 | Discharge: 2014-10-28 | Disposition: A | Discharge status: Home / Self Care | Payer: Federal, State, Local not specified - PPO | Source: Ambulatory Visit | Attending: Family Medicine | Admitting: Family Medicine

## 2014-10-28 DIAGNOSIS — Z1231 Encounter for screening mammogram for malignant neoplasm of breast: Secondary | ICD-10-CM | POA: Diagnosis not present

## 2014-10-28 DIAGNOSIS — Z0000 Encounter for general adult medical examination without abnormal findings: Secondary | ICD-10-CM

## 2014-12-30 ENCOUNTER — Ambulatory Visit: Payer: Federal, State, Local not specified - PPO | Admitting: Family Medicine

## 2015-01-20 ENCOUNTER — Ambulatory Visit (INDEPENDENT_AMBULATORY_CARE_PROVIDER_SITE_OTHER): Payer: Federal, State, Local not specified - PPO | Admitting: Family Medicine

## 2015-01-20 VITALS — BP 131/84 | HR 73 | Temp 97.6°F | Wt 141.0 lb

## 2015-01-20 DIAGNOSIS — R300 Dysuria: Secondary | ICD-10-CM | POA: Diagnosis not present

## 2015-01-20 LAB — POCT URINALYSIS DIPSTICK
Bilirubin, UA: NEGATIVE
GLUCOSE UA: NEGATIVE
Ketones, UA: NEGATIVE
NITRITE UA: NEGATIVE
PH UA: 5.5
Spec Grav, UA: 1.02
Urobilinogen, UA: 0.2

## 2015-01-20 MED ORDER — CIPROFLOXACIN HCL 500 MG PO TABS: 500.0000 mg | ORAL_TABLET | Freq: Two times a day (BID) | ORAL | Status: DC

## 2015-01-20 NOTE — Progress Notes (Signed)
Pt notified of new Rx, order or urine culture cancelled. Pt advised to contact clinic if she has any questions/concerns. Verbalized understanding.

## 2015-01-20 NOTE — Addendum Note (Signed)
Addended by: Huel Cote on: 01/20/2015 11:47 AM   Modules accepted: Orders

## 2015-01-20 NOTE — Progress Notes (Signed)
   Subjective:    Patient ID: Stephanie Stone, female    DOB: April 10, 1951, 64 y.o.   MRN: [id number]  HPI  Patient came into clinic today for possible urinary tract infection. Pt reports increase in frequency and burning while urinating for the past 24 hours. Pt states it has not gotten to "sharp pain" yet but based on past UTI's she wanted to prevent it. Patient reports no fever and has not taken anything OTC to help her symptoms because she did not wants to "create an error in our testing."  Review of Systems     Objective:   Physical Exam        Assessment & Plan:  Patient was able to provide a sample today in office. This will be sent to lab for urine culture as well as a POCT Urine Dipstick. Pharmacy and allergies were reviewed prior to Pt leaving office today. Advised we would contact her with results and plan of care. Verbalized understanding.   UTI - UA shows blood and leuks. Will treat for UTI. Sent rx for Cipro.  Call if not better in one week. No culture needed.   Beatrice Lecher, MD

## 2015-01-23 ENCOUNTER — Ambulatory Visit (INDEPENDENT_AMBULATORY_CARE_PROVIDER_SITE_OTHER): Payer: Federal, State, Local not specified - PPO | Admitting: Family Medicine

## 2015-01-23 ENCOUNTER — Encounter: Payer: Self-pay | Admitting: Family Medicine

## 2015-01-23 VITALS — BP 115/67 | HR 75 | Ht 62.5 in | Wt 141.0 lb

## 2015-01-23 DIAGNOSIS — J3489 Other specified disorders of nose and nasal sinuses: Secondary | ICD-10-CM

## 2015-01-23 DIAGNOSIS — K5901 Slow transit constipation: Secondary | ICD-10-CM

## 2015-01-23 DIAGNOSIS — J309 Allergic rhinitis, unspecified: Secondary | ICD-10-CM | POA: Diagnosis not present

## 2015-01-23 DIAGNOSIS — N952 Postmenopausal atrophic vaginitis: Secondary | ICD-10-CM | POA: Diagnosis not present

## 2015-01-23 NOTE — Progress Notes (Signed)
   Subjective:    Patient ID: Stephanie Stone, female    DOB: May 18, 1951, 64 y.o.   MRN: [id number]  HPI Constipation - she has been using it PRN the linzess,  Working well.  Says not taking it consistantly.    Vaginal atrophy - Didn't start the vaginal estrogen cream.    Sinus pain - says the nasal steroid spray is working well.  Just wants refills.   Review of Systems     Objective:   Physical Exam  Constitutional: She is oriented to person, place, and time. She appears well-developed and well-nourished.  Abdominal: Soft. Bowel sounds are normal. She exhibits no mass. There is tenderness. There is no rebound and no guarding.  Mildly tender in the LLQ.   Neurological: She is alert and oriented to person, place, and time.  Psychiatric: She has a normal mood and affect. Her behavior is normal.          Assessment & Plan:  Constipation - will continue Linzess - working well.   Vaginal atrophy - just discussed  Treatment with the estrogen cream  Sinus pressure/AR - resolved on the nasal steroid.

## 2015-04-07 ENCOUNTER — Ambulatory Visit: Payer: Federal, State, Local not specified - PPO | Admitting: Osteopathic Medicine

## 2015-04-07 ENCOUNTER — Encounter: Payer: Self-pay | Admitting: Family Medicine

## 2015-04-07 ENCOUNTER — Ambulatory Visit (INDEPENDENT_AMBULATORY_CARE_PROVIDER_SITE_OTHER): Payer: Federal, State, Local not specified - PPO | Admitting: Family Medicine

## 2015-04-07 VITALS — BP 147/82 | HR 86 | Temp 98.1°F | Ht 62.5 in | Wt 142.0 lb

## 2015-04-07 DIAGNOSIS — R05 Cough: Secondary | ICD-10-CM | POA: Diagnosis not present

## 2015-04-07 DIAGNOSIS — J209 Acute bronchitis, unspecified: Secondary | ICD-10-CM | POA: Diagnosis not present

## 2015-04-07 DIAGNOSIS — J0100 Acute maxillary sinusitis, unspecified: Secondary | ICD-10-CM

## 2015-04-07 DIAGNOSIS — R059 Cough, unspecified: Secondary | ICD-10-CM

## 2015-04-07 MED ORDER — ALBUTEROL SULFATE (2.5 MG/3ML) 0.083% IN NEBU
2.5000 mg | INHALATION_SOLUTION | Freq: Once | RESPIRATORY_TRACT | Status: AC
  Administered 2015-04-07: 2.5 mg via RESPIRATORY_TRACT

## 2015-04-07 MED ORDER — AMOXICILLIN-POT CLAVULANATE 875-125 MG PO TABS: 1.0000 | ORAL_TABLET | Freq: Two times a day (BID) | ORAL | Status: DC

## 2015-04-07 MED ORDER — HYDROCODONE-HOMATROPINE 5-1.5 MG/5ML PO SYRP: 5.0000 mL | ORAL_SOLUTION | Freq: Every evening | ORAL | Status: DC | PRN

## 2015-04-07 NOTE — Progress Notes (Signed)
   Subjective:    Patient ID: Stephanie Stone, female    DOB: [birth year], 64 y.o.   MRN: [id number]  HPI patient comes in today complaining of not feeling well for 1 week. She mostly describes a dry cough and headache and just not feeling well with some body aches and pains. No nausea or vomiting. No significant mucus from her lungs but some from her nose. She is having some pain in the posterior right lung area where she has had problems before and she is concerned about that today.  Has has some sins congestion and left ear pain. Bilateral maxillary pain.  Slept for about 3 days.  Using with robitussion DM but not really hleping. Cough is keeping her awake all night.   Review of Systems     Objective:   Physical Exam  Constitutional: She is oriented to person, place, and time. She appears well-developed and well-nourished.  HENT:  Head: Normocephalic and atraumatic.  Right Ear: External ear normal.  Left Ear: External ear normal.  Nose: Nose normal.  Mouth/Throat: Oropharynx is clear and moist.  TMs and canals are clear.   Eyes: Conjunctivae and EOM are normal. Pupils are equal, round, and reactive to light.  Neck: Neck supple. No thyromegaly present.  Cardiovascular: Normal rate, regular rhythm and normal heart sounds.   Pulmonary/Chest: Effort normal and breath sounds normal. She has no wheezes.  Diffuse slight ronchi bilaterally.  No wheezing  Lymphadenopathy:    She has no cervical adenopathy.  Neurological: She is alert and oriented to person, place, and time.  Skin: Skin is warm and dry.  Psychiatric: She has a normal mood and affect.          Assessment & Plan:  Acute sinusitis/bronchitis-could still be viral but this point she's been sick for a week is not improving. Plan treat with Augmentin for her sinus infection. She is a little bit of rhonchi in the lungs. She is otherwise afebrile and oxygen level looks great. Call if she feels that she's getting worse or just not  improving.

## 2015-04-26 ENCOUNTER — Emergency Department
Admission: EM | Admit: 2015-04-26 | Discharge: 2015-04-26 | Disposition: A | Discharge status: Home / Self Care | Payer: Federal, State, Local not specified - PPO | Source: Home / Self Care | Attending: Family Medicine | Admitting: Family Medicine

## 2015-04-26 ENCOUNTER — Encounter: Payer: Self-pay | Admitting: Emergency Medicine

## 2015-04-26 DIAGNOSIS — R300 Dysuria: Secondary | ICD-10-CM

## 2015-04-26 MED ORDER — NITROFURANTOIN MONOHYD MACRO 100 MG PO CAPS: 100.0000 mg | ORAL_CAPSULE | Freq: Two times a day (BID) | ORAL | Status: DC

## 2015-04-26 NOTE — ED Notes (Signed)
Pt c/o UTI sxs

## 2015-04-26 NOTE — ED Provider Notes (Signed)
CSN: [id number]     Arrival date & time 04/26/15  1012 History   First MD Initiated Contact with Patient 04/26/15 1045     Chief Complaint  Patient presents with  . Urinary Tract Infection      HPI Comments: Patient awoke yesterday with urinary urgency, dysuria, and hesitancy.  She feels well otherwise.  No fevers, chills, and sweats.  No abdominal or pelvic pain.  She states that she had finished a course of amoxicillin about one week ago for a sinus infection.  Patient is a 64 y.o. female presenting with dysuria. The history is provided by the patient.  Dysuria Pain quality:  Burning Pain severity:  Mild Onset quality:  Sudden Duration:  1 day Timing:  Constant Progression:  Unchanged Chronicity:  New Recent urinary tract infections: no   Relieved by:  Phenazopyridine Urinary symptoms: frequent urination and hesitancy   Urinary symptoms: no discolored urine, no foul-smelling urine, no hematuria and no bladder incontinence   Associated symptoms: no abdominal pain, no fever, no flank pain, no genital lesions, no nausea, no vaginal discharge and no vomiting     Past Medical History  Diagnosis Date  . Lung infiltrate 2010    Resolved on its own without biopsy  . MVP (mitral valve prolapse)   . Hyperlipidemia   . Depression   . Hypertension   . Alopecia     areata  . Migraines   . Menopause, premature    Past Surgical History  Procedure Laterality Date  . Appendectomy    . Abdominal hysterectomy  1979    endometriosis   Family History  Problem Relation Age of Onset  . Heart attack Father   . Heart attack Mother   . Hypertension Mother   . Coronary artery disease Mother 53  . Liver cancer Mother   . Heart attack Brother 66   Social History  Substance Use Topics  . Smoking status: Never Smoker   . Smokeless tobacco: Never Used  . Alcohol Use: No   OB History    No data available     Review of Systems  Constitutional: Negative for fever.  Gastrointestinal:  Negative for nausea, vomiting and abdominal pain.  Genitourinary: Positive for dysuria. Negative for flank pain and vaginal discharge.  All other systems reviewed and are negative.   Allergies  Azithromycin; Codeine; Lovastatin; Miconazole nitrate; and Sulfur  Home Medications   Prior to Admission medications   Medication Sig Start Date End Date Taking? Authorizing Provider  amoxicillin-clavulanate (AUGMENTIN) 875-125 MG per tablet Take 1 tablet by mouth 2 (two) times daily. 04/07/15   [name], MD  aspirin 81 MG EC tablet Take 81 mg by mouth daily.      Historical Provider, MD  eletriptan (RELPAX) 20 MG tablet One tablet by mouth at onset of headache. May repeat in 2 hours if headache persists or recurs. may repeat in 2 hours if necessary 11/21/12   [name], MD  estradiol (ESTRACE) 0.1 MG/GM vaginal cream Place 1 Applicatorful vaginally at bedtime. After 2 weeks can decrease to 3 times per week. Patient not taking: Reported on 01/23/2015 09/30/14   [name], MD  fluticasone [facility]) 50 MCG/ACT nasal spray Place 2 sprays into both nostrils daily. 09/30/14   [name], MD  HYDROcodone-homatropine [facility]) 5-1.5 MG/5ML syrup Take 5 mLs by mouth at bedtime as needed for cough. 04/07/15   [name], MD  Linaclotide [facility]) 145 MCG CAPS capsule Take  1 capsule (145 mcg total) by mouth daily. 09/30/14   [name], MD  nitrofurantoin, macrocrystal-monohydrate, (MACROBID) 100 MG capsule Take 1 capsule (100 mg total) by mouth 2 (two) times daily. Take with food. 04/26/15   Kandra Nicolas, MD   Meds Ordered and Administered this Visit  Medications - No data to display  BP 119/73 mmHg  Pulse 76  Temp(Src) 98.3 F (36.8 C) (Oral)  Ht 5\' 2"  (1.575 m)  Wt 141 lb (63.957 kg)  BMI 25.78 kg/m2  SpO2 97% No data found.   Physical Exam Nursing notes and Vital Signs reviewed. Appearance:  Patient appears stated age, and in no acute  distress Eyes:  Pupils are equal, round, and reactive to light and accomodation.  Extraocular movement is intact.  Conjunctivae are not inflamed   Nose:  Normal Pharynx:  Normal Neck:  Supple.  No adenopathy Lungs:  Clear to auscultation.  Breath sounds are equal.  Moving air well. Heart:  Regular rate and rhythm without murmurs, rubs, or gallops.  Abdomen:  Nontender without masses or hepatosplenomegaly.  Bowel sounds are present.  No CVA or flank tenderness.  Extremities:  No edema.    Skin:  No rash present.   ED Course  Procedures none   Labs Reviewed  URINE CULTURE     MDM   1. Dysuria; suspect cystitis   Urine culture pending. Begin Macrobid 100mg  BID for one week. Increase fluid intake. If symptoms become significantly worse during the night or over the weekend, proceed to the local emergency room.  Followup with Family Doctor if not improved in one week.     Kandra Nicolas, MD 04/26/15 [id number]

## 2015-04-26 NOTE — Discharge Instructions (Signed)
Increase fluid intake.  May use non-prescription AZO for about two days, if desired, to decrease urinary discomfort.  If symptoms become significantly worse during the night or over the weekend, proceed to the local emergency room.    Dysuria Dysuria is pain or discomfort while urinating. The pain or discomfort may be felt in the tube that carries urine out of the bladder (urethra) or in the surrounding tissue of the genitals. The pain may also be felt in the groin area, lower abdomen, and lower back. You may have to urinate frequently or have the sudden feeling that you have to urinate (urgency). Dysuria can affect both men and women, but is more common in women. Dysuria can be caused by many different things, including:  Urinary tract infection in women.  Infection of the kidney or bladder.  Kidney stones or bladder stones.  Certain sexually transmitted infections (STIs), such as chlamydia.  Dehydration.  Inflammation of the vagina.  Use of certain medicines.  Use of certain soaps or scented products that cause irritation. HOME CARE INSTRUCTIONS Watch your dysuria for any changes. The following actions may help to reduce any discomfort you are feeling:  Drink enough fluid to keep your urine clear or pale yellow.  Empty your bladder often. Avoid holding urine for long periods of time.  After a bowel movement or urination, women should cleanse from front to back, using each tissue only once.  Empty your bladder after sexual intercourse.  Take medicines only as directed by your health care provider.  If you were prescribed an antibiotic medicine, finish it all even if you start to feel better.  Avoid caffeine, tea, and alcohol. They can irritate the bladder and make dysuria worse. In men, alcohol may irritate the prostate.  Keep all follow-up visits as directed by your health care provider. This is important.  If you had any tests done to find the cause of dysuria, it is your  responsibility to obtain your test results. Ask the lab or department performing the test when and how you will get your results. Talk with your health care provider if you have any questions about your results. SEEK MEDICAL CARE IF:  You develop pain in your back or sides.  You have a fever.  You have nausea or vomiting.  You have blood in your urine.  You are not urinating as often as you usually do. SEEK IMMEDIATE MEDICAL CARE IF:  You pain is severe and not relieved with medicines.  You are unable to hold down any fluids.  You or someone else notices a change in your mental function.  You have a rapid heartbeat at rest.  You have shaking or chills.  You feel extremely weak.   This information is not intended to replace advice given to you by your health care provider. Make sure you discuss any questions you have with your health care provider.   Document Released: 03/26/2004 Document Revised: 07/19/2014 Document Reviewed: 02/21/2014 Elsevier Interactive Patient Education [employer].

## 2015-04-28 ENCOUNTER — Telehealth: Payer: Self-pay | Admitting: Emergency Medicine

## 2015-04-28 LAB — URINE CULTURE

## 2015-06-16 ENCOUNTER — Ambulatory Visit: Payer: Federal, State, Local not specified - PPO

## 2015-06-26 ENCOUNTER — Ambulatory Visit: Payer: Federal, State, Local not specified - PPO

## 2015-10-07 ENCOUNTER — Ambulatory Visit (INDEPENDENT_AMBULATORY_CARE_PROVIDER_SITE_OTHER): Payer: Federal, State, Local not specified - PPO | Admitting: Family Medicine

## 2015-10-07 ENCOUNTER — Encounter: Payer: Self-pay | Admitting: Family Medicine

## 2015-10-07 VITALS — BP 136/69 | HR 76 | Wt 147.0 lb

## 2015-10-07 DIAGNOSIS — R42 Dizziness and giddiness: Secondary | ICD-10-CM

## 2015-10-07 DIAGNOSIS — F430 Acute stress reaction: Secondary | ICD-10-CM

## 2015-10-07 DIAGNOSIS — H9203 Otalgia, bilateral: Secondary | ICD-10-CM

## 2015-10-07 MED ORDER — FLUOXETINE HCL 20 MG PO TABS: 20.0000 mg | ORAL_TABLET | Freq: Every day | ORAL | Status: DC

## 2015-10-07 MED ORDER — FLUTICASONE PROPIONATE 50 MCG/ACT NA SUSP: 2.0000 | Freq: Every day | NASAL | Status: DC

## 2015-10-07 NOTE — Patient Instructions (Signed)
You should Flonase daily, 2 squirts in each nostril for the next 2 weeks You can add a decongestant the morning as well and see if this helps relieve the pain and pressure. Please call us back if you're getting worse, develops a fever, or get any drainage from the ears.

## 2015-10-07 NOTE — Progress Notes (Addendum)
   Subjective:    Patient ID: Stephanie Stone, female    DOB: Jan 27, 1951, 65 y.o.   MRN: [id number]  HPI Patient comes in complaining of ear pain and dizziness for approximately one week. She denies any recent upper respiratory infections or fevers etc. but she thinks may have triggered it. She said she just get a brief sharp pain in one of her ears. It seems to alternate. Doesn't happen at the same time. She doesn't have any significant hearing change or drainage from the ears. She's also had very brief episodes of dizziness. This happened about 3 times in the last week. She says it's very brief but one of the episodes was while she was driving so, it scared her. She denies any prior history of springtime allergies. Infectious actually had allergy testing. She does have reaction to cleaning chemicals that and since retiring she has been cleaning her own house again. She occasionally uses a nasal steroid spray but not regularly.   She also wants to restart her fluoxetine. Her adult son has moved back in with them for a period of time it has been more stressful. She's noticed her irritability levels have been weight and she wants to get back on the medication.  Review of Systems     Objective:   Physical Exam  Constitutional: She is oriented to person, place, and time. She appears well-developed and well-nourished.  HENT:  Head: Normocephalic and atraumatic.  Right Ear: External ear normal.  Left Ear: External ear normal.  Nose: Nose normal.  Mouth/Throat: Oropharynx is clear and moist.  TMs and canals are clear.   Eyes: Conjunctivae and EOM are normal. Pupils are equal, round, and reactive to light.  Neck: Neck supple. No thyromegaly present.  Cardiovascular: Normal rate, regular rhythm and normal heart sounds.   Pulmonary/Chest: Effort normal and breath sounds normal. She has no wheezes.  Lymphadenopathy:    She has no cervical adenopathy.  Neurological: She is alert and oriented to person,  place, and time. No cranial nerve deficit. She exhibits normal muscle tone. Coordination normal.  Negative Dix-Hallpike maneuver  Skin: Skin is warm and dry.  Psychiatric: She has a normal mood and affect.          Assessment & Plan:  Dizziness and ear pain most likely related to eustachian tube dysfunction. She does have some positive pressure behind the left ear but no sign of acute infection. Recommend daily use of a nasal steroid spray for at least a couple weeks to see if this relieves her symptoms and addition to a decongestant if she tolerates this well. If she's not improving, suddenly gets worse, develops more persistent pain, or gets any drainage from the ears or runs a fever them please come back in.  Acute situational stressor-will restart fluoxetine. Hopefully this will be short-term may be 6-12 months. New perception sent to the pharmacy. She'll need to follow back up in 6 months.

## 2015-10-07 NOTE — Addendum Note (Signed)
Addended by: Beatrice Lecher D on: 10/07/2015 09:55 AM   Modules accepted: Orders

## 2015-10-14 ENCOUNTER — Encounter: Payer: Self-pay | Admitting: Family Medicine

## 2015-11-06 ENCOUNTER — Ambulatory Visit (INDEPENDENT_AMBULATORY_CARE_PROVIDER_SITE_OTHER): Payer: Federal, State, Local not specified - PPO | Admitting: Osteopathic Medicine

## 2015-11-06 ENCOUNTER — Encounter: Payer: Self-pay | Admitting: Osteopathic Medicine

## 2015-11-06 VITALS — BP 146/87 | HR 73 | Temp 98.1°F | Ht 62.0 in | Wt 145.0 lb

## 2015-11-06 DIAGNOSIS — J019 Acute sinusitis, unspecified: Secondary | ICD-10-CM

## 2015-11-06 DIAGNOSIS — H6982 Other specified disorders of Eustachian tube, left ear: Secondary | ICD-10-CM | POA: Diagnosis not present

## 2015-11-06 MED ORDER — AMOXICILLIN-POT CLAVULANATE 875-125 MG PO TABS: 1.0000 | ORAL_TABLET | Freq: Two times a day (BID) | ORAL | Status: DC

## 2015-11-06 NOTE — Progress Notes (Signed)
HPI: Stephanie Stone is a 65 y.o. female who presents to [facility] today for chief complaint of:  Chief Complaint  Patient presents with  . Ear Pain    LEFT    Left ear pain . Location: Just anterior to left ear, patient states pain is a bit deep . Quality: Sore/sharp . Duration: Ongoing for maybe a few weeks . Context: Patient seen by PCP 10/07/2015 for ear pain and dizziness, denies upper respiratory infection/allergies. At that time was diagnosed with most likely eustachian tube dysfunction and nasal steroid spray was initiated. Patient states that she is not really doing much better. . Assoc signs/symptoms: No fever or chills, no headache or vision change, no pain with jaw movement, no difficulty swallowing and no sore throat, she does have some questionable sinus pressure on the left side   Past medical, social and family history reviewed: Past Medical History  Diagnosis Date  . Lung infiltrate 2010    Resolved on its own without biopsy  . MVP (mitral valve prolapse)   . Hyperlipidemia   . Depression   . Hypertension   . Alopecia     areata  . Migraines   . Menopause, premature    Past Surgical History  Procedure Laterality Date  . Appendectomy    . Abdominal hysterectomy  1979    endometriosis   Social History  Substance Use Topics  . Smoking status: Never Smoker   . Smokeless tobacco: Never Used  . Alcohol Use: No   Family History  Problem Relation Age of Onset  . Heart attack Father   . Heart attack Mother   . Hypertension Mother   . Coronary artery disease Mother 49  . Liver cancer Mother   . Heart attack Brother 50    Current Outpatient Prescriptions  Medication Sig Dispense Refill  . aspirin 81 MG EC tablet Take 81 mg by mouth daily.      Marland Kitchen eletriptan (RELPAX) 20 MG tablet One tablet by mouth at onset of headache. May repeat in 2 hours if headache persists or recurs. may repeat in 2 hours if necessary 10 tablet 3  .  estradiol (ESTRACE) 0.1 MG/GM vaginal cream Place 1 Applicatorful vaginally at bedtime. After 2 weeks can decrease to 3 times per week. 42.5 g 12  . FLUoxetine (PROZAC) 20 MG tablet Take 1 tablet (20 mg total) by mouth daily. 90 tablet 1  . fluticasone (FLONASE) 50 MCG/ACT nasal spray Place 2 sprays into both nostrils daily. 16 g 6  . Linaclotide (LINZESS) 145 MCG CAPS capsule Take 1 capsule (145 mcg total) by mouth daily. 30 capsule 11  . amoxicillin-clavulanate (AUGMENTIN) 875-125 MG tablet Take 1 tablet by mouth 2 (two) times daily. 10 tablet 0   No current facility-administered medications for this visit.   Allergies  Allergen Reactions  . Azithromycin Nausea Only  . Codeine     REACTION: NAUSEA  . Lovastatin   . Miconazole Nitrate     REACTION: Nausea  . Sulfur       Review of Systems: CONSTITUTIONAL:  No  fever, no chills HEAD/EYES/EARS/NOSE/THROAT: No  headache, no vision change, no hearing change, No  sore throat, (+) L sinus pressure, ear pain as per history of present illness CARDIAC: No  chest pain RESPIRATORY: No  cough, No  shortness of breath/wheeze GASTROINTESTINAL: No  nausea, No  vomiting, No  abdominal pain,  MUSCULOSKELETAL: No  myalgia/arthralgiaOf TMJ SKIN: No  rash/wounds/concerning lesions NEUROLOGIC:  No  weakness, No  dizziness, No  slurred speech  Exam:  BP 146/87 mmHg  Pulse 73  Temp(Src) 98.1 F (36.7 C) (Oral)  Ht 5\' 2"  (1.575 m)  Wt 145 lb (65.772 kg)  BMI 26.51 kg/m2 Constitutional: VS see above. General Appearance: alert, well-developed, well-nourished, NAD Eyes: Normal lids and conjunctive, non-icteric sclera Ears, Nose, Mouth, Throat: MMM, Normal external inspection ears/nares/mouth/lips/gums, TM normal bilaterally, the patient is somewhat tender when the otoscope speculum is inserted into the left ear although canal appears normal. Pharynx no erythema, no exudate. Nasal mucosa normal Neck: No masses, trachea midline. No thyroid  enlargement/tenderness/mass appreciated. No lymphadenopathy Respiratory: Normal respiratory effort.       ASSESSMENT/PLAN: Continue Flonase for eustachian tube dysfunction, will trial a course of antibiotics as this may be referred pain from sinusitis given the patient's symptoms of sinus pressure. If these measures are not helping, would recommend follow-up with PCP to discuss possible imaging versus ENT referral. ER/RTC precautions reviewed, her pain is really not in the anatomic distribution or of the character of pain that I'm worried for something like TMJ or temporal arteritis but precautions for these things were reviewed with the patient as well.  Eustachian tube dysfunction, left  Acute sinusitis, recurrence not specified, unspecified location - Plan: amoxicillin-clavulanate (AUGMENTIN) 875-125 MG tablet   All questions were answered. Visit summary with updated medication list and pertinent instructions was printed for patient. ER/RTC precautions were reviewed with the patient. Return if symptoms worsen or fail to improve, and as directed by your PCP for routine care.

## 2015-11-06 NOTE — Patient Instructions (Signed)
EAR PAIN - will treat as sinusitis, if your pain persists or gets worse,please make an appointment with Dr. Madilyn Fireman to discuss further workup.  Emergency symptoms to watch out for: hearing loss, severe headache, vision changes, rash, fever, any other concerning problems - seek care ASAP.   Earache An earache, also called otalgia, can be caused by many things. Pain from an earache can be sharp, dull, or burning. The pain may be temporary or constant. Earaches can be caused by problems with the ear, such as infection in either the middle ear or the ear canal, injury, impacted ear wax, middle ear pressure, or a foreign body in the ear. Ear pain can also result from problems in other areas. This is called referred pain. For example, pain can come from a sore throat, a tooth infection, or problems with the jaw or the joint between the jaw and the skull (temporomandibular joint, or TMJ). The cause of an earache is not always easy to identify. Watchful waiting may be appropriate for some earaches until a clear cause of the pain can be found. HOME CARE INSTRUCTIONS Watch your condition for any changes. The following actions may help to lessen any discomfort that you are feeling:  Take medicines only as directed by your health care provider. This includes ear drops.  Apply ice to your outer ear to help reduce pain.  Put ice in a plastic bag.  Place a towel between your skin and the bag.  Leave the ice on for 20 minutes, 2-3 times per day.  Do not put anything in your ear other than medicine that is prescribed by your health care provider.  Try resting in an upright position instead of lying down. This may help to reduce pressure in the middle ear and relieve pain.  Chew gum if it helps to relieve your ear pain.  Control any allergies that you have.  Keep all follow-up visits as directed by your health care provider. This is important. SEEK MEDICAL CARE IF:  You have a fever.  You have new or  worsening symptoms. SEEK IMMEDIATE MEDICAL CARE IF:  You have a severe headache.  You have a stiff neck.  You have difficulty swallowing.  You have redness or swelling behind your ear.  You have drainage from your ear.  You have hearing loss.  You feel dizzy.   This information is not intended to replace advice given to you by your health care provider. Make sure you discuss any questions you have with your health care provider.   Document Released: 02/13/2004 Document Revised: 07/19/2014 Document Reviewed: 01/27/2014 Elsevier Interactive Patient Education [employer].

## 2015-12-12 ENCOUNTER — Ambulatory Visit (INDEPENDENT_AMBULATORY_CARE_PROVIDER_SITE_OTHER): Payer: Federal, State, Local not specified - PPO | Admitting: Family Medicine

## 2015-12-12 VITALS — BP 138/85 | HR 70 | Temp 98.1°F | Wt 145.0 lb

## 2015-12-12 DIAGNOSIS — Z23 Encounter for immunization: Secondary | ICD-10-CM

## 2015-12-12 NOTE — Progress Notes (Signed)
Patient came into clinic today for her Tdap vaccine. Pt reports her daughter-in-law is pregnant and requested the family be up-to-date on their immunizations. Pt tolerated injection in Left deltoid well, no immediate complications. Pt advised to contact clinic should any questions arise, verbalized understanding.    Agree with above.   Stephanie Lecher, MD

## 2016-01-07 ENCOUNTER — Other Ambulatory Visit: Payer: Self-pay | Admitting: Family Medicine

## 2016-01-07 DIAGNOSIS — Z1231 Encounter for screening mammogram for malignant neoplasm of breast: Secondary | ICD-10-CM

## 2016-01-15 ENCOUNTER — Ambulatory Visit (HOSPITAL_BASED_OUTPATIENT_CLINIC_OR_DEPARTMENT_OTHER)
Admission: RE | Admit: 2016-01-15 | Discharge: 2016-01-15 | Disposition: A | Discharge status: Home / Self Care | Payer: Federal, State, Local not specified - PPO | Source: Ambulatory Visit | Attending: Family Medicine | Admitting: Family Medicine

## 2016-01-15 DIAGNOSIS — Z1231 Encounter for screening mammogram for malignant neoplasm of breast: Secondary | ICD-10-CM | POA: Diagnosis not present

## 2016-03-30 ENCOUNTER — Ambulatory Visit (INDEPENDENT_AMBULATORY_CARE_PROVIDER_SITE_OTHER): Payer: Federal, State, Local not specified - PPO | Admitting: Family Medicine

## 2016-03-30 ENCOUNTER — Encounter: Payer: Self-pay | Admitting: Family Medicine

## 2016-03-30 VITALS — BP 118/72 | HR 96 | Temp 97.9°F | Ht 62.0 in | Wt 147.0 lb

## 2016-03-30 DIAGNOSIS — N3000 Acute cystitis without hematuria: Secondary | ICD-10-CM | POA: Diagnosis not present

## 2016-03-30 DIAGNOSIS — R350 Frequency of micturition: Secondary | ICD-10-CM | POA: Diagnosis not present

## 2016-03-30 DIAGNOSIS — R81 Glycosuria: Secondary | ICD-10-CM | POA: Diagnosis not present

## 2016-03-30 LAB — POCT URINALYSIS DIPSTICK
BILIRUBIN UA: NEGATIVE
GLUCOSE UA: 250
NITRITE UA: POSITIVE
Protein, UA: 100
Spec Grav, UA: 1.015
UROBILINOGEN UA: 2
pH, UA: 5

## 2016-03-30 LAB — POCT GLYCOSYLATED HEMOGLOBIN (HGB A1C): HEMOGLOBIN A1C: 5.2

## 2016-03-30 MED ORDER — CIPROFLOXACIN HCL 500 MG PO TABS: 500.0000 mg | ORAL_TABLET | Freq: Two times a day (BID) | ORAL | 0 refills | Status: AC

## 2016-03-30 NOTE — Progress Notes (Signed)
   Subjective:    Patient ID: Stephanie Stone, female    DOB: [birth year], 65 y.o.   MRN: [id number]  HPI  Patient comes in today with urinary symptoms. She said she started noticing some urinary frequency yesterday and by last night she was actually starting to get some dysuria. She also now complains of some low back and leg pain which often accompanies her urinary tract type symptoms. No other worsening or alleviating factors. She has been taking some Azo over-the-counter. No fevers or chills.  Review of Systems     Objective:   Physical Exam  Constitutional: She is oriented to person, place, and time. She appears well-developed and well-nourished.  HENT:  Head: Normocephalic and atraumatic.  Eyes: Conjunctivae and EOM are normal.  Cardiovascular: Normal rate.   Pulmonary/Chest: Effort normal.  Musculoskeletal:  No CVA tenderness  Neurological: She is alert and oriented to person, place, and time.  Skin: Skin is dry. No pallor.  Psychiatric: She has a normal mood and affect. Her behavior is normal.  Vitals reviewed.         Assessment & Plan:  UTI -Dipstick shows positive nitrites and leukocytes as well as a large amounts of blood and glucose. Will treat with Cipro. She did well on this last year.  Glucosuria-performed Hemoccult A1c today. A1c 5.2 today which is in the normal range.

## 2016-03-30 NOTE — Patient Instructions (Addendum)

## 2016-03-30 NOTE — Addendum Note (Signed)
Addended by: Teddy Spike on: 03/30/2016 10:19 AM   Modules accepted: Orders

## 2016-04-04 ENCOUNTER — Other Ambulatory Visit: Payer: Self-pay | Admitting: Family Medicine

## 2016-04-05 ENCOUNTER — Other Ambulatory Visit: Payer: Self-pay | Admitting: Family Medicine

## 2016-04-20 ENCOUNTER — Telehealth: Payer: Self-pay | Admitting: Family Medicine

## 2016-04-20 NOTE — Telephone Encounter (Signed)
I called pt and left a message stating she needs to call the office for a f/u on meds appt with Dr. Madilyn Fireman

## 2016-05-20 ENCOUNTER — Telehealth: Payer: Self-pay

## 2016-05-20 NOTE — Telephone Encounter (Signed)
Pt reports that when picking her grandchild she pulled a muscle in her lower back . She has a appointment with you on 05/27/16. She is asking could you send her in a muscle relaxer. Please advise.

## 2016-05-24 MED ORDER — CYCLOBENZAPRINE HCL 10 MG PO TABS: 5.0000 mg | ORAL_TABLET | Freq: Every evening | ORAL | 0 refills | Status: DC | PRN

## 2016-05-24 NOTE — Telephone Encounter (Signed)
Pt.notified

## 2016-05-24 NOTE — Telephone Encounter (Signed)
Prescription  Sent. Sorry Let her know I was out of the office on Friday.  Marland Kitchen

## 2016-05-27 ENCOUNTER — Ambulatory Visit (INDEPENDENT_AMBULATORY_CARE_PROVIDER_SITE_OTHER): Payer: Medicare Other | Admitting: Family Medicine

## 2016-05-27 ENCOUNTER — Encounter: Payer: Self-pay | Admitting: Family Medicine

## 2016-05-27 VITALS — BP 117/74 | HR 77 | Ht 62.0 in | Wt 146.0 lb

## 2016-05-27 DIAGNOSIS — Z0000 Encounter for general adult medical examination without abnormal findings: Secondary | ICD-10-CM

## 2016-05-27 DIAGNOSIS — R030 Elevated blood-pressure reading, without diagnosis of hypertension: Secondary | ICD-10-CM | POA: Diagnosis not present

## 2016-05-27 DIAGNOSIS — E7800 Pure hypercholesterolemia, unspecified: Secondary | ICD-10-CM | POA: Diagnosis not present

## 2016-05-27 DIAGNOSIS — Z1211 Encounter for screening for malignant neoplasm of colon: Secondary | ICD-10-CM

## 2016-05-27 DIAGNOSIS — Z23 Encounter for immunization: Secondary | ICD-10-CM | POA: Diagnosis not present

## 2016-05-27 NOTE — Progress Notes (Signed)
Subjective:   Stephanie Stone is a 65 y.o. female who presents for an Initial Medicare Annual Wellness Visit.  Review of Systems    Comprehensive ROS is negative.         Objective:    Today's Vitals   05/27/16 1020  BP: 117/74  Pulse: 77  SpO2: 97%  Weight: 146 lb (66.2 kg)  Height: 5\' 2"  (1.575 m)   Body mass index is 26.7 kg/m.   Current Medications (verified) Outpatient Encounter Prescriptions as of 05/27/2016  Medication Sig  . aspirin 81 MG EC tablet Take 81 mg by mouth daily.    . cyclobenzaprine (FLEXERIL) 10 MG tablet Take 0.5-1 tablets (5-10 mg total) by mouth at bedtime as needed for muscle spasms.  Marland Kitchen eletriptan (RELPAX) 20 MG tablet One tablet by mouth at onset of headache. May repeat in 2 hours if headache persists or recurs. may repeat in 2 hours if necessary  . estradiol (ESTRACE) 0.1 MG/GM vaginal cream Place 1 Applicatorful vaginally at bedtime. After 2 weeks can decrease to 3 times per week.  Marland Kitchen FLUoxetine (PROZAC) 20 MG tablet Take 1 tablet (20 mg total) by mouth daily.  . fluticasone (FLONASE) 50 MCG/ACT nasal spray Place 2 sprays into both nostrils daily.  . Linaclotide (LINZESS) 145 MCG CAPS capsule Take 1 capsule (145 mcg total) by mouth daily.   No facility-administered encounter medications on file as of 05/27/2016.     Allergies (verified) Azithromycin; Codeine; Lovastatin; Miconazole nitrate; and Sulfur   History: Past Medical History:  Diagnosis Date  . Alopecia    areata  . Depression   . Hyperlipidemia   . Hypertension   . Lung infiltrate 2010   Resolved on its own without biopsy  . Menopause, premature   . Migraines   . MVP (mitral valve prolapse)    Past Surgical History:  Procedure Laterality Date  . ABDOMINAL HYSTERECTOMY  1979   endometriosis  . APPENDECTOMY     Family History  Problem Relation Age of Onset  . Heart attack Father   . Heart attack Mother   . Hypertension Mother   . Coronary artery disease Mother 29  .  Liver cancer Mother   . Heart attack Brother 15   Social History   Occupational History  . Not on file.   Social History Main Topics  . Smoking status: Never Smoker  . Smokeless tobacco: Never Used  . Alcohol use No  . Drug use: No  . Sexual activity: Not on file    Tobacco Counseling Counseling given: Not Answered   Activities of Daily Living In your present state of health, do you have any difficulty performing the following activities: 05/27/2016  Hearing? Y  Vision? N  Difficulty concentrating or making decisions? N  Walking or climbing stairs? N  Dressing or bathing? N  Doing errands, shopping? N  Some recent data might be hidden    Immunizations and Health Maintenance Immunization History  Administered Date(s) Administered  . Influenza,inj,Quad PF,36+ Mos 04/20/2013  . Influenza-Unspecified 04/03/2014  . Td 07/13/2003  . Tdap 12/12/2015   Health Maintenance Due  Topic Date Due  . ZOSTAVAX  04/28/2011  . COLONOSCOPY  02/08/2016  . DEXA SCAN  04/27/2016  . PNA vac Low Risk Adult (1 of 2 - PCV13) 04/27/2016    Patient Care Team: [name], MD as PCP - General  Indicate any recent Medical Services you may have received from other than [facility] providers in  the past year (date may be approximate).     Assessment:   This is a routine wellness examination for Stephanie Stone.   Hearing/Vision screen No exam data present  Dietary issues and exercise activities discussed: Current Exercise Habits: The patient does not participate in regular exercise at present  Goals    None     Depression Screen PHQ 2/9 Scores 05/27/2016  PHQ - 2 Score 0    Fall Risk Fall Risk  05/27/2016  Falls in the past year? No    Cognitive Function:     6CIT Screen 05/27/2016  What Year? 0 points  What month? 0 points  What time? 0 points  Count back from 20 0 points  Months in reverse 0 points  Repeat phrase 4 points  Total Score 4    Screening Tests Health  Maintenance  Topic Date Due  . ZOSTAVAX  04/28/2011  . COLONOSCOPY  02/08/2016  . DEXA SCAN  04/27/2016  . PNA vac Low Risk Adult (1 of 2 - PCV13) 04/27/2016  . INFLUENZA VACCINE  07/11/2016 (Originally 02/10/2016)  . MAMMOGRAM  01/14/2018  . TETANUS/TDAP  12/11/2025  . Hepatitis C Screening  Completed  . HIV Screening  Completed      Plan:     During the course of the visit, Stephanie Stone was educated and counseled about the following appropriate screening and preventive services:   Vaccines to include Pneumoccal, Influenza, Hepatitis B, Td, Zostavax, HCV  Electrocardiogram - Rate of 66 bpm, normal sinus rhythm with no acute ST-T wave changes.  Cardiovascular disease screening  Colorectal cancer screening - reminded she is due.    Bone density screening  Diabetes screening  Glaucoma screening  Mammography/PAP - UTD   Smoking cessation counseling  Patient Instructions (the written plan) were given to the patient.    Telford Archambeau, MD   05/27/2016

## 2016-05-27 NOTE — Patient Instructions (Signed)
Keep up a regular exercise program and make sure you are eating a healthy diet Try to eat 4 servings of dairy a day, or if you are lactose intolerant take a calcium with vitamin D daily.  Your vaccines are up to date.   

## 2016-07-14 ENCOUNTER — Other Ambulatory Visit: Payer: Self-pay | Admitting: Family Medicine

## 2016-08-31 ENCOUNTER — Ambulatory Visit (INDEPENDENT_AMBULATORY_CARE_PROVIDER_SITE_OTHER): Payer: Medicare Other | Admitting: Family Medicine

## 2016-08-31 ENCOUNTER — Encounter: Payer: Self-pay | Admitting: Family Medicine

## 2016-08-31 DIAGNOSIS — R42 Dizziness and giddiness: Secondary | ICD-10-CM

## 2016-08-31 NOTE — Patient Instructions (Signed)
Call if not better in 2-3 weeks.

## 2016-08-31 NOTE — Progress Notes (Signed)
Subjective:    Patient ID: Stephanie Stone, female    DOB: 1950/08/19, 66 y.o.   MRN: [id number]  HPI C/O of dizziness intermittant since Jan 5th. Has been getting progressively worse.  Notices it more when she turns her head quickly or lays back quickly. No nausea.  Has had vision changes for months and has an eye appt tomorrow.  Says even stopped drivnig for a few weeks in Jan bc she was afraid to drive. No ear pain or pressure recently.  She has not had any sinus symptoms. No head trauma or injury. Her brother has a history of stroke. He notices her symptoms are worse when she turns over in bed or if she looks up very quickly.   Review of Systems  Ht 5\' 2"  (1.575 m)   SpO2 95%     Allergies  Allergen Reactions  . Azithromycin Nausea Only  . Codeine     REACTION: NAUSEA  . Lovastatin   . Miconazole Nitrate     REACTION: Nausea  . Sulfur     Past Medical History:  Diagnosis Date  . Alopecia    areata  . Depression   . Hyperlipidemia   . Hypertension   . Lung infiltrate 2010   Resolved on its own without biopsy  . Menopause, premature   . Migraines   . MVP (mitral valve prolapse)     Past Surgical History:  Procedure Laterality Date  . ABDOMINAL HYSTERECTOMY  1979   endometriosis  . APPENDECTOMY      Social History   Social History  . Marital status: Married    Spouse name: N/A  . Number of children: N/A  . Years of education: N/A   Occupational History  . Not on file.   Social History Main Topics  . Smoking status: Never Smoker  . Smokeless tobacco: Never Used  . Alcohol use No  . Drug use: No  . Sexual activity: Not on file   Other Topics Concern  . Not on file   Social History Narrative   No regular exercise.     Family History  Problem Relation Age of Onset  . Heart attack Father   . Heart attack Mother   . Hypertension Mother   . Coronary artery disease Mother 45  . Liver cancer Mother   . Heart attack Brother 43  . Stroke Brother      Outpatient Encounter Prescriptions as of 08/31/2016  Medication Sig  . aspirin 81 MG EC tablet Take 81 mg by mouth daily.    Marland Kitchen eletriptan (RELPAX) 20 MG tablet One tablet by mouth at onset of headache. May repeat in 2 hours if headache persists or recurs. may repeat in 2 hours if necessary  . FLUoxetine (PROZAC) 20 MG tablet Take 1 tablet (20 mg total) by mouth daily.  . fluticasone (FLONASE) 50 MCG/ACT nasal spray SHAKE LIQUID AND USE 2 SPRAYS IN EACH NOSTRIL DAILY  . Linaclotide (LINZESS) 145 MCG CAPS capsule Take 1 capsule (145 mcg total) by mouth daily.  . [DISCONTINUED] cyclobenzaprine (FLEXERIL) 10 MG tablet Take 0.5-1 tablets (5-10 mg total) by mouth at bedtime as needed for muscle spasms.  . [DISCONTINUED] estradiol (ESTRACE) 0.1 MG/GM vaginal cream Place 1 Applicatorful vaginally at bedtime. After 2 weeks can decrease to 3 times per week.   No facility-administered encounter medications on file as of 08/31/2016.          Objective:   Physical Exam  Constitutional: She is  oriented to person, place, and time. She appears well-developed and well-nourished.  HENT:  Head: Normocephalic and atraumatic.  Right Ear: External ear normal.  Left Ear: External ear normal.  Nose: Nose normal.  Mouth/Throat: Oropharynx is clear and moist.  TMs and canals are clear.   Eyes: Conjunctivae and EOM are normal. Pupils are equal, round, and reactive to light.  Neck: Neck supple. No thyromegaly present.  Cardiovascular: Normal rate, regular rhythm and normal heart sounds.   Pulmonary/Chest: Effort normal and breath sounds normal. She has no wheezes.  Lymphadenopathy:    She has no cervical adenopathy.  Neurological: She is alert and oriented to person, place, and time. No cranial nerve deficit. She exhibits normal muscle tone.  Positive Dix-Hallpike maneuver to the left. Nystagmus with horizontal.  Skin: Skin is warm and dry.  Psychiatric: She has a normal mood and affect.         Assessment & Plan:  Benign positional vertigo-gave reassurance and discussed diagnosis. Given handout on exercises to do on her own at home. She could declined a prescription for meclizine. She says it actually is been a little better in the last week or so. If she's not improving with the exercises after 2-3 weeks gives a call back and we'll place her in vestibular rehabilitation. Were normal today. No sign of sinus symptoms and ear exam is normal.  She is due for CMP and lipid panel. Will add a CBC.

## 2016-09-01 DIAGNOSIS — H5203 Hypermetropia, bilateral: Secondary | ICD-10-CM | POA: Diagnosis not present

## 2016-09-01 DIAGNOSIS — H40033 Anatomical narrow angle, bilateral: Secondary | ICD-10-CM | POA: Diagnosis not present

## 2016-09-01 DIAGNOSIS — H52223 Regular astigmatism, bilateral: Secondary | ICD-10-CM | POA: Diagnosis not present

## 2016-09-01 DIAGNOSIS — H524 Presbyopia: Secondary | ICD-10-CM | POA: Diagnosis not present

## 2016-09-23 DIAGNOSIS — R42 Dizziness and giddiness: Secondary | ICD-10-CM | POA: Diagnosis not present

## 2016-09-23 DIAGNOSIS — R030 Elevated blood-pressure reading, without diagnosis of hypertension: Secondary | ICD-10-CM | POA: Diagnosis not present

## 2016-09-23 DIAGNOSIS — E7800 Pure hypercholesterolemia, unspecified: Secondary | ICD-10-CM | POA: Diagnosis not present

## 2016-09-23 LAB — CBC WITH DIFFERENTIAL/PLATELET
Basophils Absolute: 0 cells/uL (ref 0–200)
Basophils Relative: 0 %
EOS ABS: 188 {cells}/uL (ref 15–500)
EOS PCT: 4 %
HCT: 42.6 % (ref 35.0–45.0)
Hemoglobin: 14.1 g/dL (ref 11.7–15.5)
Lymphocytes Relative: 29 %
Lymphs Abs: 1363 cells/uL (ref 850–3900)
MCH: 30.4 pg (ref 27.0–33.0)
MCHC: 33.1 g/dL (ref 32.0–36.0)
MCV: 91.8 fL (ref 80.0–100.0)
MONOS PCT: 12 %
MPV: 11.9 fL (ref 7.5–12.5)
Monocytes Absolute: 564 cells/uL (ref 200–950)
NEUTROS ABS: 2585 {cells}/uL (ref 1500–7800)
Neutrophils Relative %: 55 %
PLATELETS: 234 10*3/uL (ref 140–400)
RBC: 4.64 MIL/uL (ref 3.80–5.10)
RDW: 13.9 % (ref 11.0–15.0)
WBC: 4.7 10*3/uL (ref 3.8–10.8)

## 2016-09-23 LAB — LIPID PANEL
CHOL/HDL RATIO: 3.9 ratio (ref <5.0)
Cholesterol: 216 mg/dL — ABNORMAL HIGH (ref <200)
HDL: 56 mg/dL (ref >50)
LDL CALC: 141 mg/dL — AB (ref <100)
Triglycerides: 94 mg/dL (ref <150)
VLDL: 19 mg/dL (ref <30)

## 2016-09-23 LAB — COMPLETE METABOLIC PANEL WITH GFR
ALT: 15 U/L (ref 6–29)
AST: 18 U/L (ref 10–35)
Albumin: 4.1 g/dL (ref 3.6–5.1)
Alkaline Phosphatase: 86 U/L (ref 33–130)
BUN: 20 mg/dL (ref 7–25)
CHLORIDE: 107 mmol/L (ref 98–110)
CO2: 24 mmol/L (ref 20–31)
CREATININE: 0.94 mg/dL (ref 0.50–0.99)
Calcium: 9.8 mg/dL (ref 8.6–10.4)
GFR, Est African American: 74 mL/min (ref >=60)
GFR, Est Non African American: 64 mL/min (ref >=60)
GLUCOSE: 80 mg/dL (ref 65–99)
Potassium: 5.1 mmol/L (ref 3.5–5.3)
SODIUM: 141 mmol/L (ref 135–146)
Total Bilirubin: 0.5 mg/dL (ref 0.2–1.2)
Total Protein: 6.2 g/dL (ref 6.1–8.1)

## 2016-09-24 NOTE — Progress Notes (Signed)
All labs are normal. 

## 2016-12-02 ENCOUNTER — Other Ambulatory Visit: Payer: Self-pay | Admitting: Family Medicine

## 2017-03-01 ENCOUNTER — Other Ambulatory Visit: Payer: Self-pay | Admitting: Family Medicine

## 2017-05-02 ENCOUNTER — Ambulatory Visit (INDEPENDENT_AMBULATORY_CARE_PROVIDER_SITE_OTHER): Payer: Medicare Other | Admitting: Family Medicine

## 2017-05-02 ENCOUNTER — Encounter: Payer: Self-pay | Admitting: Family Medicine

## 2017-05-02 VITALS — BP 144/74 | HR 71 | Ht 62.0 in | Wt 156.0 lb

## 2017-05-02 DIAGNOSIS — Z5181 Encounter for therapeutic drug level monitoring: Secondary | ICD-10-CM

## 2017-05-02 DIAGNOSIS — Z6828 Body mass index (BMI) 28.0-28.9, adult: Secondary | ICD-10-CM | POA: Diagnosis not present

## 2017-05-02 DIAGNOSIS — Z23 Encounter for immunization: Secondary | ICD-10-CM | POA: Diagnosis not present

## 2017-05-02 DIAGNOSIS — Z0000 Encounter for general adult medical examination without abnormal findings: Secondary | ICD-10-CM | POA: Diagnosis not present

## 2017-05-02 LAB — BASIC METABOLIC PANEL WITH GFR
BUN: 14 mg/dL (ref 7–25)
CO2: 26 mmol/L (ref 20–32)
Calcium: 9.8 mg/dL (ref 8.6–10.4)
Chloride: 106 mmol/L (ref 98–110)
Creat: 0.94 mg/dL (ref 0.50–0.99)
GFR, EST NON AFRICAN AMERICAN: 63 mL/min/{1.73_m2} (ref >=60)
GFR, Est African American: 73 mL/min/{1.73_m2} (ref >=60)
GLUCOSE: 89 mg/dL (ref 65–139)
Potassium: 4.5 mmol/L (ref 3.5–5.3)
SODIUM: 141 mmol/L (ref 135–146)

## 2017-05-02 NOTE — Patient Instructions (Addendum)

## 2017-05-02 NOTE — Progress Notes (Signed)
Subjective:   Stephanie Stone is a 66 y.o. female who presents for Medicare Annual (Subsequent) preventive examination.  Having some pain in her hip girdle. Says it is an aching.  It is bilateral and radiates to her buttock and low back area. No vaginal pain or pressure. Has been sitting  In the car for about 4 hours a day driving to pick up her grandson who lives in [place].   Worse in the evening when she is tire and wanting to rest.  No current exercise regimen.   Review of Systems:  Comprehensive ROS is negative.     BP (!) 144/74   Pulse 71   Ht 5\' 2"  (1.575 m)   Wt 156 lb (70.8 kg)   SpO2 99%   BMI 28.53 kg/m     Allergies  Allergen Reactions  . Azithromycin Nausea Only  . Codeine     REACTION: NAUSEA  . Lovastatin   . Miconazole Nitrate     REACTION: Nausea  . Sulfur         Objective:     Vitals: BP (!) 144/74   Pulse 71   Ht 5\' 2"  (1.575 m)   Wt 156 lb (70.8 kg)   SpO2 99%   BMI 28.53 kg/m   Body mass index is 28.53 kg/m.   Tobacco History  Smoking Status  . Never Smoker  Smokeless Tobacco  . Never Used     Counseling given: Not Answered   Past Medical History:  Diagnosis Date  . Alopecia    areata  . Depression   . Hyperlipidemia   . Hypertension   . Lung infiltrate 2010   Resolved on its own without biopsy  . Menopause, premature   . Migraines   . MVP (mitral valve prolapse)    Past Surgical History:  Procedure Laterality Date  . ABDOMINAL HYSTERECTOMY  1979   endometriosis  . APPENDECTOMY     Family History  Problem Relation Age of Onset  . Heart attack Father   . Heart attack Mother   . Hypertension Mother   . Coronary artery disease Mother 68  . Liver cancer Mother   . Heart attack Brother 68  . Stroke Brother    History  Sexual Activity  . Sexual activity: Not on file    Outpatient Encounter Prescriptions as of 05/02/2017  Medication Sig  . aspirin 81 MG EC tablet Take 81 mg by mouth daily.    Marland Kitchen eletriptan (RELPAX)  20 MG tablet One tablet by mouth at onset of headache. May repeat in 2 hours if headache persists or recurs. may repeat in 2 hours if necessary  . FLUoxetine (PROZAC) 20 MG tablet TAKE 1 TABLET BY MOUTH DAILY  . fluticasone (FLONASE) 50 MCG/ACT nasal spray SHAKE LIQUID AND USE 2 SPRAYS IN EACH NOSTRIL DAILY  . Linaclotide (LINZESS) 145 MCG CAPS capsule Take 1 capsule (145 mcg total) by mouth daily.   No facility-administered encounter medications on file as of 05/02/2017.     Activities of Daily Living In your present state of health, do you have any difficulty performing the following activities: 05/02/2017 05/27/2016  Hearing? Tempie Donning  Vision? N N  Difficulty concentrating or making decisions? N N  Walking or climbing stairs? N N  Dressing or bathing? N N  Doing errands, shopping? N N  Some recent data might be hidden    Patient Care Team: [name], MD as PCP - General  Assessment:    Exercise Activities and Dietary recommendations Current Exercise Habits: The patient does not participate in regular exercise at present, Exercise limited by: None identified  Goals    None     Fall Risk Fall Risk  05/02/2017 08/31/2016 05/27/2016  Falls in the past year? No No No   Depression Screen PHQ 2/9 Scores 05/02/2017 08/31/2016 05/27/2016  PHQ - 2 Score 0 0 0     Cognitive Function     6CIT Screen 05/02/2017 05/27/2016  What Year? 0 points 0 points  What month? 0 points 0 points  What time? 0 points 0 points  Count back from 20 0 points 0 points  Months in reverse 0 points 0 points  Repeat phrase 2 points 4 points  Total Score 2 4    Immunization History  Administered Date(s) Administered  . Influenza, High Dose Seasonal PF 05/02/2017  . Influenza,inj,Quad PF,6+ Mos 04/20/2013, 05/27/2016  . Influenza-Unspecified 04/03/2014  . Pneumococcal Conjugate-13 05/02/2017  . Td 07/13/2003  . Tdap 12/12/2015   Screening Tests Health Maintenance  Topic Date Due   . COLONOSCOPY  02/08/2016  . DEXA SCAN  04/27/2016  . MAMMOGRAM  01/14/2018  . PNA vac Low Risk Adult (2 of 2 - PPSV23) 05/02/2018  . TETANUS/TDAP  12/11/2025  . INFLUENZA VACCINE  Completed  . Hepatitis C Screening  Completed      Plan:   Medicare wellness Exam   I have personally reviewed and noted the following in the patient's chart:   . Medical and social history . Use of alcohol, tobacco or illicit drugs - none . Current medications and supplements - updated . Functional ability and status - overall great functional status . Nutritional status . Physical activity - no regular exercise routine. We discussed some strategies around this. She has a friend that she plans on starting still her sneakers with. Discussed maybe even doing some yoga exercise. She has some that he has at home and working on stretches for the hips and low back and working on course. . Advanced directives - info given and reviewed.   . List of other physicians . Hospitalizations, surgeries, and ER visits in previous 12 months . Vitals . Screenings to include cognitive, depression, and falls - past cognitive screen. Marland Kitchen Referrals and appointments  - no referrals made today. . Bilat hip pain - work on stretches and exercises and if not improving then consider x-rays for further evaluation. . Prefers cologuard over colonoscopy. Colocort paperwork completed and faxed. . Flu vaccine and prevnar 13 given today.   In addition, I have reviewed and discussed with patient certain preventive protocols, quality metrics, and best practice recommendations. A written personalized care plan for preventive services as well as general preventive health recommendations were provided to patient.     Beatrice Lecher, MD  05/02/2017

## 2017-05-03 NOTE — Progress Notes (Signed)
All labs are normal. 

## 2017-06-09 ENCOUNTER — Other Ambulatory Visit: Payer: Self-pay | Admitting: Family Medicine

## 2017-07-14 ENCOUNTER — Other Ambulatory Visit: Payer: Self-pay | Admitting: Family Medicine

## 2017-07-29 ENCOUNTER — Other Ambulatory Visit: Payer: Self-pay | Admitting: *Deleted

## 2017-07-29 ENCOUNTER — Other Ambulatory Visit: Payer: Self-pay | Admitting: Family Medicine

## 2017-07-29 MED ORDER — CYCLOBENZAPRINE HCL 10 MG PO TABS: 5.0000 mg | ORAL_TABLET | Freq: Every evening | ORAL | 0 refills | Status: DC | PRN

## 2017-09-13 DIAGNOSIS — Z1211 Encounter for screening for malignant neoplasm of colon: Secondary | ICD-10-CM | POA: Diagnosis not present

## 2017-09-21 LAB — COLOGUARD: Cologuard: NEGATIVE

## 2017-11-02 ENCOUNTER — Ambulatory Visit: Payer: Medicare Other | Admitting: Family Medicine

## 2017-11-09 ENCOUNTER — Ambulatory Visit (INDEPENDENT_AMBULATORY_CARE_PROVIDER_SITE_OTHER): Payer: Medicare Other | Admitting: Family Medicine

## 2017-11-09 ENCOUNTER — Encounter: Payer: Self-pay | Admitting: Family Medicine

## 2017-11-09 ENCOUNTER — Telehealth: Payer: Self-pay | Admitting: Family Medicine

## 2017-11-09 VITALS — BP 136/78 | HR 65 | Ht 62.0 in | Wt 155.0 lb

## 2017-11-09 DIAGNOSIS — G43109 Migraine with aura, not intractable, without status migrainosus: Secondary | ICD-10-CM

## 2017-11-09 DIAGNOSIS — G8929 Other chronic pain: Secondary | ICD-10-CM

## 2017-11-09 DIAGNOSIS — M25562 Pain in left knee: Secondary | ICD-10-CM

## 2017-11-09 DIAGNOSIS — F4321 Adjustment disorder with depressed mood: Secondary | ICD-10-CM | POA: Diagnosis not present

## 2017-11-09 MED ORDER — ELETRIPTAN HYDROBROMIDE 20 MG PO TABS: 20.0000 mg | ORAL_TABLET | ORAL | 3 refills | Status: DC | PRN

## 2017-11-09 MED ORDER — LINACLOTIDE 145 MCG PO CAPS: 145.0000 ug | ORAL_CAPSULE | Freq: Every day | ORAL | 11 refills | Status: DC

## 2017-11-09 NOTE — Telephone Encounter (Signed)
Received fax from [employer] that [employer] requires a PA. Information has been sent to the insurance company. Awaiting determination.   

## 2017-11-09 NOTE — Telephone Encounter (Signed)
Received fax from [employer] that Stephanie Stone was approved from 10/10/2017 through 11/09/2018. Pharmacy notified and forms sent to scan.   Reference ID: [id number].

## 2017-11-09 NOTE — Progress Notes (Signed)
Subjective:    CC: 75-month follow-up for mood.  HPI:  Follow-up depression-she is currently on fluoxetine but is wanting to wean off.  She is Artie started decreasing her dose and is down to every third day on the medication and is doing really well.  Follow-up migraine headaches-not currently on any prophylaxis but uses Relpax as needed.  She can even remember the last time she had a migraine and did not even realize her Relpax expired.  She does get auras with her migraines but usually they are stress-induced.  She also complains of left knee pain that is been going on for years but more recently her pain is been bothering her a little bit more medially.  She is been taking lots of ibuprofen which does seem to help.  She notices if she crosses her feet or her legs it is particularly painful.  Denies any known injury or trauma or old surgeries on that knee.  Past medical history, Surgical history, Family history not pertinant except as noted below, Social history, Allergies, and medications have been entered into the medical record, reviewed, and corrections made.   Review of Systems: No fevers, chills, night sweats, weight loss, chest pain, or shortness of breath.   Objective:    General: Well Developed, well nourished, and in no acute distress.  Neuro: Alert and oriented x3, extra-ocular muscles intact, sensation grossly intact.  HEENT: Normocephalic, atraumatic  Skin: Warm and dry, no rashes. Cardiac: Regular rate and rhythm, no murmurs rubs or gallops, no lower extremity edema.  Respiratory: Clear to auscultation bilaterally. Not using accessory muscles, speaking in full sentences. MSK: Left knee with normal flexion extension.  Tender just along the medial joint line joint line towards the patella and then tender just distal to the medial joint line.  Nontender over the patella over the patellar tendon.  Significant crepitus over the knee.  No increased laxity with anterior drawer test.   Strength at the hip knee and ankle is 5 out of 5.   Impression and Recommendations:    Depression-negative PHQ 2.  Gad 7 score of 0.  Need to taper off medication.  She is actually doing really fantastic.  Left knee pain-she is tender along the medial joint line but just distal to that as well.  Most consistent with possible peds anserine bursitis and possible medial collateral ligament inflammation.  I suspect she has a couple issues going on.  She could also have some medial compartment arthritis as well.  We will start with exercises for pens anserine bursitis as well as the medial collateral ligament and work on some quad strengthening.  If not improving over the next 3 weeks and consider x-ray for further evaluation for possible osteoarthritis.   Migraine headaches-overall doing well.  Refilled her triptan.

## 2017-11-28 ENCOUNTER — Encounter: Payer: Self-pay | Admitting: Family Medicine

## 2018-04-13 ENCOUNTER — Other Ambulatory Visit: Payer: Self-pay | Admitting: Family Medicine

## 2018-04-28 NOTE — Progress Notes (Deleted)
Subjective:   Stephanie Stone is a 67 y.o. female who presents for Medicare Annual (Subsequent) preventive examination.  Review of Systems:  No ROS.  Medicare Wellness Visit. Additional risk factors are reflected in the social history.    Sleep patterns:    Home Safety/Smoke Alarms: Feels safe in home. Smoke alarms in place.  Living environment;     Female:   Pap- aged out     [place] scan-        CCS- utd     Objective:     Vitals: There were no vitals taken for this visit.  There is no height or weight on file to calculate BMI.  Advanced Directives 05/02/2017  Does Patient Have a Medical Advance Directive? No  Would patient like information on creating a medical advance directive? Yes (MAU/Ambulatory/Procedural Areas - Information given)    Tobacco Social History   Tobacco Use  Smoking Status Never Smoker  Smokeless Tobacco Never Used     Counseling given: Not Answered   Clinical Intake:                       Past Medical History:  Diagnosis Date  . Alopecia    areata  . Depression   . Hyperlipidemia   . Hypertension   . Lung infiltrate 2010   Resolved on its own without biopsy  . Menopause, premature   . Migraines   . MVP (mitral valve prolapse)    Past Surgical History:  Procedure Laterality Date  . ABDOMINAL HYSTERECTOMY  1979   endometriosis  . APPENDECTOMY     Family History  Problem Relation Age of Onset  . Heart attack Father   . Heart attack Mother   . Hypertension Mother   . Coronary artery disease Mother 38  . Liver cancer Mother   . Heart attack Brother 61  . Stroke Brother    Social History   Socioeconomic History  . Marital status: Married    Spouse name: Not on file  . Number of children: Not on file  . Years of education: Not on file  . Highest education level: Not on file  Occupational History  . Not on file  Social Needs  . Financial resource strain: Not on file  . Food insecurity:    Worry:  Not on file    Inability: Not on file  . Transportation needs:    Medical: Not on file    Non-medical: Not on file  Tobacco Use  . Smoking status: Never Smoker  . Smokeless tobacco: Never Used  Substance and Sexual Activity  . Alcohol use: No  . Drug use: No  . Sexual activity: Not on file  Lifestyle  . Physical activity:    Days per week: Not on file    Minutes per session: Not on file  . Stress: Not on file  Relationships  . Social connections:    Talks on phone: Not on file    Gets together: Not on file    Attends religious service: Not on file    Active member of club or organization: Not on file    Attends meetings of clubs or organizations: Not on file    Relationship status: Not on file  Other Topics Concern  . Not on file  Social History Narrative   No regular exercise.     Outpatient Encounter Medications as of 05/08/2018  Medication Sig  .  aspirin 81 MG EC tablet Take 81 mg by mouth daily.    Marland Kitchen eletriptan (RELPAX) 20 MG tablet Take 1 tablet (20 mg total) by mouth as needed. may repeat in 2 hours if necessary  . FLUoxetine (PROZAC) 20 MG tablet TAKE 1 TABLET BY MOUTH DAILY  . fluticasone (FLONASE) 50 MCG/ACT nasal spray SHAKE LIQUID AND USE 2 SPRAYS IN EACH NOSTRIL DAILY  . linaclotide (LINZESS) 145 MCG CAPS capsule Take 1 capsule (145 mcg total) by mouth daily.   No facility-administered encounter medications on file as of 05/08/2018.     Activities of Daily Living In your present state of health, do you have any difficulty performing the following activities: 05/02/2017  Hearing? Y  Vision? N  Difficulty concentrating or making decisions? N  Walking or climbing stairs? N  Dressing or bathing? N  Doing errands, shopping? N  Some recent data might be hidden    Patient Care Team: [name], MD as PCP - General    Assessment:   This is a routine wellness examination for Melodye.Physical assessment deferred to PCP.   Exercise Activities and  Dietary recommendations   Diet  Breakfast: Lunch:  Dinner:       Goals   None     Fall Risk Fall Risk  11/09/2017 05/02/2017 08/31/2016 05/27/2016  Falls in the past year? No No No No   Is the patient's home free of loose throw rugs in walkways, pet beds, electrical cords, etc?   {Blank single:19197::"yes","no"}      Grab bars in the bathroom? {Blank single:19197::"yes","no"}      Handrails on the stairs?   {Blank single:19197::"yes","no"}      Adequate lighting?   {Blank single:19197::"yes","no"}   Depression Screen PHQ 2/9 Scores 11/09/2017 05/02/2017 08/31/2016 05/27/2016  PHQ - 2 Score 0 0 0 0  PHQ- 9 Score 1 - - -     Cognitive Function     6CIT Screen 05/02/2017 05/27/2016  What Year? 0 points 0 points  What month? 0 points 0 points  What time? 0 points 0 points  Count back from 20 0 points 0 points  Months in reverse 0 points 0 points  Repeat phrase 2 points 4 points  Total Score 2 4    Immunization History  Administered Date(s) Administered  . Influenza, High Dose Seasonal PF 05/02/2017  . Influenza,inj,Quad PF,6+ Mos 04/20/2013, 05/27/2016  . Influenza-Unspecified 04/03/2014  . Pneumococcal Conjugate-13 05/02/2017  . Td 07/13/2003  . Tdap 12/12/2015    Screening Tests Health Maintenance  Topic Date Due  . DEXA SCAN  04/27/2016  . MAMMOGRAM  01/14/2018  . INFLUENZA VACCINE  02/09/2018  . PNA vac Low Risk Adult (2 of 2 - PPSV23) 05/02/2018  . Fecal DNA (Cologuard)  09/21/2020  . TETANUS/TDAP  12/11/2025  . Hepatitis C Screening  Completed        Plan:   ***   I have personally reviewed and noted the following in the patient's chart:   . Medical and social history . Use of alcohol, tobacco or illicit drugs  . Current medications and supplements . Functional ability and status . Nutritional status . Physical activity . Advanced directives . List of other physicians . Hospitalizations, surgeries, and ER visits in previous 12  months . Vitals . Screenings to include cognitive, depression, and falls . Referrals and appointments  In addition, I have reviewed and discussed with patient certain preventive protocols, quality metrics, and best practice recommendations. A written personalized  care plan for preventive services as well as general preventive health recommendations were provided to patient.     Joanne Chars, LPN  09/81/1914

## 2018-05-08 ENCOUNTER — Ambulatory Visit: Payer: Medicare Other

## 2018-05-11 NOTE — Progress Notes (Signed)
Subjective:   Stephanie Stone is a 67 y.o. female who presents for Medicare Annual (Subsequent) preventive examination.  Review of Systems:  No ROS.  Medicare Wellness Visit. Additional risk factors are reflected in the social history.  Cardiac Risk Factors include: none Sleep patterns:  Getting 5 hours of sleep at night. Wakes up 2 times a night to go to the bathroom. Reports wakes up a lot during the night and has trouble getting back to sleep. Home Safety/Smoke Alarms: Feels safe in home. Smoke alarms in place.  Living environment; Lives with husband in a 2 sory home. Steps have handrails on them. Shower is a walk in shower no grab bars in place. Seat Belt Safety/Bike Helmet: Wears seat belt.   Female:   Pap- aged out     [place]- pt will schedule      Dexa scan- scheduled       CCS- cologuard done last year- normal     Objective:     Vitals: There were no vitals taken for this visit.  There is no height or weight on file to calculate BMI.  Advanced Directives 05/17/2018 05/02/2017  Does Patient Have a Medical Advance Directive? No No  Would patient like information on creating a medical advance directive? Yes (MAU/Ambulatory/Procedural Areas - Information given) Yes (MAU/Ambulatory/Procedural Areas - Information given)    Tobacco Social History   Tobacco Use  Smoking Status Never Smoker  Smokeless Tobacco Never Used     Counseling given: Not Answered   Clinical Intake:  Pre-visit preparation completed: Yes  Pain : No/denies pain     Nutritional Risks: None Diabetes: No  How often do you need to have someone help you when you read instructions, pamphlets, or other written materials from your doctor or pharmacy?: 1 - Never What is the last grade level you completed in school?: 12  Interpreter Needed?: No  Information entered by :: Orlie Dakin, LPN  Past Medical History:  Diagnosis Date  . Alopecia    areata  . Depression   . Hyperlipidemia   . Hypertension    . Lung infiltrate 2010   Resolved on its own without biopsy  . Menopause, premature   . Migraines   . MVP (mitral valve prolapse)    Past Surgical History:  Procedure Laterality Date  . ABDOMINAL HYSTERECTOMY  1979   endometriosis  . APPENDECTOMY     Family History  Problem Relation Age of Onset  . Heart attack Father   . Heart attack Mother   . Hypertension Mother   . Coronary artery disease Mother 55  . Liver cancer Mother   . Heart attack Brother 50  . Stroke Brother    Social History   Socioeconomic History  . Marital status: Married    Spouse name: Sam  . Number of children: 2  . Years of education: 36  . Highest education level: 12th grade  Occupational History  . Occupation: retired    Comment: Psychologist, counselling  Social Needs  . Financial resource strain: Not hard at all  . Food insecurity:    Worry: Never true    Inability: Never true  . Transportation needs:    Medical: No    Non-medical: No  Tobacco Use  . Smoking status: Never Smoker  . Smokeless tobacco: Never Used  Substance and Sexual Activity  . Alcohol use: Yes    Alcohol/week: 1.0 standard drinks    Types: 1 Glasses of wine per week  Comment: once a month  . Drug use: No  . Sexual activity: Not Currently  Lifestyle  . Physical activity:    Days per week: 0 days    Minutes per session: 0 min  . Stress: Not at all  Relationships  . Social connections:    Talks on phone: More than three times a week    Gets together: Twice a week    Attends religious service: Never    Active member of club or organization: No    Attends meetings of clubs or organizations: Never    Relationship status: Married  Other Topics Concern  . Not on file  Social History Narrative   No regular exercise patient states having some stress with her son. Takes Prozac and this helps. Keeps grandchildren at home during the day    Outpatient Encounter Medications as of 05/17/2018  Medication Sig  . aspirin 81 MG EC  tablet Take 81 mg by mouth daily.    Marland Kitchen eletriptan (RELPAX) 20 MG tablet Take 1 tablet (20 mg total) by mouth as needed. may repeat in 2 hours if necessary  . FLUoxetine (PROZAC) 20 MG tablet TAKE 1 TABLET BY MOUTH DAILY  . fluticasone (FLONASE) 50 MCG/ACT nasal spray SHAKE LIQUID AND USE 2 SPRAYS IN EACH NOSTRIL DAILY  . linaclotide (LINZESS) 145 MCG CAPS capsule Take 1 capsule (145 mcg total) by mouth daily.   No facility-administered encounter medications on file as of 05/17/2018.     Activities of Daily Living In your present state of health, do you have any difficulty performing the following activities: 05/17/2018  Hearing? N  Vision? N  Difficulty concentrating or making decisions? N  Walking or climbing stairs? N  Dressing or bathing? N  Doing errands, shopping? N  Preparing Food and eating ? N  Using the Toilet? N  In the past six months, have you accidently leaked urine? Y  Comment wears panti liner when coughs may leak  Do you have problems with loss of bowel control? N  Managing your Medications? N  Managing your Finances? N  Housekeeping or managing your Housekeeping? N  Some recent data might be hidden    Patient Care Team: [name], MD as PCP - General    Assessment:   This is a routine wellness examination for Stephanie Stone.Physical assessment deferred to PCP.   Exercise Activities and Dietary recommendations Current Exercise Habits: The patient does not participate in regular exercise at present, Exercise limited by: None identified Diet Eats healthy with fruits and veggies Breakfast: yogurt or bagel with cream cheese Lunch: sometimes skips Dinner: meat, potatoes or spaghetti      Goals    . Exercise 3x per week (30 min per time)     Increase exercise to at least 30 minutes a day 3 times a week       Fall Risk Fall Risk  05/17/2018 11/09/2017 05/02/2017 08/31/2016 05/27/2016  Falls in the past year? 0 No No No No  Follow up Falls prevention discussed -  - - -   Is the patient's home free of loose throw rugs in walkways, pet beds, electrical cords, etc?   yes      Grab bars in the bathroom? no      Handrails on the stairs?   yes      Adequate lighting?   yes  Depression Screen PHQ 2/9 Scores 05/17/2018 11/09/2017 05/02/2017 08/31/2016  PHQ - 2 Score 0 0 0 0  PHQ- 9  Score - 1 - -     Cognitive Function     6CIT Screen 05/17/2018 05/02/2017 05/27/2016  What Year? 0 points 0 points 0 points  What month? 0 points 0 points 0 points  What time? 0 points 0 points 0 points  Count back from 20 0 points 0 points 0 points  Months in reverse 0 points 0 points 0 points  Repeat phrase 2 points 2 points 4 points  Total Score 2 2 4     Immunization History  Administered Date(s) Administered  . Influenza, High Dose Seasonal PF 05/02/2017  . Influenza,inj,Quad PF,6+ Mos 04/20/2013, 05/27/2016  . Influenza-Unspecified 04/03/2014  . Pneumococcal Conjugate-13 05/02/2017  . Td 07/13/2003  . Tdap 12/12/2015    Screening Tests Health Maintenance  Topic Date Due  . DEXA SCAN  04/27/2016  . MAMMOGRAM  01/14/2018  . INFLUENZA VACCINE  02/09/2018  . PNA vac Low Risk Adult (2 of 2 - PPSV23) 05/02/2018  . Fecal DNA (Cologuard)  09/21/2020  . TETANUS/TDAP  12/11/2025  . Hepatitis C Screening  Completed       Plan:    Please schedule your next medicare wellness visit with me in 1 yr.  Ms. Hassell Done , Thank you for taking time to come for your Medicare Wellness Visit. I appreciate your ongoing commitment to your health goals. Please review the following plan we discussed and let me know if I can assist you in the future.  Continue doing brain stimulating activities (puzzles, reading, adult coloring books, staying active) to keep memory sharp.  Bring a copy of your living will and/or healthcare power of attorney to your next office visit.   These are the goals we discussed: Goals    . Exercise 3x per week (30 min per time)     Increase exercise to at  least 30 minutes a day 3 times a week       This is a list of the screening recommended for you and due dates:  Health Maintenance  Topic Date Due  . DEXA scan (bone density measurement)  04/27/2016  . Mammogram  01/14/2018  . Flu Shot  02/09/2018  . Pneumonia vaccines (2 of 2 - PPSV23) 05/02/2018  . Cologuard (Stool DNA test)  09/21/2020  . Tetanus Vaccine  12/11/2025  .  Hepatitis C: One time screening is recommended by Center for Disease Control  (CDC) for  adults born from [birth year] through 1965.   Completed      I have personally reviewed and noted the following in the patient's chart:   . Medical and social history . Use of alcohol, tobacco or illicit drugs  . Current medications and supplements . Functional ability and status . Nutritional status . Physical activity . Advanced directives . List of other physicians . Hospitalizations, surgeries, and ER visits in previous 12 months . Vitals . Screenings to include cognitive, depression, and falls . Referrals and appointments  In addition, I have reviewed and discussed with patient certain preventive protocols, quality metrics, and best practice recommendations. A written personalized care plan for preventive services as well as general preventive health recommendations were provided to patient.     Joanne Chars, LPN  73/01/1061

## 2018-05-17 ENCOUNTER — Ambulatory Visit (INDEPENDENT_AMBULATORY_CARE_PROVIDER_SITE_OTHER): Payer: Medicare Other

## 2018-05-17 ENCOUNTER — Other Ambulatory Visit: Payer: Self-pay | Admitting: Family Medicine

## 2018-05-17 ENCOUNTER — Ambulatory Visit (INDEPENDENT_AMBULATORY_CARE_PROVIDER_SITE_OTHER): Payer: Federal, State, Local not specified - PPO | Admitting: *Deleted

## 2018-05-17 VITALS — BP 120/90 | HR 65 | Ht 62.0 in | Wt 161.0 lb

## 2018-05-17 DIAGNOSIS — Z0000 Encounter for general adult medical examination without abnormal findings: Secondary | ICD-10-CM | POA: Diagnosis not present

## 2018-05-17 DIAGNOSIS — Z23 Encounter for immunization: Secondary | ICD-10-CM | POA: Diagnosis not present

## 2018-05-17 DIAGNOSIS — Z780 Asymptomatic menopausal state: Secondary | ICD-10-CM

## 2018-05-17 DIAGNOSIS — Z13820 Encounter for screening for osteoporosis: Secondary | ICD-10-CM

## 2018-05-17 DIAGNOSIS — Z1231 Encounter for screening mammogram for malignant neoplasm of breast: Secondary | ICD-10-CM

## 2018-05-17 DIAGNOSIS — M8589 Other specified disorders of bone density and structure, multiple sites: Secondary | ICD-10-CM

## 2018-05-17 MED ORDER — FLUOXETINE HCL 20 MG PO TABS: 20.0000 mg | ORAL_TABLET | Freq: Every day | ORAL | 0 refills | Status: DC

## 2018-05-17 NOTE — Patient Instructions (Addendum)
Please schedule your next medicare wellness visit with me in 1 yr.  Stephanie Stone , Thank you for taking time to come for your Medicare Wellness Visit. I appreciate your ongoing commitment to your health goals. Please review the following plan we discussed and let me know if I can assist you in the future.  Continue doing brain stimulating activities (puzzles, reading, adult coloring books, staying active) to keep memory sharp.  Bring a copy of your living will and/or healthcare power of attorney to your next office visit.  These are the goals we discussed: Goals    . Exercise 3x per week (30 min per time)     Increase exercise to at least 30 minutes a day 3 times a week     Health Maintenance for Postmenopausal Women Menopause is a normal process in which your reproductive ability comes to an end. This process happens gradually over a span of months to years, usually between the ages of 30 and 3. Menopause is complete when you have missed 12 consecutive menstrual periods. It is important to talk with your health care provider about some of the most common conditions that affect postmenopausal women, such as heart disease, cancer, and bone loss (osteoporosis). Adopting a healthy lifestyle and getting preventive care can help to promote your health and wellness. Those actions can also lower your chances of developing some of these common conditions. What should I know about menopause? During menopause, you may experience a number of symptoms, such as:  Moderate-to-severe hot flashes.  Night sweats.  Decrease in sex drive.  Mood swings.  Headaches.  Tiredness.  Irritability.  Memory problems.  Insomnia.  Choosing to treat or not to treat menopausal changes is an individual decision that you make with your health care provider. What should I know about hormone replacement therapy and supplements? Hormone therapy products are effective for treating symptoms that are associated with  menopause, such as hot flashes and night sweats. Hormone replacement carries certain risks, especially as you become older. If you are thinking about using estrogen or estrogen with progestin treatments, discuss the benefits and risks with your health care provider. What should I know about heart disease and stroke? Heart disease, heart attack, and stroke become more likely as you age. This may be due, in part, to the hormonal changes that your body experiences during menopause. These can affect how your body processes dietary fats, triglycerides, and cholesterol. Heart attack and stroke are both medical emergencies. There are many things that you can do to help prevent heart disease and stroke:  Have your blood pressure checked at least every 1-2 years. High blood pressure causes heart disease and increases the risk of stroke.  If you are 67-67 years old, ask your health care provider if you should take aspirin to prevent a heart attack or a stroke.  Do not use any tobacco products, including cigarettes, chewing tobacco, or electronic cigarettes. If you need help quitting, ask your health care provider.  It is important to eat a healthy diet and maintain a healthy weight. ? Be sure to include plenty of vegetables, fruits, low-fat dairy products, and lean protein. ? Avoid eating foods that are high in solid fats, added sugars, or salt (sodium).  Get regular exercise. This is one of the most important things that you can do for your health. ? Try to exercise for at least 150 minutes each week. The type of exercise that you do should increase your heart rate and  make you sweat. This is known as moderate-intensity exercise. ? Try to do strengthening exercises at least twice each week. Do these in addition to the moderate-intensity exercise.  Know your numbers.Ask your health care provider to check your cholesterol and your blood glucose. Continue to have your blood tested as directed by your health  care provider.  What should I know about cancer screening? There are several types of cancer. Take the following steps to reduce your risk and to catch any cancer development as early as possible. Breast Cancer  Practice breast self-awareness. ? This means understanding how your breasts normally appear and feel. ? It also means doing regular breast self-exams. Let your health care provider know about any changes, no matter how small.  If you are 67 or older, have a clinician do a breast exam (clinical breast exam or CBE) every year. Depending on your age, family history, and medical history, it may be recommended that you also have a yearly breast X-ray (mammogram).  If you have a family history of breast cancer, talk with your health care provider about genetic screening.  If you are at high risk for breast cancer, talk with your health care provider about having an MRI and a mammogram every year.  Breast cancer (BRCA) gene test is recommended for women who have family members with BRCA-related cancers. Results of the assessment will determine the need for genetic counseling and BRCA1 and for BRCA2 testing. BRCA-related cancers include these types: ? Breast. This occurs in males or females. ? Ovarian. ? Tubal. This may also be called fallopian tube cancer. ? Cancer of the abdominal or pelvic lining (peritoneal cancer). ? Prostate. ? Pancreatic.  Cervical, Uterine, and Ovarian Cancer Your health care provider may recommend that you be screened regularly for cancer of the pelvic organs. These include your ovaries, uterus, and vagina. This screening involves a pelvic exam, which includes checking for microscopic changes to the surface of your cervix (Pap test).  For women ages 67-67, health care providers may recommend a pelvic exam and a Pap test every three years. For women ages 67-67, they may recommend the Pap test and pelvic exam, combined with testing for human papilloma virus (HPV),  every five years. Some types of HPV increase your risk of cervical cancer. Testing for HPV may also be Stone on women of any age who have unclear Pap test results.  Other health care providers may not recommend any screening for nonpregnant women who are considered low risk for pelvic cancer and have no symptoms. Ask your health care provider if a screening pelvic exam is right for you.  If you have had past treatment for cervical cancer or a condition that could lead to cancer, you need Pap tests and screening for cancer for at least 20 years after your treatment. If Pap tests have been discontinued for you, your risk factors (such as having a new sexual partner) need to be reassessed to determine if you should start having screenings again. Some women have medical problems that increase the chance of getting cervical cancer. In these cases, your health care provider may recommend that you have screening and Pap tests more often.  If you have a family history of uterine cancer or ovarian cancer, talk with your health care provider about genetic screening.  If you have vaginal bleeding after reaching menopause, tell your health care provider.  There are currently no reliable tests available to screen for ovarian cancer.  Lung Cancer Lung cancer  screening is recommended for adults 26-5 years old who are at high risk for lung cancer because of a history of smoking. A yearly low-dose CT scan of the lungs is recommended if you:  Currently smoke.  Have a history of at least 30 pack-years of smoking and you currently smoke or have quit within the past 15 years. A pack-year is smoking an average of one pack of cigarettes per day for one year.  Yearly screening should:  Continue until it has been 15 years since you quit.  Stop if you develop a health problem that would prevent you from having lung cancer treatment.  Colorectal Cancer  This type of cancer can be detected and can often be  prevented.  Routine colorectal cancer screening usually begins at age 85 and continues through age 20.  If you have risk factors for colon cancer, your health care provider may recommend that you be screened at an earlier age.  If you have a family history of colorectal cancer, talk with your health care provider about genetic screening.  Your health care provider may also recommend using home test kits to check for hidden blood in your stool.  A small camera at the end of a tube can be used to examine your colon directly (sigmoidoscopy or colonoscopy). This is Stone to check for the earliest forms of colorectal cancer.  Direct examination of the colon should be repeated every 5-10 years until age 42. However, if early forms of precancerous polyps or small growths are found or if you have a family history or genetic risk for colorectal cancer, you may need to be screened more often.  Skin Cancer  Check your skin from head to toe regularly.  Monitor any moles. Be sure to tell your health care provider: ? About any new moles or changes in moles, especially if there is a change in a mole's shape or color. ? If you have a mole that is larger than the size of a pencil eraser.  If any of your family members has a history of skin cancer, especially at a young age, talk with your health care provider about genetic screening.  Always use sunscreen. Apply sunscreen liberally and repeatedly throughout the day.  Whenever you are outside, protect yourself by wearing long sleeves, pants, a wide-brimmed hat, and sunglasses.  What should I know about osteoporosis? Osteoporosis is a condition in which bone destruction happens more quickly than new bone creation. After menopause, you may be at an increased risk for osteoporosis. To help prevent osteoporosis or the bone fractures that can happen because of osteoporosis, the following is recommended:  If you are 59-13 years old, get at least 1,000 mg of  calcium and at least 600 mg of vitamin D per day.  If you are older than age 24 but younger than age 51, get at least 1,200 mg of calcium and at least 600 mg of vitamin D per day.  If you are older than age 23, get at least 1,200 mg of calcium and at least 800 mg of vitamin D per day.  Smoking and excessive alcohol intake increase the risk of osteoporosis. Eat foods that are rich in calcium and vitamin D, and do weight-bearing exercises several times each week as directed by your health care provider. What should I know about how menopause affects my mental health? Depression may occur at any age, but it is more common as you become older. Common symptoms of depression include:  Low  or sad mood.  Changes in sleep patterns.  Changes in appetite or eating patterns.  Feeling an overall lack of motivation or enjoyment of activities that you previously enjoyed.  Frequent crying spells.  Talk with your health care provider if you think that you are experiencing depression. What should I know about immunizations? It is important that you get and maintain your immunizations. These include:  Tetanus, diphtheria, and pertussis (Tdap) booster vaccine.  Influenza every year before the flu season begins.  Pneumonia vaccine.  Shingles vaccine.  Your health care provider may also recommend other immunizations. This information is not intended to replace advice given to you by your health care provider. Make sure you discuss any questions you have with your health care provider. Document Released: 08/20/2005 Document Revised: 01/16/2016 Document Reviewed: 04/01/2015 Elsevier Interactive Patient Education  2018 [employer].

## 2018-05-24 ENCOUNTER — Ambulatory Visit: Payer: Medicare Other

## 2018-06-01 ENCOUNTER — Ambulatory Visit: Payer: Medicare Other

## 2018-07-18 ENCOUNTER — Ambulatory Visit: Payer: Medicare Other | Admitting: Family Medicine

## 2018-07-31 ENCOUNTER — Other Ambulatory Visit: Payer: Self-pay | Admitting: Family Medicine

## 2018-08-02 ENCOUNTER — Ambulatory Visit (INDEPENDENT_AMBULATORY_CARE_PROVIDER_SITE_OTHER): Payer: Medicare Other

## 2018-08-02 DIAGNOSIS — Z1231 Encounter for screening mammogram for malignant neoplasm of breast: Secondary | ICD-10-CM | POA: Diagnosis not present

## 2018-08-15 ENCOUNTER — Encounter: Payer: Self-pay | Admitting: Family Medicine

## 2018-08-15 ENCOUNTER — Ambulatory Visit (INDEPENDENT_AMBULATORY_CARE_PROVIDER_SITE_OTHER): Payer: Medicare Other | Admitting: Family Medicine

## 2018-08-15 VITALS — BP 136/82 | HR 72 | Ht 62.0 in | Wt 158.0 lb

## 2018-08-15 DIAGNOSIS — L918 Other hypertrophic disorders of the skin: Secondary | ICD-10-CM | POA: Diagnosis not present

## 2018-08-15 DIAGNOSIS — D1809 Hemangioma of other sites: Secondary | ICD-10-CM | POA: Diagnosis not present

## 2018-08-15 DIAGNOSIS — L821 Other seborrheic keratosis: Secondary | ICD-10-CM | POA: Diagnosis not present

## 2018-08-15 NOTE — Progress Notes (Signed)
Subjective:    Patient ID: Stephanie Stone, female    DOB: [birth year], 68 y.o.   MRN: [id number]  HPI 68 year old female is here today to have a couple of lesions addressed underneath both breasts and on her right temple.  Have a history of basal cell carcinoma on her right temple which she had removed about 15 years ago.  The lesion under her right breast gets very irritated it is right along her underwire and gets very irritated and sometimes will bleed.   Review of Systems     Objective:   Physical Exam Vitals signs reviewed.  Constitutional:      Appearance: She is well-developed.  HENT:     Head: Normocephalic and atraumatic.  Eyes:     Conjunctiva/sclera: Conjunctivae normal.  Cardiovascular:     Rate and Rhythm: Normal rate.  Pulmonary:     Effort: Pulmonary effort is normal.  Skin:    General: Skin is dry.     Coloration: Skin is not pale.     Comments: She has an inflamed and swollen skin lesion underneath the right breast most consistent with a skin tag.  She has several smaller lesions underneath both breasts that are consistent with seborrheic keratoses.  And the skin lesion on her right temple is quite large approximately 2 cm in size and is consistent with a seborrheic keratosis that is slightly brown and raised with a rough texture.  Neurological:     Mental Status: She is alert and oriented to person, place, and time.  Psychiatric:        Behavior: Behavior normal.           Assessment & Plan:  Skin tag, inflamed-recommend shave biopsy.  Biopsy performed patient tolerated well.  Biopsy sent for pathology.  Seborrheic keratoses-recommended cryotherapy for more definitive treatment.  Patient tolerated well.  Cryotherapy Procedure Note  Pre-operative Diagnosis: seborrheic keratosis  Post-operative Diagnosis: same  Locations: bilateral breast and right temple  Indications: irritation  Anesthesia: not required    Procedure Details  Patient informed of  risks (permanent scarring, infection, light or dark discoloration, bleeding, infection, weakness, numbness and recurrence of the lesion) and benefits of the procedure and verbal informed consent obtained.  The areas are treated with liquid nitrogen therapy, frozen until ice ball extended 1-2 mm beyond lesion, allowed to thaw, and treated again. The patient tolerated procedure well.  The patient was instructed on post-op care, warned that there may be blister formation, redness and pain. Recommend OTC analgesia as needed for pain.  Condition: Stable  Complications: none.  Plan: 1. Instructed to keep the area dry and covered for 24-48h and clean thereafter. 2. Warning signs of infection were reviewed.   3. Recommended that the patient use OTC acetaminophen as needed for pain.  4. Return PRN.    Shave Biopsy Procedure Note  Pre-operative Diagnosis: inflammed skin tag  Post-operative Diagnosis: same  Locations:under right breast   Indications: irritation, bleeding   Anesthesia: Lidocaine 1% with epinephrine without added sodium bicarbonate  Procedure Details  Patient informed of the risks (including bleeding and infection) and benefits of the  procedure and Verbal informed consent obtained.  The lesion and surrounding area were given a sterile prep using chlorhexidine and draped in the usual sterile fashion. A scalpel was used to shave an area of skin approximately 17mm by 29mm.  Hemostasis achieved with alumuninum chloride. Antibiotic ointment and a sterile dressing applied.  The specimen was sent for pathologic examination.  The patient tolerated the procedure well.  EBL: 0 ml  Findings: awaith pathology  Condition: Stable  Complications: none.  Plan: 1. Instructed to keep the wound dry and covered for 24-48h and clean thereafter. 2. Warning signs of infection were reviewed.   3. Recommended that the patient use OTC acetaminophen as needed for pain.  4. Return PRN

## 2018-08-15 NOTE — Progress Notes (Signed)
7/2

## 2018-08-15 NOTE — Patient Instructions (Signed)
Keep wound covered until tomorrow morning if possible.  Okay to get wet in the shower.  Do not scrub at the area.  Pat dry.  Apply Vaseline twice a day and cover loosely with a Band-Aid if needed. At any point you are concerned about how the wound looks please let us know and we will be happy to take a look at it.

## 2019-03-26 ENCOUNTER — Other Ambulatory Visit: Payer: Self-pay | Admitting: Family Medicine

## 2019-03-28 ENCOUNTER — Telehealth: Payer: Self-pay | Admitting: Family Medicine

## 2019-03-28 NOTE — Telephone Encounter (Signed)
Message: The Prior Authorization request has been approved for [id number] [facility]. The authorization is valid from 02/26/2019 through 03/27/2020. A letter of explanation will also be mailed to the patient. Pharmacy aware.

## 2019-05-01 ENCOUNTER — Other Ambulatory Visit: Payer: Self-pay | Admitting: Family Medicine

## 2019-05-07 ENCOUNTER — Encounter: Payer: Self-pay | Admitting: Family Medicine

## 2019-05-07 ENCOUNTER — Other Ambulatory Visit: Payer: Self-pay

## 2019-05-07 ENCOUNTER — Ambulatory Visit (INDEPENDENT_AMBULATORY_CARE_PROVIDER_SITE_OTHER): Payer: Medicare Other | Admitting: Family Medicine

## 2019-05-07 DIAGNOSIS — Z23 Encounter for immunization: Secondary | ICD-10-CM | POA: Diagnosis not present

## 2019-05-07 DIAGNOSIS — E7800 Pure hypercholesterolemia, unspecified: Secondary | ICD-10-CM | POA: Diagnosis not present

## 2019-05-07 DIAGNOSIS — Z0000 Encounter for general adult medical examination without abnormal findings: Secondary | ICD-10-CM | POA: Diagnosis not present

## 2019-05-07 DIAGNOSIS — L659 Nonscarring hair loss, unspecified: Secondary | ICD-10-CM

## 2019-05-07 MED ORDER — MINOXIDIL 5 % EX SOLN: 1.0000 "application " | Freq: Every day | CUTANEOUS | 2 refills | Status: DC

## 2019-05-07 NOTE — Progress Notes (Signed)
Subjective:     Stephanie Stone is a 68 y.o. female and is here for a comprehensive physical exam. The patient reports problems - hair loss. She says it is mostly on the top and back of her head. Has been gradual but has been getting worse.  She denies any trauma or chemicals.  She would like a referral to dermatology.  She is active but does not have a regular exercise routine.  Social History   Socioeconomic History  . Marital status: Married    Spouse name: Sam  . Number of children: 2  . Years of education: 87  . Highest education level: 12th grade  Occupational History  . Occupation: retired    Comment: Psychologist, counselling  Social Needs  . Financial resource strain: Not hard at all  . Food insecurity    Worry: Never true    Inability: Never true  . Transportation needs    Medical: No    Non-medical: No  Tobacco Use  . Smoking status: Never Smoker  . Smokeless tobacco: Never Used  Substance and Sexual Activity  . Alcohol use: Yes    Alcohol/week: 1.0 standard drinks    Types: 1 Glasses of wine per week    Comment: once a month  . Drug use: No  . Sexual activity: Not Currently  Lifestyle  . Physical activity    Days per week: 0 days    Minutes per session: 0 min  . Stress: Not at all  Relationships  . Social connections    Talks on phone: More than three times a week    Gets together: Twice a week    Attends religious service: Never    Active member of club or organization: No    Attends meetings of clubs or organizations: Never    Relationship status: Married  . Intimate partner violence    Fear of current or ex partner: No    Emotionally abused: No    Physically abused: No    Forced sexual activity: No  Other Topics Concern  . Not on file  Social History Narrative   No regular exercise patient states having some stress with her son. Takes Prozac and this helps. Keeps grandchildren at home during the day   Health Maintenance  Topic Date Due  . MAMMOGRAM   08/02/2020  . Fecal DNA (Cologuard)  09/21/2020  . TETANUS/TDAP  12/11/2025  . INFLUENZA VACCINE  Completed  . DEXA SCAN  Completed  . Hepatitis C Screening  Completed  . PNA vac Low Risk Adult  Completed    The following portions of the patient's history were reviewed and updated as appropriate: allergies, current medications, past family history, past medical history, past social history, past surgical history and problem list.  Review of Systems A comprehensive review of systems was negative.   Objective:    There were no vitals taken for this visit. General appearance: alert, cooperative and appears stated age Head: Normocephalic, without obvious abnormality, atraumatic Eyes: conj clear, EOMI, PEERLA Ears: normal TM's and external ear canals both ears Nose: Nares normal. Septum midline. Mucosa normal. No drainage or sinus tenderness. Throat: lips, mucosa, and tongue normal; teeth and gums normal Neck: no adenopathy, no carotid bruit, no JVD, supple, symmetrical, trachea midline and thyroid not enlarged, symmetric, no tenderness/mass/nodules Back: symmetric, no curvature. ROM normal. No CVA tenderness. Lungs: clear to auscultation bilaterally Heart: regular rate and rhythm, S1, S2 normal, no murmur, click, rub or gallop Abdomen: soft, non-tender;  bowel sounds normal; no masses,  no organomegaly Extremities: extremities normal, atraumatic, no cyanosis or edema Pulses: 2+ and symmetric Skin: Skin color, texture, turgor normal. No rashes or lesions Lymph nodes: Cervical adenopathy: nl Neurologic: Alert and oriented X 3, normal strength and tone. Normal symmetric reflexes. Normal coordination and gait    Assessment:    Healthy female exam.      Plan:     See After Visit Summary for Counseling Recommendations   Keep up a regular exercise program and make sure you are eating a healthy diet Try to eat 4 servings of dairy a day, or if you are lactose intolerant take a calcium  with vitamin D daily.  Your vaccines are up to date.  Due for labs.    Hair Loss -assistant with androgenic alopecia.  Discussed treatment with topical Rogaine and possible finasteride.  We will start the Rogaine for now and then go ahead and refer her to dermatology.

## 2019-05-21 DIAGNOSIS — L648 Other androgenic alopecia: Secondary | ICD-10-CM | POA: Diagnosis not present

## 2019-05-21 DIAGNOSIS — L649 Androgenic alopecia, unspecified: Secondary | ICD-10-CM | POA: Diagnosis not present

## 2019-05-28 ENCOUNTER — Ambulatory Visit: Payer: Medicare Other

## 2019-06-14 ENCOUNTER — Other Ambulatory Visit: Payer: Self-pay

## 2019-06-14 DIAGNOSIS — Z20822 Contact with and (suspected) exposure to covid-19: Secondary | ICD-10-CM

## 2019-06-14 DIAGNOSIS — Z20828 Contact with and (suspected) exposure to other viral communicable diseases: Secondary | ICD-10-CM | POA: Diagnosis not present

## 2019-06-15 ENCOUNTER — Ambulatory Visit: Payer: Federal, State, Local not specified - PPO | Admitting: Physician Assistant

## 2019-06-17 LAB — NOVEL CORONAVIRUS, NAA: SARS-CoV-2, NAA: NOT DETECTED

## 2019-07-25 NOTE — Progress Notes (Signed)
Subjective:   Stephanie Stone is a 69 y.o. female who presents for Medicare Annual (Subsequent) preventive examination.  Review of Systems:  No ROS.  Medicare Wellness Virtual Visit.  Visual/audio telehealth visit, UTA vital signs.   See social history for additional risk factors.    Cardiac Risk Factors include: advanced age (>43men, >15 women);dyslipidemia;sedentary lifestyle Sleep patterns: Getting 5 hours of sleep a night on average. Wakes up 1 time a night to void.Wakes up and feels sluggish but sometimes feels rested.  Home Safety/Smoke Alarms: Feels safe in home. Smoke alarms in place.  Living environment; Lives with husband in a 2 story home and stairs have hand rails on them. Shower is a walk in shower and no grab bars in place. Seat Belt Safety/Bike Helmet: Wears seat belt.   Female:   Pap- Aged out      Mammo- UTD      Dexa scan- UTD       CCS- UTD    Objective:     Vitals: There were no vitals taken for this visit.  There is no height or weight on file to calculate BMI.  Advanced Directives 07/31/2019 05/17/2018 05/02/2017  Does Patient Have a Medical Advance Directive? No No No  Would patient like information on creating a medical advance directive? No - Patient declined Yes (MAU/Ambulatory/Procedural Areas - Information given) Yes (MAU/Ambulatory/Procedural Areas - Information given)    Tobacco Social History   Tobacco Use  Smoking Status Never Smoker  Smokeless Tobacco Never Used     Counseling given: Not Answered   Clinical Intake:  Pre-visit preparation completed: Yes  Pain : No/denies pain     Nutritional Risks: None Diabetes: No  How often do you need to have someone help you when you read instructions, pamphlets, or other written materials from your doctor or pharmacy?: 1 - Never What is the last grade level you completed in school?: 12  Interpreter Needed?: No  Information entered by :: Orlie Dakin, LPN  Past Medical History:  Diagnosis Date   . Alopecia    areata  . Depression   . Hyperlipidemia   . Hypertension   . Lung infiltrate 2010   Resolved on its own without biopsy  . Menopause, premature   . Migraines   . MVP (mitral valve prolapse)    Past Surgical History:  Procedure Laterality Date  . ABDOMINAL HYSTERECTOMY  1979   endometriosis  . APPENDECTOMY     Family History  Problem Relation Age of Onset  . Heart attack Father   . Heart attack Mother   . Hypertension Mother   . Coronary artery disease Mother 45  . Liver cancer Mother   . Heart attack Brother 53  . Stroke Brother    Social History   Socioeconomic History  . Marital status: Married    Spouse name: Sam  . Number of children: 2  . Years of education: 36  . Highest education level: 12th grade  Occupational History  . Occupation: retired    Comment: Psychologist, counselling  Tobacco Use  . Smoking status: Never Smoker  . Smokeless tobacco: Never Used  Substance and Sexual Activity  . Alcohol use: Yes    Alcohol/week: 1.0 standard drinks    Types: 1 Glasses of wine per week    Comment: 1/2 glass of wine a year  . Drug use: No  . Sexual activity: Not Currently  Other Topics Concern  . Not on file  Social History Narrative  No regular exercise patient states having some stress with her son. Takes Prozac and this helps. Keeps grandchildren at home during the day   Social Determinants of Health   Financial Resource Strain:   . Difficulty of Paying Living Expenses: Not on file  Food Insecurity:   . Worried About Charity fundraiser in the Last Year: Not on file  . Ran Out of Food in the Last Year: Not on file  Transportation Needs:   . Lack of Transportation (Medical): Not on file  . Lack of Transportation (Non-Medical): Not on file  Physical Activity:   . Days of Exercise per Week: Not on file  . Minutes of Exercise per Session: Not on file  Stress:   . Feeling of Stress : Not on file  Social Connections:   . Frequency of Communication  with Friends and Family: Not on file  . Frequency of Social Gatherings with Friends and Family: Not on file  . Attends Religious Services: Not on file  . Active Member of Clubs or Organizations: Not on file  . Attends Archivist Meetings: Not on file  . Marital Status: Not on file    Outpatient Encounter Medications as of 07/31/2019  Medication Sig  . aspirin 81 MG EC tablet Take 81 mg by mouth daily.    . Biotin 1000 MCG tablet Take 1,000 mcg by mouth daily.  . cholecalciferol (VITAMIN D3) 25 MCG (1000 UNIT) tablet Take 1,000 Units by mouth daily.  Marland Kitchen eletriptan (RELPAX) 20 MG tablet Take 1 tablet (20 mg total) by mouth as needed. may repeat in 2 hours if necessary  . fluocinonide (LIDEX) 0.05 % external solution Apply 1 application topically at bedtime.  . fluticasone (FLONASE) 50 MCG/ACT nasal spray SHAKE LIQUID AND USE 2 SPRAYS IN EACH NOSTRIL DAILY  . LINZESS 145 MCG CAPS capsule TAKE 1 CAPSULE(145 MCG) BY MOUTH DAILY  . MINOXIDIL, TOPICAL, 5 % SOLN Apply 1 application topically at bedtime.  . Zinc 22.5 MG TABS Take by mouth.  Marland Kitchen FLUoxetine (PROZAC) 20 MG tablet TAKE 1 TABLET BY MOUTH DAILY (Patient not taking: Reported on 07/31/2019)   No facility-administered encounter medications on file as of 07/31/2019.    Activities of Daily Living In your present state of health, do you have any difficulty performing the following activities: 07/31/2019  Hearing? Y  Comment has noticed some hearing loss over the last year  Vision? N  Difficulty concentrating or making decisions? N  Walking or climbing stairs? N  Dressing or bathing? N  Doing errands, shopping? N  Preparing Food and eating ? N  Using the Toilet? N  In the past six months, have you accidently leaked urine? Y  Comment wears a panti liner in case coughs  Do you have problems with loss of bowel control? N  Managing your Medications? N  Managing your Finances? N  Housekeeping or managing your Housekeeping? N  Some  recent data might be hidden    Patient Care Team: [name], MD as PCP - General    Assessment:   This is a routine wellness examination for Ashyla.Physical assessment deferred to PCP.   Exercise Activities and Dietary recommendations Current Exercise Habits: The patient does not participate in regular exercise at present, Exercise limited by: None identified Diet Eats a fairly healthy diet of vegetables, fruits and proteins. Breakfast: Yogurt Lunch: skips Dinner: Soup or meat and veggies. Drinks 24 ounces of water a day.  Goals    . Exercise 3x per week (30 min per time)     Increase exercise to at least 30 minutes a day 3 times a week    . Exercise 3x per week (30 min per time)     Start exercising and loose weight.       Fall Risk Fall Risk  07/31/2019 05/07/2019 05/17/2018 11/09/2017 05/02/2017  Falls in the past year? 0 0 0 No No  Number falls in past yr: - 0 - - -  Injury with Fall? 0 0 - - -  Risk for fall due to : No Fall Risks - - - -  Follow up Falls prevention discussed - Falls prevention discussed - -   Is the patient's home free of loose throw rugs in walkways, pet beds, electrical cords, etc?   yes      Grab bars in the bathroom? no      Handrails on the stairs?   yes      Adequate lighting?   yes  Depression Screen PHQ 2/9 Scores 07/31/2019 05/07/2019 08/15/2018 05/17/2018  PHQ - 2 Score 0 0 0 0  PHQ- 9 Score - - - -     Cognitive Function     6CIT Screen 07/31/2019 05/17/2018 05/02/2017 05/27/2016  What Year? 0 points 0 points 0 points 0 points  What month? 0 points 0 points 0 points 0 points  What time? 0 points 0 points 0 points 0 points  Count back from 20 0 points 0 points 0 points 0 points  Months in reverse 0 points 0 points 0 points 0 points  Repeat phrase 0 points 2 points 2 points 4 points  Total Score 0 2 2 4     Immunization History  Administered Date(s) Administered  . Fluad Quad(high Dose 65+) 05/07/2019  . Influenza, High  Dose Seasonal PF 05/02/2017  . Influenza,inj,Quad PF,6+ Mos 04/20/2013, 05/27/2016, 05/17/2018  . Influenza-Unspecified 04/03/2014  . Pneumococcal Conjugate-13 05/02/2017  . Pneumococcal Polysaccharide-23 05/17/2018  . Td 07/13/2003  . Tdap 12/12/2015    Screening Tests Health Maintenance  Topic Date Due  . MAMMOGRAM  08/02/2020  . Fecal DNA (Cologuard)  09/21/2020  . TETANUS/TDAP  12/11/2025  . INFLUENZA VACCINE  Completed  . DEXA SCAN  Completed  . Hepatitis C Screening  Completed  . PNA vac Low Risk Adult  Completed      Plan:    Please schedule your next medicare wellness visit with me in 1 yr.  Ms. Hassell Done , Thank you for taking time to come for your Medicare Wellness Visit. I appreciate your ongoing commitment to your health goals. Please review the following plan we discussed and let me know if I can assist you in the future.  Continue doing brain stimulating activities (puzzles, reading, adult coloring books, staying active) to keep memory sharp.    These are the goals we discussed: Goals    . Exercise 3x per week (30 min per time)     Increase exercise to at least 30 minutes a day 3 times a week    . Exercise 3x per week (30 min per time)     Start exercising and loose weight.       This is a list of the screening recommended for you and due dates:  Health Maintenance  Topic Date Due  . Mammogram  08/02/2020  . Cologuard (Stool DNA test)  09/21/2020  . Tetanus Vaccine  12/11/2025  . Flu Shot  Completed  . DEXA scan (bone density measurement)  Completed  .  Hepatitis C: One time screening is recommended by Center for Disease Control  (CDC) for  adults born from [birth year] through 1965.   Completed  . Pneumonia vaccines  Completed      I have personally reviewed and noted the following in the patient's chart:   . Medical and social history . Use of alcohol, tobacco or illicit drugs  . Current medications and supplements . Functional ability and  status . Nutritional status . Physical activity . Advanced directives . List of other physicians . Hospitalizations, surgeries, and ER visits in previous 12 months . Vitals . Screenings to include cognitive, depression, and falls . Referrals and appointments  In addition, I have reviewed and discussed with patient certain preventive protocols, quality metrics, and best practice recommendations. A written personalized care plan for preventive services as well as general preventive health recommendations were provided to patient.     Joanne Chars, LPN  [id number]

## 2019-07-31 ENCOUNTER — Ambulatory Visit (INDEPENDENT_AMBULATORY_CARE_PROVIDER_SITE_OTHER): Payer: Medicare Other | Admitting: *Deleted

## 2019-07-31 VITALS — Ht 62.0 in | Wt 158.0 lb

## 2019-07-31 DIAGNOSIS — Z0000 Encounter for general adult medical examination without abnormal findings: Secondary | ICD-10-CM

## 2019-07-31 MED ORDER — ELETRIPTAN HYDROBROMIDE 20 MG PO TABS: 20.0000 mg | ORAL_TABLET | ORAL | 3 refills | Status: DC | PRN

## 2019-07-31 NOTE — Patient Instructions (Signed)
Please schedule your next medicare wellness visit with me in 1 yr.  Ms. Hassell Done , Thank you for taking time to come for your Medicare Wellness Visit. I appreciate your ongoing commitment to your health goals. Please review the following plan we discussed and let me know if I can assist you in the future.  Continue doing brain stimulating activities (puzzles, reading, adult coloring books, staying active) to keep memory sharp.  These are the goals we discussed: Goals    . Exercise 3x per week (30 min per time)     Increase exercise to at least 30 minutes a day 3 times a week    . Exercise 3x per week (30 min per time)     Start exercising and loose weight.

## 2019-08-07 ENCOUNTER — Encounter: Payer: Self-pay | Admitting: Physician Assistant

## 2019-08-07 ENCOUNTER — Other Ambulatory Visit: Payer: Self-pay

## 2019-08-07 ENCOUNTER — Ambulatory Visit (INDEPENDENT_AMBULATORY_CARE_PROVIDER_SITE_OTHER): Payer: Medicare Other | Admitting: Physician Assistant

## 2019-08-07 VITALS — BP 157/88 | HR 67 | Ht 62.0 in | Wt 167.0 lb

## 2019-08-07 DIAGNOSIS — M5481 Occipital neuralgia: Secondary | ICD-10-CM | POA: Diagnosis not present

## 2019-08-07 DIAGNOSIS — R030 Elevated blood-pressure reading, without diagnosis of hypertension: Secondary | ICD-10-CM | POA: Diagnosis not present

## 2019-08-07 DIAGNOSIS — Z8669 Personal history of other diseases of the nervous system and sense organs: Secondary | ICD-10-CM | POA: Diagnosis not present

## 2019-08-07 DIAGNOSIS — R29818 Other symptoms and signs involving the nervous system: Secondary | ICD-10-CM | POA: Diagnosis not present

## 2019-08-07 DIAGNOSIS — R52 Pain, unspecified: Secondary | ICD-10-CM | POA: Diagnosis not present

## 2019-08-07 DIAGNOSIS — M5030 Other cervical disc degeneration, unspecified cervical region: Secondary | ICD-10-CM | POA: Diagnosis not present

## 2019-08-07 DIAGNOSIS — R519 Headache, unspecified: Secondary | ICD-10-CM | POA: Diagnosis not present

## 2019-08-07 MED ORDER — GABAPENTIN 100 MG PO CAPS: 100.0000 mg | ORAL_CAPSULE | Freq: Three times a day (TID) | ORAL | 0 refills | Status: DC

## 2019-08-07 NOTE — Progress Notes (Addendum)
Subjective:    Patient ID: Stephanie Stone, female    DOB: Feb 10, 1951, 69 y.o.   MRN: [id number]  HPI  Patient is a 69 year old female with a history of migraines without Stephanie who presents to the clinic with shooting pain on the right side of her head and behind her right ear.  She actually describe episodes of these pains dating back to 2016 when she worked at [employer].  She remembers mentioning it to a provider there and suspecting it was some sort of nerve pain.  This is gone on since then intermittently.  At times she would go 6 months without having any symptoms.  She is noticing the symptoms occurring more often over the last few months.  She is now starting to have these episodes at least once a week and they are more painful and last longer.  Yesterday was the worst when she had ever had.  The pain started in the right occipital and parietal area and radiated behind into the mastoid bone.  She was tender to touch her hair and scalp in all areas and around her ear.  She cannot wear her eyeglasses or let anything touch her ears.  She denies any fever, chills, sinus pressure, ear pain, rash, shortness of breath or vision changes.  She does notice her vision is a little blurry when she is having the shooting pain.  She is also noticed at times some droopiness of her right eye when she is hurting as well as some blurriness. She also struggles to talk or even chew or move at all when the pain is occurring.  She would rate the pain 10 out of 10.  Yesterday she took 3 ibuprofen and 2 Tylenol 1 time and it eased up.  She thought that Relpax could help and she has taken that once but there is no benefit.  She admits to not taking it at onset but rather wants the pain had started up.  This morning she woke up and did not hurt.  She has noticed a dull pain in that area this morning she already took 4 ibuprofen.  She does admit to being more stressed recently.  She also admits to having some  cervical neck pain in the past as well as disc herniation.  At 1 point she did have an epidural injection.  All her symptoms with her neck have been left-sided not right.  She denies any recent trauma to her head or neck.  She denies any problems with range of motion.  She denies any numbness or tingling radiating into her arms at this time.  Before this is was having one typical migraine a year.    .. Active Ambulatory Problems    Diagnosis Date Noted  . MENOPAUSE, PREMATURE 02/17/2007  . Migraine headache 02/17/2007  . MITRAL VALVE PROLAPSE 02/17/2007  . ALOPECIA AREATA 05/29/2010  . ELEVATED BLOOD PRESSURE WITHOUT DIAGNOSIS OF HYPERTENSION 07/08/2010  . Hyperlipidemia 08/11/2010  . DEPRESSION, SITUATIONAL 08/11/2010  . Slow transit constipation 10/01/2014  . Degenerative disc disease, cervical 08/07/2019  . Shooting pain 08/07/2019  . History of migraine 08/07/2019  . Occipital neuralgia of right side 08/07/2019   Resolved Ambulatory Problems    Diagnosis Date Noted  . STRESS REACTION, ACUTE 05/29/2010  . Acute cystitis 05/29/2010  . WRIST PAIN 02/17/2007  . FOOT PAIN 12/05/2008  . CHEST PAIN UNSPECIFIED 08/12/2010   Past Medical History:  Diagnosis Date  . Alopecia   . Depression   .  Hypertension   . Lung infiltrate 2010  . Menopause, premature   . Migraines   . MVP (mitral valve prolapse)        Review of Systems See HPI.     Objective:   Physical Exam Vitals reviewed.  Constitutional:      Appearance: Normal appearance.  HENT:     Head: Normocephalic.     Comments: Tenderness to palpate over scalp.  No rash.  No temple tenderness.  No tenderness over maxillary sinuses.    Right Ear: Tympanic membrane, ear canal and external ear normal. There is no impacted cerumen.     Left Ear: Tympanic membrane, ear canal and external ear normal. There is no impacted cerumen.     Ears:     Comments: No tenderness over mastoid    Mouth/Throat:     Mouth: Mucous  membranes are moist.  Eyes:     General:        Right eye: No discharge.        Left eye: No discharge.     Extraocular Movements: Extraocular movements intact.     Pupils: Pupils are equal, round, and reactive to light.  Cardiovascular:     Rate and Rhythm: Normal rate and regular rhythm.  Pulmonary:     Effort: Pulmonary effort is normal.  Musculoskeletal:        General: Normal range of motion.     Cervical back: Normal range of motion and neck supple.     Comments: No tenderness to palpation over cspine.    Neurological:     General: No focal deficit present.     Mental Status: She is alert and oriented to person, place, and time.     Cranial Nerves: No cranial nerve deficit.     Motor: No weakness.     Coordination: Coordination normal.     Gait: Gait normal.           Assessment & Plan:  Marland KitchenMarland KitchenRya was seen today for ear pain.  Diagnoses and all orders for this visit:  Occipital neuralgia of right side -     gabapentin (NEURONTIN) 100 MG capsule; Take 1 capsule (100 mg total) by mouth 3 (three) times daily. -     Sed Rate (ESR) -     Ambulatory referral to Neurology  History of migraine -     Ambulatory referral to Neurology  Shooting pain -     gabapentin (NEURONTIN) 100 MG capsule; Take 1 capsule (100 mg total) by mouth 3 (three) times daily. -     Sed Rate (ESR)  Degenerative disc disease, cervical  Intractable headache, unspecified chronicity pattern, unspecified headache type -     MR Brain Wo Contrast  Other symptoms and signs involving the nervous system -     MR Brain Wo Contrast  ELEVATED BLOOD PRESSURE WITHOUT DIAGNOSIS OF HYPERTENSION   Symptoms present a lot like occipital neuralgia.  I do think she would benefit from a neurology consult.  Certainly if this is the case she could benefit from occipital nerve block.  She does have a history of migraines.  This could be an atypical presentation of migraines.  I would like for her to try her Relpax  at onset of headache and not let it get away with her.  Let us see if this benefits her at all.  I did go ahead and add an ESR to her blood work that she is supposed to have done from her  PCP.  There is no temporal or trigeminal pain to palpation.  I did go ahead and start gabapentin to use as needed and we can titrate if beneficial.  She can continue using Tylenol and ibuprofen as well.  Certainly we cannot exclude some neck pathology since she does have a history of cervical degenerative disc at C5/6/7 however her pain now does not really correlate to that.  Since this has been ongoing since 2016 and with a history of migraines with some vision changes/eye drooping when having headache would like to get MRI.   BP elevated today. Keep log and follow up with metheney Pcp in 2 weeks.

## 2019-08-07 NOTE — Patient Instructions (Signed)
Occipital Neuralgia  Occipital neuralgia is a type of headache that causes brief episodes of very bad pain in the back of your head. Pain from occipital neuralgia may spread (radiate) to other parts of your head. These headaches may be caused by irritation of the nerves that leave your spinal cord high up in your neck, just below the base of your skull (occipital nerves). Your occipital nerves transmit sensations from the back of your head, the top of your head, and the areas behind your ears. What are the causes? This condition can occur without any known cause (primary headache syndrome). In other cases, this condition is caused by pressure on or irritation of one of the two occipital nerves. Pressure and irritation may be due to:  Muscle spasm in the neck.  Neck injury.  Wear and tear of the vertebrae in the neck (osteoarthritis).  Disease of the disks that separate the vertebrae.  Swollen blood vessels that put pressure on the occipital nerves.  Infections.  Tumors.  Diabetes. What are the signs or symptoms? This condition causes brief burning, stabbing, electric, shocking, or shooting pain which can radiate to the top of the head. It can happen on one side or both sides of the head. It can also cause:  Pain behind the eye.  Pain triggered by neck movement or hair brushing.  Scalp tenderness.  Aching in the back of the head between episodes of very bad pain.  Pain gets worse with exposure to bright lights. How is this diagnosed? There is no test that diagnoses this condition. Your health care provider may diagnose this condition based on a physical exam and your symptoms. Other tests may be done, such as:  Imaging studies of the brain and neck (cervical spine), such as an MRI or CT scan. These look for causes of pinched nerves.  Applying pressure to the nerves in the neck to try to re-create the pain.  Injection of numbing medicine into the occipital nerve areas to see if  pain goes away (diagnostic nerve block). How is this treated? Treatment for this condition may begin with simple measures, such as:  Rest.  Massage.  Applying heat or cold on the area.  Over-the-counter pain relievers. If these measures do not work, you may need other treatments, including:  Medicines, such as: ? Prescription-strength anti-inflammatory medicines. ? Muscle relaxants. ? Anti-seizure medicines, which can relieve pain. ? Antidepressants, which can relieve pain. ? Injected medicines, such as medicines that numb the area (local anesthetic) and steroids.  Pulsed radiofrequency ablation. This is when wires are implanted to deliver electrical impulses that block pain signals from the occipital nerve.  Surgery to relieve nerve pressure.  Physical therapy. Follow these instructions at home: Pain management      Avoid any activities that cause pain.  Rest when you have an attack of pain.  Try gentle massage to relieve pain.  Try a different pillow or sleeping position.  If directed, apply heat to the affected area as told by your health care provider. Use the heat source that your health care provider recommends, such as a moist heat pack or a heating pad. ? Place a towel between your skin and the heat source. ? Leave the heat on for 20-30 minutes. ? Remove the heat if your skin turns bright red. This is especially important if you are unable to feel pain, heat, or cold. You may have a greater risk of getting burned.  If directed, apply ice to the   back of the head and neck area as told by your health care provider. ? Put ice in a plastic bag. ? Place a towel between your skin and the bag. ? Leave the ice on for 20 minutes, 2-3 times per day. General instructions  Take over-the-counter and prescription medicines only as told by your health care provider.  Avoid things that make your symptoms worse, such as bright lights.  Try to stay active. Get regular  exercise that does not cause pain. Ask your health care provider to suggest safe exercises for you.  Work with a physical therapist to learn stretching exercises you can do at home.  Practice good posture.  Keep all follow-up visits as told by your health care provider. This is important. Contact a health care provider if:  Your medicine is not working.  You have new or worsening symptoms. Get help right away if:  You have very bad head pain that does not go away.  You have a sudden change in vision, balance, or speech. Summary  Occipital neuralgia is a type of headache that causes brief episodes of very bad pain in the back of your head.  Pain from occipital neuralgia may spread (radiate) to other parts of your head.  Treatment for this condition includes rest, massage, and medicines. This information is not intended to replace advice given to you by your health care provider. Make sure you discuss any questions you have with your health care provider. Document Revised: 06/14/2017 Document Reviewed: 09/02/2016 Elsevier Patient Education  2020 [employer]  

## 2019-08-08 ENCOUNTER — Encounter: Payer: Self-pay | Admitting: Physician Assistant

## 2019-08-09 NOTE — Telephone Encounter (Signed)
Sending referral to [facility] Dr. Phineas Semen as patient requested. - CF

## 2019-08-12 ENCOUNTER — Other Ambulatory Visit: Payer: Self-pay | Admitting: Family Medicine

## 2019-08-12 DIAGNOSIS — Z1231 Encounter for screening mammogram for malignant neoplasm of breast: Secondary | ICD-10-CM

## 2019-08-13 ENCOUNTER — Other Ambulatory Visit: Payer: Self-pay

## 2019-08-13 ENCOUNTER — Ambulatory Visit (INDEPENDENT_AMBULATORY_CARE_PROVIDER_SITE_OTHER): Payer: Medicare Other

## 2019-08-13 DIAGNOSIS — R29818 Other symptoms and signs involving the nervous system: Secondary | ICD-10-CM

## 2019-08-13 DIAGNOSIS — R519 Headache, unspecified: Secondary | ICD-10-CM

## 2019-08-13 NOTE — Progress Notes (Signed)
[place] [facility] news. Brain has normal appearance. Nothing to explain symptoms. Are you still having episodes?   Luvenia Starch

## 2019-08-16 ENCOUNTER — Ambulatory Visit (INDEPENDENT_AMBULATORY_CARE_PROVIDER_SITE_OTHER): Payer: Medicare Other

## 2019-08-16 ENCOUNTER — Other Ambulatory Visit: Payer: Self-pay

## 2019-08-16 DIAGNOSIS — Z1231 Encounter for screening mammogram for malignant neoplasm of breast: Secondary | ICD-10-CM | POA: Diagnosis not present

## 2019-09-10 DIAGNOSIS — Z23 Encounter for immunization: Secondary | ICD-10-CM | POA: Diagnosis not present

## 2019-09-19 DIAGNOSIS — L648 Other androgenic alopecia: Secondary | ICD-10-CM | POA: Diagnosis not present

## 2019-11-02 DIAGNOSIS — R519 Headache, unspecified: Secondary | ICD-10-CM | POA: Diagnosis not present

## 2020-01-07 ENCOUNTER — Other Ambulatory Visit: Payer: Self-pay | Admitting: Family Medicine

## 2020-01-07 DIAGNOSIS — F4321 Adjustment disorder with depressed mood: Secondary | ICD-10-CM

## 2020-01-07 NOTE — Telephone Encounter (Signed)
Last refill-  05/01/19  Last ov- 08/07/19 with Luvenia Starch not pertaining to this medication last wellness exam 05/07/19 with PCP.

## 2020-01-18 ENCOUNTER — Other Ambulatory Visit: Payer: Self-pay | Admitting: *Deleted

## 2020-01-18 DIAGNOSIS — M5481 Occipital neuralgia: Secondary | ICD-10-CM

## 2020-01-18 DIAGNOSIS — R52 Pain, unspecified: Secondary | ICD-10-CM

## 2020-01-18 MED ORDER — GABAPENTIN 100 MG PO CAPS: 100.0000 mg | ORAL_CAPSULE | Freq: Three times a day (TID) | ORAL | 0 refills | Status: DC

## 2020-02-03 ENCOUNTER — Other Ambulatory Visit: Payer: Self-pay | Admitting: Family Medicine

## 2020-02-09 DIAGNOSIS — Z20822 Contact with and (suspected) exposure to covid-19: Secondary | ICD-10-CM | POA: Diagnosis not present

## 2020-02-09 DIAGNOSIS — R05 Cough: Secondary | ICD-10-CM | POA: Diagnosis not present

## 2020-02-24 ENCOUNTER — Other Ambulatory Visit: Payer: Self-pay | Admitting: Family Medicine

## 2020-02-24 DIAGNOSIS — M5481 Occipital neuralgia: Secondary | ICD-10-CM

## 2020-02-24 DIAGNOSIS — R52 Pain, unspecified: Secondary | ICD-10-CM

## 2020-04-24 ENCOUNTER — Other Ambulatory Visit: Payer: Self-pay | Admitting: Family Medicine

## 2020-05-01 ENCOUNTER — Telehealth: Payer: Self-pay

## 2020-05-01 NOTE — Telephone Encounter (Signed)
PA for Linzess approved.

## 2020-05-07 ENCOUNTER — Ambulatory Visit (INDEPENDENT_AMBULATORY_CARE_PROVIDER_SITE_OTHER): Payer: Medicare Other | Admitting: Family Medicine

## 2020-05-07 ENCOUNTER — Other Ambulatory Visit: Payer: Self-pay

## 2020-05-07 ENCOUNTER — Encounter: Payer: Self-pay | Admitting: Family Medicine

## 2020-05-07 VITALS — BP 136/80 | HR 75 | Ht 62.0 in | Wt 161.0 lb

## 2020-05-07 DIAGNOSIS — Z23 Encounter for immunization: Secondary | ICD-10-CM

## 2020-05-07 DIAGNOSIS — I341 Nonrheumatic mitral (valve) prolapse: Secondary | ICD-10-CM | POA: Diagnosis not present

## 2020-05-07 DIAGNOSIS — Z0000 Encounter for general adult medical examination without abnormal findings: Secondary | ICD-10-CM

## 2020-05-07 DIAGNOSIS — R030 Elevated blood-pressure reading, without diagnosis of hypertension: Secondary | ICD-10-CM

## 2020-05-07 DIAGNOSIS — R0602 Shortness of breath: Secondary | ICD-10-CM

## 2020-05-07 DIAGNOSIS — E7800 Pure hypercholesterolemia, unspecified: Secondary | ICD-10-CM | POA: Diagnosis not present

## 2020-05-07 NOTE — Progress Notes (Signed)
Subjective:     Stephanie Stone is a 69 y.o. female and is here for a comprehensive physical exam.   The patient reports problems - SOB. Had episode of SOB while [place]. She doesn't exercise regularly but says she doesn't usually get that out of breath and says it wasn't an intense hike. No CP with it. No wheezing.  Says occ will get a sharp pain in her chest that just last a second or two that radiates toward her shoulders.  She wants to start working on weight loss and it planning on starting a diet program.  She thinks she had a stress test about 8 yr ago.  She is worried about heart disease bc runs in her family.   Social History   Socioeconomic History  . Marital status: Married    Spouse name: Sam  . Number of children: 2  . Years of education: 15  . Highest education level: 12th grade  Occupational History  . Occupation: retired    Comment: Psychologist, counselling  Tobacco Use  . Smoking status: Never Smoker  . Smokeless tobacco: Never Used  Vaping Use  . Vaping Use: Never used  Substance and Sexual Activity  . Alcohol use: Yes    Alcohol/week: 1.0 standard drink    Types: 1 Glasses of wine per week    Comment: 1/2 glass of wine a year  . Drug use: No  . Sexual activity: Not Currently  Other Topics Concern  . Not on file  Social History Narrative   No regular exercise patient states having some stress with her son. Takes Prozac and this helps. Keeps grandchildren at home during the day   Social Determinants of Health   Financial Resource Strain:   . Difficulty of Paying Living Expenses: Not on file  Food Insecurity:   . Worried About Charity fundraiser in the Last Year: Not on file  . Ran Out of Food in the Last Year: Not on file  Transportation Needs:   . Lack of Transportation (Medical): Not on file  . Lack of Transportation (Non-Medical): Not on file  Physical Activity:   . Days of Exercise per Week: Not on file  . Minutes of Exercise per Session: Not on  file  Stress:   . Feeling of Stress : Not on file  Social Connections:   . Frequency of Communication with Friends and Family: Not on file  . Frequency of Social Gatherings with Friends and Family: Not on file  . Attends Religious Services: Not on file  . Active Member of Clubs or Organizations: Not on file  . Attends Archivist Meetings: Not on file  . Marital Status: Not on file  Intimate Partner Violence:   . Fear of Current or Ex-Partner: Not on file  . Emotionally Abused: Not on file  . Physically Abused: Not on file  . Sexually Abused: Not on file   Health Maintenance  Topic Date Due  . Fecal DNA (Cologuard)  09/21/2020  . MAMMOGRAM  08/15/2021  . TETANUS/TDAP  12/11/2025  . INFLUENZA VACCINE  Completed  . DEXA SCAN  Completed  . COVID-19 Vaccine  Completed  . Hepatitis C Screening  Completed  . PNA vac Low Risk Adult  Completed    The following portions of the patient's history were reviewed and updated as appropriate: allergies, current medications, past family history, past medical history, past social history, past surgical history and problem list.  Review of  Systems A comprehensive review of systems was negative.   Objective:    BP 136/80   Pulse 75   Ht 5\' 2"  (1.575 m)   Wt 161 lb (73 kg)   SpO2 97%   BMI 29.45 kg/m  General appearance: alert, cooperative and appears stated age Head: Normocephalic, without obvious abnormality, atraumatic Eyes: conj clear, EOMi, PEERLA Ears: normal TM's and external ear canals both ears Nose: Nares normal. Septum midline. Mucosa normal. No drainage or sinus tenderness. Throat: lips, mucosa, and tongue normal; teeth and gums normal Neck: no adenopathy, no carotid bruit, no JVD, supple, symmetrical, trachea midline and thyroid not enlarged, symmetric, no tenderness/mass/nodules Back: symmetric, no curvature. ROM normal. No CVA tenderness. Lungs: clear to auscultation bilaterally Heart: regular rate and rhythm,  S1, S2 normal, no murmur, click, rub or gallop Abdomen: soft, non-tender; bowel sounds normal; no masses,  no organomegaly Extremities: extremities normal, atraumatic, no cyanosis or edema Pulses: 2+ and symmetric Skin: Skin color, texture, turgor normal. No rashes or lesions Lymph nodes: Cervical, supraclavicular, and axillary nodes normal. Neurologic: Alert and oriented X 3, normal strength and tone. Normal symmetric reflexes. Normal coordination and gait    Assessment:    Healthy female exam.      Plan:     See After Visit Summary for Counseling Recommendations   Keep up a regular exercise program and make sure you are eating a healthy diet Try to eat 4 servings of dairy a day, or if you are lactose intolerant take a calcium with vitamin D daily.  Your vaccines are up to date.   SOB - discussed further work up. Due for labs. Will check for anemia and thyroid disorder. Discussed echo since history of MVP.  If normal consider stress test for further work-up.  Lung exam is normal.  Consider chest xray.

## 2020-05-17 DIAGNOSIS — Z20822 Contact with and (suspected) exposure to covid-19: Secondary | ICD-10-CM | POA: Diagnosis not present

## 2020-05-17 DIAGNOSIS — J029 Acute pharyngitis, unspecified: Secondary | ICD-10-CM | POA: Diagnosis not present

## 2020-05-17 DIAGNOSIS — R059 Cough, unspecified: Secondary | ICD-10-CM | POA: Diagnosis not present

## 2020-05-20 ENCOUNTER — Encounter: Payer: Self-pay | Admitting: Physician Assistant

## 2020-05-20 ENCOUNTER — Telehealth (INDEPENDENT_AMBULATORY_CARE_PROVIDER_SITE_OTHER): Payer: Medicare Other | Admitting: Physician Assistant

## 2020-05-20 VITALS — Temp 99.2°F | Ht 62.0 in | Wt 161.0 lb

## 2020-05-20 DIAGNOSIS — J40 Bronchitis, not specified as acute or chronic: Secondary | ICD-10-CM | POA: Diagnosis not present

## 2020-05-20 DIAGNOSIS — J329 Chronic sinusitis, unspecified: Secondary | ICD-10-CM

## 2020-05-20 MED ORDER — AMOXICILLIN-POT CLAVULANATE 875-125 MG PO TABS: 1.0000 | ORAL_TABLET | Freq: Two times a day (BID) | ORAL | 0 refills | Status: AC

## 2020-05-20 NOTE — Progress Notes (Deleted)
Seen at [facility] on 05/17/2020, from their note:  Cough Sore throat  Symptoms are mild and constant. Stephanie Stone has had an exposure to [place]. Katria denies CP, SOB, abdominal pain, rash, dizziness, and N/V/D. Shanae has tried to alleviate the symptoms with antihistamine-decongestant of choice, cough suppressant of choice with minimal relief.   Given Tessalon for cough Covid test negative at that time  Originally started Friday 05/09/2020 Still having sore throat, cough, sinus pressure/runny nose, body aches Patient states sore throat a little bit better from visit with urgent care Tessalon is helping cough Has been taking Alka Seltzer Cold/Ibuprofen - helping but has been sick 1.5 weeks and symptoms come right back when medication wears off

## 2020-05-20 NOTE — Progress Notes (Signed)
Patient ID: Stephanie Stone, female   DOB: [birth year], 69 y.o.   MRN: [id number] .Marland KitchenVirtual Visit via Video Note  I connected with ZAIRA IACOVELLI on 05/20/20 at 10:50 AM EST by a video enabled telemedicine application and verified that I am speaking with the correct person using two identifiers.  Location: Patient: home Provider: clinic   I discussed the limitations of evaluation and management by telemedicine and the availability of in person appointments. The patient expressed understanding and agreed to proceed.  History of Present Illness: Pt is a 69 yo female who presents to the clinic to follow up on URI symptoms. She started having symptoms 10/29 that progressed until 05/17/2020 when she went to UC. covid testing was negative and given decongestant, cough suppressant. She started to feel a little better and then got worse. She is still having ST, cough, sinus pressure, runny nose and body aches. No loss of smell or taste. She denies any problems breathing.   .. Active Ambulatory Problems    Diagnosis Date Noted  . MENOPAUSE, PREMATURE 02/17/2007  . Migraine headache 02/17/2007  . MVP (mitral valve prolapse) 02/17/2007  . ALOPECIA AREATA 05/29/2010  . ELEVATED BLOOD PRESSURE WITHOUT DIAGNOSIS OF HYPERTENSION 07/08/2010  . Hyperlipidemia 08/11/2010  . DEPRESSION, SITUATIONAL 08/11/2010  . Slow transit constipation 10/01/2014  . Degenerative disc disease, cervical 08/07/2019  . Shooting pain 08/07/2019  . History of migraine 08/07/2019  . Occipital neuralgia of right side 08/07/2019   Resolved Ambulatory Problems    Diagnosis Date Noted  . STRESS REACTION, ACUTE 05/29/2010  . Acute cystitis 05/29/2010  . WRIST PAIN 02/17/2007  . FOOT PAIN 12/05/2008  . CHEST PAIN UNSPECIFIED 08/12/2010   Past Medical History:  Diagnosis Date  . Alopecia   . Depression   . Hypertension   . Lung infiltrate 2010  . Menopause, premature   . Migraines    Reviewed med, allergy, problem.      Observations/Objective: No acute distress Normal breathing.  Normal mood and appearance.  Congested and productive cough.   .. Today's Vitals   05/20/20 0959  Temp: 99.2 F (37.3 C)  TempSrc: Oral  Weight: 161 lb (73 kg)  Height: 5\' 2"  (1.575 m)   Body mass index is 29.45 kg/m.    Assessment and Plan: Marland KitchenMarland KitchenAshonti was seen today for cough and sore throat.  Diagnoses and all orders for this visit:  Sinobronchitis -     amoxicillin-clavulanate (AUGMENTIN) 875-125 MG tablet; Take 1 tablet by mouth 2 (two) times daily for 10 days.   Pt has been sick for over 10 days. Sent augmentin to pharmacy. Ok to add flonase. Continue with tesssalon pearles. Follow up as needed or if symptoms worsen.    Follow Up Instructions:    I discussed the assessment and treatment plan with the patient. The patient was provided an opportunity to ask questions and all were answered. The patient agreed with the plan and demonstrated an understanding of the instructions.   The patient was advised to call back or seek an in-person evaluation if the symptoms worsen or if the condition fails to improve as anticipated.     Iran Planas, PA-C

## 2020-05-27 ENCOUNTER — Other Ambulatory Visit: Payer: Medicare Other

## 2020-06-20 ENCOUNTER — Ambulatory Visit (HOSPITAL_BASED_OUTPATIENT_CLINIC_OR_DEPARTMENT_OTHER): Payer: Medicare Other

## 2020-06-20 ENCOUNTER — Encounter: Payer: Self-pay | Admitting: Family Medicine

## 2020-06-20 ENCOUNTER — Telehealth: Payer: Self-pay

## 2020-06-20 NOTE — Telephone Encounter (Signed)
Stephanie Stone called and states she was exposed to [place] on Friday. She has no symptoms. I did advise her to to call for a virtual appointment the moment she has symptoms. Also to quarantine for 10 days unless she does decide to get tested.

## 2020-06-20 NOTE — Telephone Encounter (Signed)
Agree with documentation as above.   Francile Woolford, MD  

## 2020-06-22 DIAGNOSIS — Z20822 Contact with and (suspected) exposure to covid-19: Secondary | ICD-10-CM | POA: Diagnosis not present

## 2020-06-22 DIAGNOSIS — J069 Acute upper respiratory infection, unspecified: Secondary | ICD-10-CM | POA: Diagnosis not present

## 2020-06-26 ENCOUNTER — Telehealth (INDEPENDENT_AMBULATORY_CARE_PROVIDER_SITE_OTHER): Payer: Medicare Other | Admitting: Nurse Practitioner

## 2020-06-26 ENCOUNTER — Other Ambulatory Visit: Payer: Self-pay

## 2020-06-26 ENCOUNTER — Ambulatory Visit (INDEPENDENT_AMBULATORY_CARE_PROVIDER_SITE_OTHER): Payer: Medicare Other

## 2020-06-26 ENCOUNTER — Encounter: Payer: Self-pay | Admitting: Nurse Practitioner

## 2020-06-26 VITALS — Temp 97.1°F

## 2020-06-26 DIAGNOSIS — R059 Cough, unspecified: Secondary | ICD-10-CM

## 2020-06-26 MED ORDER — BENZONATATE 200 MG PO CAPS: 200.0000 mg | ORAL_CAPSULE | Freq: Three times a day (TID) | ORAL | 0 refills | Status: DC | PRN

## 2020-06-26 MED ORDER — HYDROCOD POLST-CPM POLST ER 10-8 MG/5ML PO SUER: 5.0000 mL | Freq: Every evening | ORAL | 0 refills | Status: DC | PRN

## 2020-06-26 NOTE — Progress Notes (Signed)
Virtual Video Visit via MyChart Note  I connected with  Stephanie Stone on 06/26/20 at  9:30 AM EST by the video enabled telemedicine application for , MyChart, and verified that I am speaking with the correct person using two identifiers.   I introduced myself as a Designer, jewellery with the practice. We discussed the limitations of evaluation and management by telemedicine and the availability of in person appointments. The patient expressed understanding and agreed to proceed.  Participating parties in this visit include: The patient and the nurse practitioner listed only The patient is: at home I am: in the office  Subjective:    CC:  Chief Complaint  Patient presents with  . Cough    Persistent cough, exposed to [place] on 06/18/2020 but tested negative on 06/22/2020, "don't feel good", intermittent sore throat, glands in neck are swollen and tender    HPI: Stephanie Stone is a 69 y.o. y/o female presenting via [place] today for ongoing cough and sore throat since late October. She had a virtual visit in [place] November and was treated with Augmentin for sinobronchitis that had been present greater than 10 days. She completed the antibiotic and began to feel better, but cough was still lingering. Shortly after finishing the antibiotic she was exposed to COVID-19 (12/8). She did take a rapid COVID test (12/12) and it was negative. She has continued to have the cough, but feels it has worsened over the past few weeks and she also has a sore throat and swollen lymph nodes in her neck.    Endorses dry cough, intermittent congestion, sore throat, swollen lymph nodes in the neck, rhinorrhea, diarrhea (resolved), decreased energy, generalized feeling of unwell, and red throat.   Denies sinus pain or pressure, fevers, nausea, vomiting, loss of taste, loss of smell, or loss of appetite, shortness of breath, wheezing, difficulty breathing.   Past medical history, Surgical history, Family history not pertinant  except as noted below, Social history, Allergies, and medications have been entered into the medical record, reviewed, and corrections made.   Review of Systems:  All review of systems negative except what is listed in the HPI  Objective:    General: Speaking clearly in complete sentences without any shortness of breath. Dry consistent hacking cough present.  Mild audible congestion noted.   Alert and oriented x3.   Normal judgment.  No apparent acute distress.  Impression and Recommendations:    1. Cough Ongoing cough despite antibiotic treatment. Her symptoms do not seem COVID-like to me and she has had a recent negative COVID test so we can rule that out at this time.  It is possible that she has developed a chronic bronchitis related to her earlier infection, but would like to get a chest x-ray today to evaluate for CAP given her symptoms of fatigue and unwell.  Ongoing sore and erythematous throat likely due to viral origin or possibly PND associated with her URI symptoms.  She may benefit from a steroid burst depending on the chest x-ray results.  Will make changes to the plan of care based on results.  - DG Chest 2 View; Future - benzonatate (TESSALON) 200 MG capsule; Take 1 capsule (200 mg total) by mouth 3 (three) times daily as needed for cough.  Dispense: 30 capsule; Refill: 0 - chlorpheniramine-HYDROcodone (TUSSIONEX) 10-8 MG/5ML SUER; Take 5 mLs by mouth at bedtime as needed for cough (cough, will cause drowsiness.). May take one additional dose 12 hours after bedtime dose for symptoms  that are not controlled during the day.  Dispense: 70 mL; Refill: 0     I discussed the assessment and treatment plan with the patient. The patient was provided an opportunity to ask questions and all were answered. The patient agreed with the plan and demonstrated an understanding of the instructions.   The patient was advised to call back or seek an in-person evaluation if the symptoms  worsen or if the condition fails to improve as anticipated.  I provided 20 minutes of non-face-to-face interaction with this [place] visit including intake, same-day documentation, and chart review.   Orma Render, NP

## 2020-06-26 NOTE — Patient Instructions (Addendum)
We will plan to get the chest xray today and evaluate if we need more antibiotics or possibly a steroid burst to help reduce inflammation in the lungs. I will let you know the results and our next plan.   The number to the x-ray downstairs is [phone] you can call at and ask when a convenient time to have this done will be.   I have called in the prescription for the tessalon pearles- studies have shown these are even more effective when used with Mucinex (green box, generic is the same and less expensive).  I have also called in a prescription for Tussionex cough syrup which can help at night with the increased cough.   I will be in touch soon!

## 2020-06-27 ENCOUNTER — Encounter: Payer: Self-pay | Admitting: Nurse Practitioner

## 2020-06-27 NOTE — Progress Notes (Signed)
Chest xray does not show any signs of pneumonia, fluid in the lungs, or evidence of another acute change to account for cough, fatigue, and generalized feeling of unwell. There is evidence of scarring in the top of the lungs that has not changes since 2015.   Strongly suspect continued cough is due to post infectious bronchitis which can last for several months following upper respiratory infections. The ongoing fatigue may also be related to this and your body may just need more time to recover.   We can consider running labs to ensure that there is not anything else going on to account for the symptoms.

## 2020-07-30 ENCOUNTER — Ambulatory Visit (HOSPITAL_BASED_OUTPATIENT_CLINIC_OR_DEPARTMENT_OTHER)
Admission: RE | Admit: 2020-07-30 | Discharge: 2020-07-30 | Disposition: A | Discharge status: Home / Self Care | Payer: Medicare Other | Source: Ambulatory Visit | Attending: Family Medicine | Admitting: Family Medicine

## 2020-07-30 ENCOUNTER — Other Ambulatory Visit: Payer: Self-pay

## 2020-07-30 DIAGNOSIS — R0602 Shortness of breath: Secondary | ICD-10-CM | POA: Diagnosis not present

## 2020-07-30 LAB — ECHOCARDIOGRAM COMPLETE
Area-P 1/2: 3.02 cm2
P 1/2 time: 419 msec
S' Lateral: 2.33 cm

## 2020-08-01 ENCOUNTER — Encounter: Payer: Self-pay | Admitting: Family Medicine

## 2020-08-01 ENCOUNTER — Other Ambulatory Visit: Payer: Self-pay | Admitting: Family Medicine

## 2020-10-29 DIAGNOSIS — Z23 Encounter for immunization: Secondary | ICD-10-CM | POA: Diagnosis not present

## 2020-11-05 ENCOUNTER — Ambulatory Visit: Payer: Medicare Other | Admitting: Family Medicine

## 2020-11-11 DIAGNOSIS — E7800 Pure hypercholesterolemia, unspecified: Secondary | ICD-10-CM | POA: Diagnosis not present

## 2020-11-11 DIAGNOSIS — R0602 Shortness of breath: Secondary | ICD-10-CM | POA: Diagnosis not present

## 2020-11-11 DIAGNOSIS — R030 Elevated blood-pressure reading, without diagnosis of hypertension: Secondary | ICD-10-CM | POA: Diagnosis not present

## 2020-11-12 LAB — CBC
HCT: 47.5 % — ABNORMAL HIGH (ref 35.0–45.0)
Hemoglobin: 15.8 g/dL — ABNORMAL HIGH (ref 11.7–15.5)
MCH: 30.7 pg (ref 27.0–33.0)
MCHC: 33.3 g/dL (ref 32.0–36.0)
MCV: 92.2 fL (ref 80.0–100.0)
MPV: 12.6 fL — ABNORMAL HIGH (ref 7.5–12.5)
Platelets: 226 10*3/uL (ref 140–400)
RBC: 5.15 10*6/uL — ABNORMAL HIGH (ref 3.80–5.10)
RDW: 12.9 % (ref 11.0–15.0)
WBC: 5.9 10*3/uL (ref 3.8–10.8)

## 2020-11-12 LAB — LIPID PANEL W/REFLEX DIRECT LDL
Cholesterol: 217 mg/dL — ABNORMAL HIGH (ref <200)
HDL: 56 mg/dL (ref >=50)
LDL Cholesterol (Calc): 143 mg/dL (calc) — ABNORMAL HIGH
Non-HDL Cholesterol (Calc): 161 mg/dL (calc) — ABNORMAL HIGH (ref <130)
Total CHOL/HDL Ratio: 3.9 (calc) (ref <5.0)
Triglycerides: 77 mg/dL (ref <150)

## 2020-11-12 LAB — COMPLETE METABOLIC PANEL WITH GFR
AG Ratio: 2.1 (calc) (ref 1.0–2.5)
ALT: 18 U/L (ref 6–29)
AST: 19 U/L (ref 10–35)
Albumin: 4.5 g/dL (ref 3.6–5.1)
Alkaline phosphatase (APISO): 96 U/L (ref 37–153)
BUN/Creatinine Ratio: 30 (calc) — ABNORMAL HIGH (ref 6–22)
BUN: 26 mg/dL — ABNORMAL HIGH (ref 7–25)
CO2: 25 mmol/L (ref 20–32)
Calcium: 9.9 mg/dL (ref 8.6–10.4)
Chloride: 104 mmol/L (ref 98–110)
Creat: 0.87 mg/dL (ref 0.50–0.99)
GFR, Est African American: 79 mL/min/{1.73_m2} (ref >=60)
GFR, Est Non African American: 68 mL/min/{1.73_m2} (ref >=60)
Globulin: 2.1 g/dL (calc) (ref 1.9–3.7)
Glucose, Bld: 74 mg/dL (ref 65–99)
Potassium: 4.7 mmol/L (ref 3.5–5.3)
Sodium: 139 mmol/L (ref 135–146)
Total Bilirubin: 0.5 mg/dL (ref 0.2–1.2)
Total Protein: 6.6 g/dL (ref 6.1–8.1)

## 2020-11-12 LAB — TSH: TSH: 4.6 mIU/L — ABNORMAL HIGH (ref 0.40–4.50)

## 2020-11-13 ENCOUNTER — Ambulatory Visit (INDEPENDENT_AMBULATORY_CARE_PROVIDER_SITE_OTHER): Payer: Medicare Other | Admitting: Family Medicine

## 2020-11-13 ENCOUNTER — Other Ambulatory Visit: Payer: Self-pay

## 2020-11-13 ENCOUNTER — Encounter: Payer: Self-pay | Admitting: Family Medicine

## 2020-11-13 VITALS — BP 120/82 | HR 76 | Ht 62.0 in | Wt 141.0 lb

## 2020-11-13 DIAGNOSIS — R030 Elevated blood-pressure reading, without diagnosis of hypertension: Secondary | ICD-10-CM | POA: Diagnosis not present

## 2020-11-13 DIAGNOSIS — G43109 Migraine with aura, not intractable, without status migrainosus: Secondary | ICD-10-CM | POA: Diagnosis not present

## 2020-11-13 DIAGNOSIS — Z1211 Encounter for screening for malignant neoplasm of colon: Secondary | ICD-10-CM

## 2020-11-13 DIAGNOSIS — K5901 Slow transit constipation: Secondary | ICD-10-CM

## 2020-11-13 NOTE — Assessment & Plan Note (Signed)
Now off of Linzess.  Just using MiraLAX as needed.

## 2020-11-13 NOTE — Progress Notes (Addendum)
Established Patient Office Visit  Subjective:  Patient ID: Stephanie Stone, female    DOB: 1951-04-20  Age: 70 y.o. MRN: [id number]  CC: No chief complaint on file.   HPI Stephanie Stone presents for 6 mo f/u.   She is doing really well overall this is 77-month follow-up.  She is no longer on the fluoxetine or the Linzess.  She really made some major dramatic changes to her diet and has been exercising regularly.  She is down to 141 pounds and was previously 861 in November so she is lost about 20 pounds and just really feels much better in general.  She says her goal would be to lose another 10 to 20 pounds if possible.  Migraine headaches-she feels like overall they have been well controlled.  That she is off the Linzess she is mostly just using MiraLAX and hydrating really well.  She is due for repeat colon cancer screening she did Cologuard 3 years ago.   Past Medical History:  Diagnosis Date  . Alopecia    areata  . Depression   . Hyperlipidemia   . Hypertension   . Lung infiltrate 2010   Resolved on its own without biopsy  . Menopause, premature   . Migraines   . MVP (mitral valve prolapse)     Past Surgical History:  Procedure Laterality Date  . ABDOMINAL HYSTERECTOMY  1979   endometriosis  . APPENDECTOMY      Family History  Problem Relation Age of Onset  . Heart attack Father   . Heart attack Mother   . Hypertension Mother   . Coronary artery disease Mother 22  . Liver cancer Mother   . Heart attack Brother 3  . Stroke Brother     Social History   Socioeconomic History  . Marital status: Married    Spouse name: Sam  . Number of children: 2  . Years of education: 25  . Highest education level: 12th grade  Occupational History  . Occupation: retired    Comment: Psychologist, counselling  Tobacco Use  . Smoking status: Never Smoker  . Smokeless tobacco: Never Used  Vaping Use  . Vaping Use: Never used  Substance and Sexual Activity  . Alcohol use: Yes     Alcohol/week: 1.0 standard drink    Types: 1 Glasses of wine per week    Comment: 1/2 glass of wine a year  . Drug use: No  . Sexual activity: Not Currently  Other Topics Concern  . Not on file  Social History Narrative   No regular exercise patient states having some stress with her son. Takes Prozac and this helps. Keeps grandchildren at home during the day   Social Determinants of Health   Financial Resource Strain: Not on file  Food Insecurity: Not on file  Transportation Needs: Not on file  Physical Activity: Not on file  Stress: Not on file  Social Connections: Not on file  Intimate Partner Violence: Not on file    Outpatient Medications Prior to Visit  Medication Sig Dispense Refill  . aspirin 81 MG EC tablet Take 81 mg by mouth daily.    . Biotin 1000 MCG tablet Take 1,000 mcg by mouth daily.    . cholecalciferol (VITAMIN D3) 25 MCG (1000 UNIT) tablet Take 1,000 Units by mouth daily.    Marland Kitchen eletriptan (RELPAX) 20 MG tablet Take 1 tablet (20 mg total) by mouth as needed. may repeat in 2 hours if necessary 10 tablet  3  . fluocinonide (LIDEX) 0.05 % external solution Apply 1 application topically at bedtime.    . fluticasone (FLONASE) 50 MCG/ACT nasal spray SHAKE LIQUID AND USE 2 SPRAYS IN EACH NOSTRIL DAILY 16 g 6  . gabapentin (NEURONTIN) 100 MG capsule TAKE 1 CAPSULE(100 MG) BY MOUTH THREE TIMES DAILY 90 capsule 0  . MINOXIDIL, TOPICAL, 5 % SOLN Apply 1 application topically at bedtime. 120 mL 2  . Zinc 22.5 MG TABS Take by mouth.    . chlorpheniramine-HYDROcodone (TUSSIONEX) 10-8 MG/5ML SUER Take 5 mLs by mouth at bedtime as needed for cough (cough, will cause drowsiness.). May take one additional dose 12 hours after bedtime dose for symptoms that are not controlled during the day. 70 mL 0  . FLUoxetine (PROZAC) 20 MG tablet TAKE 1 TABLET(20 MG) BY MOUTH DAILY 90 tablet 1  . linaclotide (LINZESS) 145 MCG CAPS capsule Take 145 mcg by mouth daily before breakfast.     No  facility-administered medications prior to visit.    Allergies  Allergen Reactions  . Sulfa Antibiotics     Other reaction(s): Unknown  . Azithromycin Nausea Only  . Codeine     REACTION: NAUSEA  . Elemental Sulfur   . Lovastatin   . Miconazole Nitrate     REACTION: Nausea    ROS Review of Systems    Objective:    Physical Exam Constitutional:      Appearance: She is well-developed.  HENT:     Head: Normocephalic and atraumatic.  Cardiovascular:     Rate and Rhythm: Normal rate and regular rhythm.     Heart sounds: Normal heart sounds.  Pulmonary:     Effort: Pulmonary effort is normal.     Breath sounds: Normal breath sounds.  Skin:    General: Skin is warm and dry.  Neurological:     Mental Status: She is alert and oriented to person, place, and time.  Psychiatric:        Behavior: Behavior normal.     BP 120/82   Pulse 76   Ht 5\' 2"  (1.575 m)   Wt 141 lb (64 kg)   SpO2 98%   BMI 25.79 kg/m  Wt Readings from Last 3 Encounters:  11/13/20 141 lb (64 kg)  05/20/20 161 lb (73 kg)  05/07/20 161 lb (73 kg)     Health Maintenance Due  Topic Date Due  . Fecal DNA (Cologuard)  09/21/2020    There are no preventive care reminders to display for this patient.  Lab Results  Component Value Date   TSH 4.60 (H) 11/11/2020   Lab Results  Component Value Date   WBC 5.9 11/11/2020   HGB 15.8 (H) 11/11/2020   HCT 47.5 (H) 11/11/2020   MCV 92.2 11/11/2020   PLT 226 11/11/2020   Lab Results  Component Value Date   NA 139 11/11/2020   K 4.7 11/11/2020   CO2 25 11/11/2020   GLUCOSE 74 11/11/2020   BUN 26 (H) 11/11/2020   CREATININE 0.87 11/11/2020   BILITOT 0.5 11/11/2020   ALKPHOS 86 09/23/2016   AST 19 11/11/2020   ALT 18 11/11/2020   PROT 6.6 11/11/2020   ALBUMIN 4.1 09/23/2016   CALCIUM 9.9 11/11/2020   Lab Results  Component Value Date   CHOL 217 (H) 11/11/2020   Lab Results  Component Value Date   HDL 56 11/11/2020   Lab Results   Component Value Date   LDLCALC 143 (H) 11/11/2020   Lab  Results  Component Value Date   TRIG 77 11/11/2020   Lab Results  Component Value Date   CHOLHDL 3.9 11/11/2020   Lab Results  Component Value Date   HGBA1C 5.2 03/30/2016      Assessment & Plan:   Problem List Items Addressed This Visit      Cardiovascular and Mediastinum   Migraine headache    Okay to refill Relpax as needed.        Digestive   Slow transit constipation    Now off of Linzess.  Just using MiraLAX as needed.        Other   ELEVATED BLOOD PRESSURE WITHOUT DIAGNOSIS OF HYPERTENSION    With her weight loss and healthy lifestyle changes her blood pressure looks phenomenal today we will continue to keep an eye on it.       Other Visit Diagnoses    Encounter for screening colonoscopy    -  Primary   Relevant Orders   Cologuard     She also had labs done recently so we did go over those together. New Cologuard order sent today.  No orders of the defined types were placed in this encounter.   Follow-up: Return in about 6 months (around 05/16/2021).    Beatrice Lecher, MD

## 2020-11-13 NOTE — Assessment & Plan Note (Signed)
With her weight loss and healthy lifestyle changes her blood pressure looks phenomenal today we will continue to keep an eye on it.

## 2020-11-13 NOTE — Progress Notes (Signed)
Pt brought in her home bp cuff and  It was 123/82 p:86

## 2020-11-13 NOTE — Assessment & Plan Note (Signed)
Okay to refill Relpax as needed.

## 2020-11-27 ENCOUNTER — Telehealth: Payer: Self-pay | Admitting: Family Medicine

## 2020-11-27 NOTE — Chronic Care Management (AMB) (Signed)
  Chronic Care Management   Note  11/27/2020 Name: [name] MRN: [id number] DOB: 1950/11/13  Stephanie Stone is a 70 y.o. year old female who is a primary care patient of [name], MD. I reached out to Loreli Dollar by phone today in response to a referral sent by [name]'s PCP, [name], MD.   Ms. Mahurin was given information about Chronic Care Management services today including:  1. CCM service includes personalized support from designated clinical staff supervised by her physician, including individualized plan of care and coordination with other care providers 2. 24/7 contact phone numbers for assistance for urgent and routine care needs. 3. Service will only be billed when office clinical staff spend 20 minutes or more in a month to coordinate care. 4. Only one practitioner may furnish and bill the service in a calendar month. 5. The patient may stop CCM services at any time (effective at the end of the month) by phone call to the office staff.   Patient agreed to services and verbal consent obtained.   Follow up plan:   Lauretta Grill Upstream Scheduler

## 2020-12-03 ENCOUNTER — Other Ambulatory Visit: Payer: Self-pay | Admitting: *Deleted

## 2020-12-03 DIAGNOSIS — M5481 Occipital neuralgia: Secondary | ICD-10-CM

## 2020-12-03 DIAGNOSIS — R52 Pain, unspecified: Secondary | ICD-10-CM

## 2020-12-03 MED ORDER — GABAPENTIN 100 MG PO CAPS: ORAL_CAPSULE | ORAL | 0 refills | Status: DC

## 2020-12-15 DIAGNOSIS — Z1211 Encounter for screening for malignant neoplasm of colon: Secondary | ICD-10-CM | POA: Diagnosis not present

## 2020-12-15 LAB — COLOGUARD: Cologuard: NEGATIVE

## 2020-12-22 LAB — COLOGUARD: COLOGUARD: NEGATIVE

## 2020-12-23 ENCOUNTER — Ambulatory Visit (INDEPENDENT_AMBULATORY_CARE_PROVIDER_SITE_OTHER): Payer: Medicare Other | Admitting: Pharmacist

## 2020-12-23 DIAGNOSIS — E7800 Pure hypercholesterolemia, unspecified: Secondary | ICD-10-CM | POA: Diagnosis not present

## 2020-12-23 DIAGNOSIS — M5481 Occipital neuralgia: Secondary | ICD-10-CM

## 2020-12-23 NOTE — Progress Notes (Signed)
Chronic Care Management Pharmacy Note  12/23/2020 Name:  Stephanie Stone MRN:  [id number] DOB:  08/12/1950  Summary: addressed hyperlipidemia (at length), chronic pain.  Recommendations/Changes made from today's visit: continue lifestyle modifications for impact on cholesterol, recommend repeat lipid panel at November 2022 PCP visit, if remain elevated, initiate crestor 44m.  Plan: f/u with pharmacist in 6 months  Subjective: Stephanie RABENis an 70y.o. year old female who is a primary patient of Stephanie Stone, [name] MD.  The CCM team was consulted for assistance with disease management and care coordination needs.    Engaged with patient by telephone for initial visit in response to provider referral for pharmacy case management and/or care coordination services.   Consent to Services:  The patient was given information about Chronic Care Management services, agreed to services, and gave verbal consent prior to initiation of services.  Please see initial visit note for detailed documentation.   Patient Care Team: MHali Marry MD as PCP - General KDarius Bump [facility] Instituteas Pharmacist (Pharmacist)   Objective:  Lab Results  Component Value Date   CREATININE 0.87 11/11/2020   CREATININE 0.94 05/02/2017   CREATININE 0.94 09/23/2016       Component Value Date/Time   CHOL 217 (H) 11/11/2020 0818   TRIG 77 11/11/2020 0818   TRIG 117 12/06/2008 0000   HDL 56 11/11/2020 0818   CHOLHDL 3.9 11/11/2020 0818   VLDL 19 09/23/2016 0849   LDLCALC 143 (H) 11/11/2020 0818    Hepatic Function Latest Ref Rng & Units 11/11/2020 09/23/2016 10/07/2014  Total Protein 6.1 - 8.1 g/dL 6.6 6.2 6.9  Albumin 3.6 - 5.1 g/dL - 4.1 4.7  AST 10 - 35 U/L _0 ALT 6 - 29 U/L _1 Alk Phosphatase 33 - 130 U/L - 86 100  Total Bilirubin 0.2 - 1.2 mg/dL 0.5 0.5 0.4  Bilirubin, Direct 0.0 - 0.3 mg/dL - - -    Lab Results  Component Value Date/Time   TSH 4.60 (H) 11/11/2020 08:18 AM   TSH  4.94 04/27/2013 12:00 AM   TSH 3.804 08/06/2010 06:02 PM    CBC Latest Ref Rng & Units 11/11/2020 09/23/2016 08/07/2010  WBC 3.8 - 10.8 Thousand/uL 5.9 4.7 4.6  Hemoglobin 11.7 - 15.5 g/dL 15.8(H) 14.1 13.3  Hematocrit 35.0 - 45.0 % 47.5(H) 42.6 39.7  Platelets 140 - 400 Thousand/uL 226 234 208    Clinical ASCVD: The 10-year ASCVD risk score (Mikey BussingDC Jr., et al., 2013) is: 7.7%   Values used to calculate the score:     Age: [age]years     Sex: Female     Is Non-Hispanic African American: No     Diabetic: No     Tobacco smoker: No     Systolic Blood Pressure: 1829mmHg     Is BP treated: No     HDL Cholesterol: 56 mg/dL     Total Cholesterol: 217 mg/dL     Social History   Tobacco Use  Smoking Status Never  Smokeless Tobacco Never   BP Readings from Last 3 Encounters:  11/13/20 120/82  05/07/20 136/80  08/07/19 (!) 157/88   Pulse Readings from Last 3 Encounters:  11/13/20 76  05/07/20 75  08/07/19 67   Wt Readings from Last 3 Encounters:  11/13/20 141 lb (64 kg)  05/20/20 161 lb (73 kg)  05/07/20 161 lb (73 kg)    Assessment: Review of patient past  medical history, allergies, medications, health status, including review of consultants reports, laboratory and other test data, was performed as part of comprehensive evaluation and provision of chronic care management services.   SDOH:  (Social Determinants of Health) assessments and interventions performed:    CCM Care Plan  Allergies  Allergen Reactions   Sulfa Antibiotics     Other reaction(s): Unknown   Azithromycin Nausea Only   Codeine     REACTION: NAUSEA   Elemental Sulfur    Lovastatin    Miconazole Nitrate     REACTION: Nausea    Medications Reviewed Today     Reviewed by Darius Bump, RPH (Pharmacist) on 12/23/20 at 1108  Med List Status: <None>   Medication Order Taking? Sig Documenting Provider Last Dose Status Informant  aspirin 81 MG EC tablet [id number] Yes Take 81 mg by mouth daily.  [provider] Taking Active   Biotin 1000 MCG tablet [id number] Yes Take 1,000 mcg by mouth daily. [provider] Taking Active   cholecalciferol (VITAMIN D3) 25 MCG (1000 UNIT) tablet [id number] Yes Take 1,000 Units by mouth daily. [provider] Taking Active   eletriptan (RELPAX) 20 MG tablet [id number] Yes Take 1 tablet (20 mg total) by mouth as needed. may repeat in 2 hours if necessary [name], MD Taking Active   fluocinonide (LIDEX) 0.05 % external solution [id number] Yes Apply 1 application topically at bedtime. [provider] Taking Active   fluticasone (FLONASE) 50 MCG/ACT nasal spray [id number] Yes SHAKE LIQUID AND USE 2 SPRAYS IN EACH NOSTRIL DAILY [name], MD Taking Active   gabapentin (NEURONTIN) 100 MG capsule [id number] Yes TAKE 1 CAPSULE(100 MG) BY MOUTH THREE TIMES DAILY [name], MD Taking Active   MINOXIDIL, TOPICAL, 5 % SOLN [id number] Yes Apply 1 application topically at bedtime. [name], MD Taking Active   vitamin E 200 UNIT capsule [id number] Yes Take 200 Units by mouth daily. [provider]  Active   Zinc 22.5 MG TABS [id number] Yes Take by mouth daily. [provider] Taking Active             Patient Active Problem List   Diagnosis Date Noted   Degenerative disc disease, cervical 08/07/2019   Occipital neuralgia of right side 08/07/2019   Slow transit constipation 10/01/2014   Hyperlipidemia 08/11/2010   DEPRESSION, SITUATIONAL 08/11/2010   ELEVATED BLOOD PRESSURE WITHOUT DIAGNOSIS OF HYPERTENSION 07/08/2010   ALOPECIA AREATA 05/29/2010   MENOPAUSE, PREMATURE 02/17/2007   Migraine headache 02/17/2007   MVP (mitral valve prolapse) 02/17/2007    Immunization History  Administered Date(s) Administered   Fluad Quad(high Dose 65+) 05/07/2019, 05/07/2020   Influenza, High Dose Seasonal PF 05/02/2017   Influenza,inj,Quad PF,6+ Mos 04/20/2013, 05/27/2016,  05/17/2018   Influenza-Unspecified 04/03/2014   PFIZER Comirnaty(Gray Top)Covid-19 Tri-Sucrose Vaccine 10/29/2020   PFIZER(Purple Top)SARS-COV-2 Vaccination 08/17/2019, 09/10/2019   Pneumococcal Conjugate-13 05/02/2017   Pneumococcal Polysaccharide-23 05/17/2018   Td 07/13/2003   Tdap 12/12/2015    Conditions to be addressed/monitored: HLD and chronic pain  Care Plan : General Pharmacy (Adult)  Updates made by Darius Bump, [place] since 12/23/2020 12:00 AM     Problem: HLD, chronic pain      Long-Range Goal: Disease Progression Prevention   Note:   Current Barriers:  none  Pharmacist Clinical Goal(s):  Over the next 180 days, patient will achieve control of hyperlipidemia as evidenced by lab values through collaboration with PharmD and provider.   Interventions: 1:1  collaboration with [name], MD regarding development and update of comprehensive plan of care as evidenced by provider attestation and co-signature Inter-disciplinary care team collaboration (see longitudinal plan of care) Comprehensive medication review performed; medication list updated in electronic medical record  Hyperlipidemia:  Uncontrolled; current treatment: lifestyle modifications;   Educated on cholesterol content of various foods, will attach more information in the instructions Recommended continue current lifestyle modifications, repeat lipid panel in November, and if still elevated, initiate crestor 69m daily.    Chronic Pain  Controlled; current treatment: gabapentin 1051mTID PRN;   Recommended continue current regimen   Patient Goals/Self-Care Activities Over the next 180 days, patient will:  take medications as prescribed and engage in dietary modifications by minimizing saturated and trans fat, limiting cholesterol intake  Follow Up Plan: Telephone follow up appointment with care management team member scheduled for:  6 months     Medication Assistance: None required.   Patient affirms current coverage meets needs.  [id number] [place], [place] [address] [id number]: [phone]: [phone] pill box? No - not taking many medications, current system doing well for her Pt endorses 100% compliance  Follow Up:  Patient agrees to Care Plan and Follow-up.  Plan: Telephone follow up appointment with care management team member scheduled for:  6 months  KeDarius Bump

## 2020-12-23 NOTE — Patient Instructions (Signed)
Great chatting with you, Stephanie Stone! See below (way down) for the cholesterol/food content information. I'll touchbase with you in December!  Visit Information   PATIENT GOALS:   Goals Addressed             This Visit's Progress    Medication Management       Patient Goals/Self-Care Activities Over the next 180 days, patient will:  take medications as prescribed and engage in dietary modifications by minimizing saturated and trans fat, limiting cholesterol intake  Follow Up Plan: Telephone follow up appointment with care management team member scheduled for:  6 months         Consent to CCM Services: [name] was given information about Chronic Care Management services today including:  CCM service includes personalized support from designated clinical staff supervised by her physician, including individualized plan of care and coordination with other care providers 24/7 contact phone numbers for assistance for urgent and routine care needs. Service will only be billed when office clinical staff spend 20 minutes or more in a month to coordinate care. Only one practitioner may furnish and bill the service in a calendar month. The patient may stop CCM services at any time (effective at the end of the month) by phone call to the office staff. The patient will be responsible for cost sharing (co-pay) of up to 20% of the service fee (after annual deductible is met).  Patient agreed to services and verbal consent obtained.   Patient verbalizes understanding of instructions provided today and agrees to view in [name].   Telephone follow up appointment with care management team member scheduled for: 6 months  CLINICAL CARE PLAN: Patient Care Plan: General Pharmacy (Adult)     Problem Identified: HLD, chronic pain      Long-Range Goal: Disease Progression Prevention   Note:   Current Barriers:  none  Pharmacist Clinical Goal(s):  Over the next 180 days, patient will achieve  control of hyperlipidemia as evidenced by lab values through collaboration with PharmD and provider.   Interventions: 1:1 collaboration with [name], MD regarding development and update of comprehensive plan of care as evidenced by provider attestation and co-signature Inter-disciplinary care team collaboration (see longitudinal plan of care) Comprehensive medication review performed; medication list updated in electronic medical record  Hyperlipidemia:  Uncontrolled; current treatment: lifestyle modifications;   Educated on cholesterol content of various foods, will attach more information in the instructions Recommended continue current lifestyle modifications, repeat lipid panel in November, and if still elevated, initiate crestor 67m daily.    Chronic Pain  Controlled; current treatment: gabapentin 1018mTID PRN;   Recommended continue current regimen   Patient Goals/Self-Care Activities Over the next 180 days, patient will:  take medications as prescribed and engage in dietary modifications by minimizing saturated and trans fat, limiting cholesterol intake  Follow Up Plan: Telephone follow up appointment with care management team member scheduled for:  6 months     Cholesterol Content in Foods Cholesterol is a waxy, fat-like substance that helps to carry fat in the blood. The body needs cholesterol in small amounts, but too much cholesterol can causedamage to the arteries and heart. Most people should eat less than 200 milligrams (mg) of cholesterol a day. Foods with cholesterol  Cholesterol is found in animal-based foods, such as meat, seafood, and dairy. Generally, low-fat dairy and lean meats have less cholesterol than full-fat dairy and fatty meats. The milligrams of cholesterol per serving (mg per serving) of common cholesterol-containing  foods are listed below. Meat and other proteins Egg -- one large whole egg has 186 mg. Veal shank -- 4 oz has 141 mg. Lean  ground [place] (93% lean) -- 4 oz has 118 mg. Fat-trimmed lamb loin -- 4 oz has 106 mg. Lean ground beef (90% lean) -- 4 oz has 100 mg. Lobster -- 3.5 oz has 90 mg. Pork loin chops -- 4 oz has 86 mg. Canned salmon -- 3.5 oz has 83 mg. Fat-trimmed beef top loin -- 4 oz has 78 mg. Frankfurter -- 1 frank (3.5 oz) has 77 mg. Crab -- 3.5 oz has 71 mg. Roasted chicken without skin, white meat -- 4 oz has 66 mg. Light bologna -- 2 oz has 45 mg. Deli-cut [place] -- 2 oz has 31 mg. Canned tuna -- 3.5 oz has 31 mg. [name] -- 1 oz has 29 mg. Oysters and mussels (raw) -- 3.5 oz has 25 mg. Mackerel -- 1 oz has 22 mg. Trout -- 1 oz has 20 mg. Pork sausage -- 1 link (1 oz) has 17 mg. Salmon -- 1 oz has 16 mg. Tilapia -- 1 oz has 14 mg. Dairy Soft-serve ice cream --  cup (4 oz) has 103 mg. Whole-milk yogurt -- 1 cup (8 oz) has 29 mg. Cheddar cheese -- 1 oz has 28 mg. American cheese -- 1 oz has 28 mg. Whole milk -- 1 cup (8 oz) has 23 mg. 2% milk -- 1 cup (8 oz) has 18 mg. Cream cheese -- 1 tablespoon (Tbsp) has 15 mg. Cottage cheese --  cup (4 oz) has 14 mg. Low-fat (1%) milk -- 1 cup (8 oz) has 10 mg. Sour cream -- 1 Tbsp has 8.5 mg. Low-fat yogurt -- 1 cup (8 oz) has 8 mg. Nonfat Greek yogurt -- 1 cup (8 oz) has 7 mg. Half-and-half cream -- 1 Tbsp has 5 mg. Fats and oils Cod liver oil -- 1 tablespoon (Tbsp) has 82 mg. Butter -- 1 Tbsp has 15 mg. Lard -- 1 Tbsp has 14 mg. Bacon grease -- 1 Tbsp has 14 mg. Mayonnaise -- 1 Tbsp has 5-10 mg. Margarine -- 1 Tbsp has 3-10 mg. Exact amounts of cholesterol in these foods may vary depending on specificingredients and brands. Foods without cholesterol Most plant-based foods do not have cholesterol unless you combine them with a food that has cholesterol. Foods without cholesterol include: Grains and cereals. Vegetables. Fruits. Vegetable oils, such as olive, canola, and sunflower oil. Legumes, such as peas, beans, and lentils. Nuts and  seeds. Egg whites. Summary The body needs cholesterol in small amounts, but too much cholesterol can cause damage to the arteries and heart. Most people should eat less than 200 milligrams (mg) of cholesterol a day. This information is not intended to replace advice given to you by your health care provider. Make sure you discuss any questions you have with your healthcare provider. Document Revised: 10/09/2019 Document Reviewed: 11/19/2019 Elsevier Patient Education  [place].

## 2021-01-04 ENCOUNTER — Telehealth: Payer: Self-pay | Admitting: Family Medicine

## 2021-01-04 NOTE — Telephone Encounter (Signed)
Call pt: Your Cologuard test is negative, which is great news!  You will need to repeat colon cancer screening in 3 years.

## 2021-01-06 NOTE — Telephone Encounter (Signed)
Pt advised.

## 2021-01-07 ENCOUNTER — Other Ambulatory Visit: Payer: Self-pay | Admitting: Family Medicine

## 2021-01-07 DIAGNOSIS — M5481 Occipital neuralgia: Secondary | ICD-10-CM

## 2021-01-07 DIAGNOSIS — R52 Pain, unspecified: Secondary | ICD-10-CM

## 2021-02-05 ENCOUNTER — Other Ambulatory Visit: Payer: Self-pay

## 2021-02-05 ENCOUNTER — Ambulatory Visit (INDEPENDENT_AMBULATORY_CARE_PROVIDER_SITE_OTHER): Payer: Medicare Other | Admitting: Family Medicine

## 2021-02-05 VITALS — BP 118/83 | HR 92 | Temp 97.4°F | Wt 134.0 lb

## 2021-02-05 DIAGNOSIS — R300 Dysuria: Secondary | ICD-10-CM

## 2021-02-05 LAB — POCT URINALYSIS DIP (CLINITEK)
Blood, UA: NEGATIVE
Glucose, UA: 100 mg/dL — AB
Nitrite, UA: POSITIVE — AB
POC PROTEIN,UA: 100 — AB
Spec Grav, UA: 1.01 (ref 1.010–1.025)
Urobilinogen, UA: 2 E.U./dL — AB
pH, UA: 5 (ref 5.0–8.0)

## 2021-02-05 MED ORDER — NITROFURANTOIN MONOHYD MACRO 100 MG PO CAPS
100.0000 mg | ORAL_CAPSULE | Freq: Two times a day (BID) | ORAL | 0 refills | Status: DC
Start: 2021-02-05 — End: 2021-02-20

## 2021-02-05 NOTE — Progress Notes (Signed)
Established Patient Office Visit  Subjective:  Patient ID: Stephanie Stone, female    DOB: 02/25/1951  Age: 70 y.o. MRN: [id number]  CC:  Chief Complaint  Patient presents with   Dysuria    HPI Stephanie Stone complains of burning with urination for 1 day. She states she woke with urgency and pain. She took 1 amoxicillin she had left over. No recent catheterization. Patient has taken Azo. Denies flank pain, pelvic pain, fever, chills or sweats.     Past Medical History:  Diagnosis Date   Alopecia    areata   Depression    Hyperlipidemia    Hypertension    Lung infiltrate 2010   Resolved on its own without biopsy   Menopause, premature    Migraines    MVP (mitral valve prolapse)     Past Surgical History:  Procedure Laterality Date   ABDOMINAL HYSTERECTOMY  1979   endometriosis   APPENDECTOMY      Family History  Problem Relation Age of Onset   Heart attack Father    Heart attack Mother    Hypertension Mother    Coronary artery disease Mother 25   Liver cancer Mother    Heart attack Brother 27   Stroke Brother     Social History   Socioeconomic History   Marital status: Married    Spouse name: Sam   Number of children: 2   Years of education: 12   Highest education level: 12th grade  Occupational History   Occupation: retired    Comment: Psychologist, counselling  Tobacco Use   Smoking status: Never   Smokeless tobacco: Never  Vaping Use   Vaping Use: Never used  Substance and Sexual Activity   Alcohol use: Yes    Alcohol/week: 1.0 standard drink    Types: 1 Glasses of wine per week    Comment: 1/2 glass of wine a year   Drug use: No   Sexual activity: Not Currently  Other Topics Concern   Not on file  Social History Narrative   No regular exercise patient states having some stress with her son. Takes Prozac and this helps. Keeps grandchildren at home during the day   Social Determinants of Health   Financial Resource Strain: Not on file  Food Insecurity: Not  on file  Transportation Needs: Not on file  Physical Activity: Not on file  Stress: Not on file  Social Connections: Not on file  Intimate Partner Violence: Not on file    Outpatient Medications Prior to Visit  Medication Sig Dispense Refill   aspirin 81 MG EC tablet Take 81 mg by mouth daily.     Biotin 1000 MCG tablet Take 1,000 mcg by mouth daily.     cholecalciferol (VITAMIN D3) 25 MCG (1000 UNIT) tablet Take 1,000 Units by mouth daily.     eletriptan (RELPAX) 20 MG tablet Take 1 tablet (20 mg total) by mouth as needed. may repeat in 2 hours if necessary 10 tablet 3   fluocinonide (LIDEX) 0.05 % external solution Apply 1 application topically at bedtime.     fluticasone (FLONASE) 50 MCG/ACT nasal spray SHAKE LIQUID AND USE 2 SPRAYS IN EACH NOSTRIL DAILY 16 g 6   gabapentin (NEURONTIN) 100 MG capsule TAKE 1 CAPSULE(100 MG) BY MOUTH THREE TIMES DAILY 90 capsule 0   MINOXIDIL, TOPICAL, 5 % SOLN Apply 1 application topically at bedtime. 120 mL 2   vitamin E 200 UNIT capsule Take 200 Units by mouth  daily.     Zinc 22.5 MG TABS Take by mouth daily.     No facility-administered medications prior to visit.    Allergies  Allergen Reactions   Sulfa Antibiotics     Other reaction(s): Unknown   Azithromycin Nausea Only   Codeine     REACTION: NAUSEA   Elemental Sulfur    Lovastatin    Miconazole Nitrate     REACTION: Nausea    ROS Review of Systems    Objective:    Physical Exam  BP 118/83   Pulse 92   Temp (!) 97.4 F (36.3 C) (Oral)   Wt 134 lb (60.8 kg)   SpO2 98%   BMI 24.51 kg/m  Wt Readings from Last 3 Encounters:  02/05/21 134 lb (60.8 kg)  11/13/20 141 lb (64 kg)  05/20/20 161 lb (73 kg)     Health Maintenance Due  Topic Date Due   Zoster Vaccines- Shingrix (1 of 2) Never done   Fecal DNA (Cologuard)  09/21/2020   COVID-19 Vaccine (4 - Booster for Pfizer series) 01/28/2021    There are no preventive care reminders to display for this patient.  Lab  Results  Component Value Date   TSH 4.60 (H) 11/11/2020   Lab Results  Component Value Date   WBC 5.9 11/11/2020   HGB 15.8 (H) 11/11/2020   HCT 47.5 (H) 11/11/2020   MCV 92.2 11/11/2020   PLT 226 11/11/2020   Lab Results  Component Value Date   NA 139 11/11/2020   K 4.7 11/11/2020   CO2 25 11/11/2020   GLUCOSE 74 11/11/2020   BUN 26 (H) 11/11/2020   CREATININE 0.87 11/11/2020   BILITOT 0.5 11/11/2020   ALKPHOS 86 09/23/2016   AST 19 11/11/2020   ALT 18 11/11/2020   PROT 6.6 11/11/2020   ALBUMIN 4.1 09/23/2016   CALCIUM 9.9 11/11/2020   Lab Results  Component Value Date   CHOL 217 (H) 11/11/2020   Lab Results  Component Value Date   HDL 56 11/11/2020   Lab Results  Component Value Date   LDLCALC 143 (H) 11/11/2020   Lab Results  Component Value Date   TRIG 77 11/11/2020   Lab Results  Component Value Date   CHOLHDL 3.9 11/11/2020   Lab Results  Component Value Date   HGBA1C 5.2 03/30/2016      Assessment & Plan:  Dysuria - Per [name], Macrobid 100 mg BID for 5 days. Patient advised if symptoms persist or worsen call for urine culture.    Problem List Items Addressed This Visit   None Visit Diagnoses     Dysuria    -  Primary   Relevant Medications   nitrofurantoin, macrocrystal-monohydrate, (MACROBID) 100 MG capsule   Other Relevant Orders   POCT URINALYSIS DIP (CLINITEK) (Completed)       Meds ordered this encounter  Medications   nitrofurantoin, macrocrystal-monohydrate, (MACROBID) 100 MG capsule    Sig: Take 1 capsule (100 mg total) by mouth 2 (two) times daily.    Dispense:  10 capsule    Refill:  0     Follow-up: Return if symptoms worsen or fail to improve.    Lavell Luster, [place]

## 2021-02-05 NOTE — Progress Notes (Signed)
Agree with documentation as above.   Hristopher Missildine, MD  

## 2021-02-07 ENCOUNTER — Other Ambulatory Visit: Payer: Self-pay | Admitting: Family Medicine

## 2021-02-20 ENCOUNTER — Encounter: Payer: Self-pay | Admitting: Medical-Surgical

## 2021-02-20 ENCOUNTER — Ambulatory Visit (INDEPENDENT_AMBULATORY_CARE_PROVIDER_SITE_OTHER): Payer: Medicare Other | Admitting: Medical-Surgical

## 2021-02-20 ENCOUNTER — Other Ambulatory Visit: Payer: Self-pay

## 2021-02-20 VITALS — BP 126/76 | HR 64 | Resp 20 | Ht 62.0 in | Wt 134.0 lb

## 2021-02-20 DIAGNOSIS — J3489 Other specified disorders of nose and nasal sinuses: Secondary | ICD-10-CM

## 2021-02-20 DIAGNOSIS — J019 Acute sinusitis, unspecified: Secondary | ICD-10-CM

## 2021-02-20 MED ORDER — AMOXICILLIN-POT CLAVULANATE 875-125 MG PO TABS: 1.0000 | ORAL_TABLET | Freq: Two times a day (BID) | ORAL | 0 refills | Status: DC

## 2021-02-20 NOTE — Progress Notes (Signed)
  HPI with pertinent ROS:   CC: Sinus pressure  HPI: Very pleasant 70 year old female presenting today with reports of 2 weeks of sinus pressure/head pressure.  She has had pressure and pain in her bilateral temples, jaws, ears, and the top of her head.  Bending down causes significant facial pain.  She has had no fever, chills, shortness of breath, chest pain, sinus congestion, rhinorrhea, hearing changes, tinnitus, or GI symptoms.  She does have a bit of a chronic cough related to significant allergies.  She does take an oral antihistamine once daily.  She uses Flonase intermittently.  She has taken a COVID test which was negative.  Endorses grinding her teeth at night but does not wear a mouthguard.  I reviewed the past medical history, family history, social history, surgical history, and allergies today and no changes were needed.  Please see the problem list section below in epic for further details.   Physical exam:   General: Well Developed, well nourished, and in no acute distress.  Neuro: Alert and oriented x3.  HEENT: Normocephalic, atraumatic, pupils equal round reactive to light, neck supple, no masses, no lymphadenopathy, thyroid nonpalpable.  Right TM normal with cone of light intact.  No erythema or exudate noted in the ear canal.  Left TM dark in color with intact cone of light.  No ear canal erythema or exudate. Skin: Warm and dry. Cardiac: Regular rate and rhythm, no murmurs rubs or gallops, no lower extremity edema.  Respiratory: Clear to auscultation bilaterally. Not using accessory muscles, speaking in full sentences.  Impression and Recommendations:    1. Acute non-recurrent sinusitis, unspecified location We will go ahead and treat for acute sinusitis in the setting of significant allergic rhinitis and prolonged symptoms.  Sending in Augmentin twice daily x7 days.  Okay to use Mucinex or Sudafed as needed but monitor blood pressures for elevations.  Use Tylenol for  discomfort.  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  Recommend obtaining a mouthguard to use at night to help with teeth grinding.  Return for Left ear recheck in 1 to 2 weeks with PCP. ___________________________________________ Clearnce Sorrel, DNP, APRN, FNP-BC [facility] [place]

## 2021-02-22 NOTE — Addendum Note (Signed)
Addended bySamuel Bouche on: 02/22/2021 12:13 AM   Modules accepted: Miquel Dunn

## 2021-03-09 ENCOUNTER — Other Ambulatory Visit: Payer: Self-pay

## 2021-03-09 ENCOUNTER — Other Ambulatory Visit: Payer: Self-pay | Admitting: Family Medicine

## 2021-03-09 ENCOUNTER — Encounter: Payer: Self-pay | Admitting: Family Medicine

## 2021-03-09 ENCOUNTER — Ambulatory Visit (INDEPENDENT_AMBULATORY_CARE_PROVIDER_SITE_OTHER): Payer: Medicare Other | Admitting: Family Medicine

## 2021-03-09 VITALS — BP 140/75 | HR 88 | Ht 62.0 in | Wt 132.0 lb

## 2021-03-09 DIAGNOSIS — R519 Headache, unspecified: Secondary | ICD-10-CM | POA: Diagnosis not present

## 2021-03-09 NOTE — Progress Notes (Signed)
Established Patient Office Visit  Subjective:  Patient ID: Stephanie Stone, female    DOB: 14-Sep-1950  Age: 70 y.o. MRN: [id number]  CC:  Chief Complaint  Patient presents with   EAR CHECK    HPI FRONIA HAWTHORNE presents for seen for sinusitis on August 12.  Treated with 7 days of Augmentin.  Here today for check of left ear.  She was told that it looked a little bit red.  She said she never had ear pain she is mostly had pressure in her temples bilaterally and the top frontal part of her head she says it is worse if she bends forward or if she turns over in bed.  But it does not get worse if she just turns her head while sitting up.  She has not had any change in neck pain.  No fevers chills or sweats.  No sore throat.  Again no ear pain she says ibuprofen does help some.  Past Medical History:  Diagnosis Date   Alopecia    areata   Depression    Hyperlipidemia    Hypertension    Lung infiltrate 2010   Resolved on its own without biopsy   Menopause, premature    Migraines    MVP (mitral valve prolapse)     Past Surgical History:  Procedure Laterality Date   ABDOMINAL HYSTERECTOMY  1979   endometriosis   APPENDECTOMY      Family History  Problem Relation Age of Onset   Heart attack Father    Heart attack Mother    Hypertension Mother    Coronary artery disease Mother 57   Liver cancer Mother    Heart attack Brother 74   Stroke Brother     Social History   Socioeconomic History   Marital status: Married    Spouse name: Sam   Number of children: 2   Years of education: 12   Highest education level: 12th grade  Occupational History   Occupation: retired    Comment: Psychologist, counselling  Tobacco Use   Smoking status: Never   Smokeless tobacco: Never  Vaping Use   Vaping Use: Never used  Substance and Sexual Activity   Alcohol use: Yes    Alcohol/week: 1.0 standard drink    Types: 1 Glasses of wine per week    Comment: 1/2 glass of wine a year   Drug use: No   Sexual  activity: Not Currently  Other Topics Concern   Not on file  Social History Narrative   No regular exercise patient states having some stress with her son. Takes Prozac and this helps. Keeps grandchildren at home during the day   Social Determinants of Health   Financial Resource Strain: Not on file  Food Insecurity: Not on file  Transportation Needs: Not on file  Physical Activity: Not on file  Stress: Not on file  Social Connections: Not on file  Intimate Partner Violence: Not on file    Outpatient Medications Prior to Visit  Medication Sig Dispense Refill   aspirin 81 MG EC tablet Take 81 mg by mouth daily.     Biotin 1000 MCG tablet Take 1,000 mcg by mouth daily.     cholecalciferol (VITAMIN D3) 25 MCG (1000 UNIT) tablet Take 1,000 Units by mouth daily.     eletriptan (RELPAX) 20 MG tablet Take 1 tablet (20 mg total) by mouth as needed. may repeat in 2 hours if necessary 10 tablet 3   fluocinonide (LIDEX)  0.05 % external solution Apply 1 application topically at bedtime.     fluticasone (FLONASE) 50 MCG/ACT nasal spray SHAKE LIQUID AND USE 2 SPRAYS IN EACH NOSTRIL DAILY 16 g 6   gabapentin (NEURONTIN) 100 MG capsule TAKE 1 CAPSULE(100 MG) BY MOUTH THREE TIMES DAILY 90 capsule 0   MINOXIDIL, TOPICAL, 5 % SOLN Apply 1 application topically at bedtime. 120 mL 2   vitamin E 200 UNIT capsule Take 200 Units by mouth daily.     Zinc 22.5 MG TABS Take by mouth daily.     amoxicillin-clavulanate (AUGMENTIN) 875-125 MG tablet Take 1 tablet by mouth 2 (two) times daily. 14 tablet 0   No facility-administered medications prior to visit.    Allergies  Allergen Reactions   Sulfa Antibiotics     Other reaction(s): Unknown   Azithromycin Nausea Only   Codeine     REACTION: NAUSEA   Elemental Sulfur    Lovastatin    Miconazole Nitrate     REACTION: Nausea    ROS Review of Systems    Objective:    Physical Exam Constitutional:      Appearance: She is well-developed.  HENT:      Head: Normocephalic and atraumatic.     Right Ear: Tympanic membrane, ear canal and external ear normal.     Left Ear: Tympanic membrane, ear canal and external ear normal.     Nose: Nose normal.     Mouth/Throat:     Pharynx: No oropharyngeal exudate or posterior oropharyngeal erythema.  Eyes:     Conjunctiva/sclera: Conjunctivae normal.     Pupils: Pupils are equal, round, and reactive to light.  Neck:     Thyroid: No thyromegaly.  Cardiovascular:     Rate and Rhythm: Normal rate and regular rhythm.     Heart sounds: Normal heart sounds.  Pulmonary:     Effort: Pulmonary effort is normal.     Breath sounds: Normal breath sounds. No wheezing.  Musculoskeletal:     Cervical back: Neck supple.  Lymphadenopathy:     Cervical: No cervical adenopathy.  Skin:    General: Skin is warm and dry.  Neurological:     Mental Status: She is alert and oriented to person, place, and time.    BP 140/75   Pulse 88   Ht '5\' 2"'$  (1.575 m)   Wt 132 lb (59.9 kg)   BMI 24.14 kg/m  Wt Readings from Last 3 Encounters:  03/09/21 132 lb (59.9 kg)  02/20/21 134 lb (60.8 kg)  02/05/21 134 lb (60.8 kg)     Health Maintenance Due  Topic Date Due   Zoster Vaccines- Shingrix (1 of 2) Never done   COVID-19 Vaccine (4 - Booster for Pfizer series) 01/28/2021   INFLUENZA VACCINE  02/09/2021    There are no preventive care reminders to display for this patient.  Lab Results  Component Value Date   TSH 4.60 (H) 11/11/2020   Lab Results  Component Value Date   WBC 5.9 11/11/2020   HGB 15.8 (H) 11/11/2020   HCT 47.5 (H) 11/11/2020   MCV 92.2 11/11/2020   PLT 226 11/11/2020   Lab Results  Component Value Date   NA 139 11/11/2020   K 4.7 11/11/2020   CO2 25 11/11/2020   GLUCOSE 74 11/11/2020   BUN 26 (H) 11/11/2020   CREATININE 0.87 11/11/2020   BILITOT 0.5 11/11/2020   ALKPHOS 86 09/23/2016   AST 19 11/11/2020   ALT 18 11/11/2020  PROT 6.6 11/11/2020   ALBUMIN 4.1 09/23/2016    CALCIUM 9.9 11/11/2020   Lab Results  Component Value Date   CHOL 217 (H) 11/11/2020   Lab Results  Component Value Date   HDL 56 11/11/2020   Lab Results  Component Value Date   LDLCALC 143 (H) 11/11/2020   Lab Results  Component Value Date   TRIG 77 11/11/2020   Lab Results  Component Value Date   CHOLHDL 3.9 11/11/2020   Lab Results  Component Value Date   HGBA1C 5.2 03/30/2016      Assessment & Plan:   Problem List Items Addressed This Visit   None Visit Diagnoses     Frontal headache    -  Primary   Relevant Orders   CBC with Differential/Platelet   Sedimentation rate   C-reactive protein   MR Brain W Wo Contrast   BASIC METABOLIC PANEL WITH GFR   Bilateral headache       Relevant Orders   CBC with Differential/Platelet   Sedimentation rate   C-reactive protein   MR Brain W Wo Contrast   BASIC METABOLIC PANEL WITH GFR      Bilateral temple pain with frontal headache-has been going on for a month without a clear explanation she really does not have any other symptoms of sinusitis and did not improve on Augmentin.  In fact her symptoms are exactly the same.  She already also uses Flonase.  I think at this point moving forward with doing some labs such as a CBC sed rate and a CRP.  And going ahead and moving forward with brain MRI for further work-up is very reasonable.  She does have a history of occipital neuralgia and even migraine headaches but says this feels completely different.  No orders of the defined types were placed in this encounter.   Follow-up: No follow-ups on file.    Beatrice Lecher, MD

## 2021-03-10 LAB — CBC WITH DIFFERENTIAL/PLATELET
Absolute Monocytes: 531 cells/uL (ref 200–950)
Basophils Absolute: 38 cells/uL (ref 0–200)
Basophils Relative: 0.6 %
Eosinophils Absolute: 109 cells/uL (ref 15–500)
Eosinophils Relative: 1.7 %
HCT: 46 % — ABNORMAL HIGH (ref 35.0–45.0)
Hemoglobin: 14.9 g/dL (ref 11.7–15.5)
Lymphs Abs: 1370 cells/uL (ref 850–3900)
MCH: 30.8 pg (ref 27.0–33.0)
MCHC: 32.4 g/dL (ref 32.0–36.0)
MCV: 95.2 fL (ref 80.0–100.0)
MPV: 12 fL (ref 7.5–12.5)
Monocytes Relative: 8.3 %
Neutro Abs: 4352 cells/uL (ref 1500–7800)
Neutrophils Relative %: 68 %
Platelets: 230 10*3/uL (ref 140–400)
RBC: 4.83 10*6/uL (ref 3.80–5.10)
RDW: 12.7 % (ref 11.0–15.0)
Total Lymphocyte: 21.4 %
WBC: 6.4 10*3/uL (ref 3.8–10.8)

## 2021-03-10 LAB — BASIC METABOLIC PANEL WITH GFR
BUN: 25 mg/dL (ref 7–25)
CO2: 28 mmol/L (ref 20–32)
Calcium: 10.4 mg/dL (ref 8.6–10.4)
Chloride: 105 mmol/L (ref 98–110)
Creat: 0.91 mg/dL (ref 0.50–1.05)
Glucose, Bld: 83 mg/dL (ref 65–99)
Potassium: 5.4 mmol/L — ABNORMAL HIGH (ref 3.5–5.3)
Sodium: 140 mmol/L (ref 135–146)
eGFR: 68 mL/min/{1.73_m2} (ref >=60)

## 2021-03-10 LAB — SEDIMENTATION RATE: Sed Rate: 6 mm/h (ref 0–30)

## 2021-03-10 LAB — C-REACTIVE PROTEIN: CRP: 5.3 mg/L (ref <8.0)

## 2021-03-10 NOTE — Progress Notes (Signed)
Hi Ausha, your potassium level was a little borderline elevated.  Normally run in the 4 range.  You taking any kind of supplement etc. that might have extra potassium in it?  Lets plan to recheck it again in 2 weeks.  Your inflammatory markers are  negative.  Blood count is also negative.  This helps rule out any autoimmune issue or systemic infection which is great.  Ragona go ahead and move forward with the MRI if that is okay.

## 2021-03-23 ENCOUNTER — Ambulatory Visit (INDEPENDENT_AMBULATORY_CARE_PROVIDER_SITE_OTHER): Payer: Medicare Other

## 2021-03-23 ENCOUNTER — Other Ambulatory Visit: Payer: Self-pay

## 2021-03-23 DIAGNOSIS — R519 Headache, unspecified: Secondary | ICD-10-CM | POA: Diagnosis not present

## 2021-03-23 MED ORDER — GADOBUTROL 1 MMOL/ML IV SOLN
6.0000 mL | Freq: Once | INTRAVENOUS | Status: AC | PRN
  Administered 2021-03-23: 6 mL via INTRAVENOUS

## 2021-03-23 NOTE — Progress Notes (Signed)
Stephanie Stone, great news is there were no abnormalities on your brain MRI is a little bit of fluid in the mastoids which is connected to your ears.  But nothing specific to explain the pressure or pain.  I do not know if it could actually be originating from your neck.  Maybe when you flex forward it is causing some pressure discomfort.  Please let me know if you are still having symptoms.

## 2021-03-24 ENCOUNTER — Encounter: Payer: Self-pay | Admitting: Family Medicine

## 2021-05-19 ENCOUNTER — Ambulatory Visit (INDEPENDENT_AMBULATORY_CARE_PROVIDER_SITE_OTHER): Payer: Medicare Other | Admitting: Family Medicine

## 2021-05-19 ENCOUNTER — Other Ambulatory Visit: Payer: Self-pay

## 2021-05-19 ENCOUNTER — Encounter: Payer: Self-pay | Admitting: Family Medicine

## 2021-05-19 VITALS — BP 132/80 | HR 75 | Ht 62.0 in | Wt 134.0 lb

## 2021-05-19 DIAGNOSIS — G4484 Primary exertional headache: Secondary | ICD-10-CM

## 2021-05-19 DIAGNOSIS — G479 Sleep disorder, unspecified: Secondary | ICD-10-CM

## 2021-05-19 DIAGNOSIS — E875 Hyperkalemia: Secondary | ICD-10-CM | POA: Diagnosis not present

## 2021-05-19 DIAGNOSIS — Z23 Encounter for immunization: Secondary | ICD-10-CM | POA: Diagnosis not present

## 2021-05-19 DIAGNOSIS — R519 Headache, unspecified: Secondary | ICD-10-CM | POA: Diagnosis not present

## 2021-05-19 NOTE — Assessment & Plan Note (Signed)
We discussed maybe a retrial of the melatonin starting with 3 mg and then gradually working up if she gets to about 12 mg and its not effective then please discontinue or if has side effects.  Could also consider a trial of valerian root which is also over-the-counter to see if that is helpful.  Also just make sure that the bedroom is dark, cool, and quiet and conducive to sleep no loud noises or snoring etc.  She already avoids caffeine except for a little bit first thing in the morning.  She does have a consistent bedtime.

## 2021-05-19 NOTE — Progress Notes (Signed)
Established Patient Office Visit  Subjective:  Patient ID: Stephanie Stone, female    DOB: 1951/04/14  Age: 70 y.o. MRN: [id number]  CC:  Chief Complaint  Patient presents with   Hypertension    HPI ROISE EMERT presents for head pressure for several months.  She last saw me in August complaining of pressure in her head, that did not respond to typical treatment for sinusitis.  She is here today because she is still having pressure in her head even thought the MRI didn't show anything.  There was a little bit of fluid in the mastoid air cells but otherwise no significant abnormality she complains mostly of headache over the temple area and just above her ear occasionally notes more frontal and has a very distinct pattern.  Always seems to happen when she bends over to do something such as pick something up or just a rug.  She says that it is quite intense pain and pressure when it happens, so much so that she definitely avoids trying to bend over.  When she stands back up it can sometimes last for a minute or several minutes.  It does seem to vary.  She also notes that coughing tends to create a lot of pressure behind her eyes she is concerned that it could be some type of blood vessel issue.  She does see Dr. Ellin Goodie for occipital neuralgia of the right side of her head as well as migraine headaches.  But she feels like this is different.  She also reports sleep issues.  She tries to go to bed and get up around the same time.  She can usually fall asleep but after about 4 hours she will wake up to go to the bathroom and then cannot go back to sleep.  This is been going on for a while she really does not want to take prescription medication.  She did try some melatonin for short period of time but it did not seem to help so she stopped.  Past Medical History:  Diagnosis Date   Alopecia    areata   Depression    Hyperlipidemia    Hypertension    Lung infiltrate 2010   Resolved on its own without  biopsy   Menopause, premature    Migraines    MVP (mitral valve prolapse)     Past Surgical History:  Procedure Laterality Date   ABDOMINAL HYSTERECTOMY  1979   endometriosis   APPENDECTOMY      Family History  Problem Relation Age of Onset   Heart attack Father    Heart attack Mother    Hypertension Mother    Coronary artery disease Mother 25   Liver cancer Mother    Heart attack Brother 30   Stroke Brother     Social History   Socioeconomic History   Marital status: Married    Spouse name: Sam   Number of children: 2   Years of education: 12   Highest education level: 12th grade  Occupational History   Occupation: retired    Comment: Psychologist, counselling  Tobacco Use   Smoking status: Never   Smokeless tobacco: Never  Vaping Use   Vaping Use: Never used  Substance and Sexual Activity   Alcohol use: Yes    Alcohol/week: 1.0 standard drink    Types: 1 Glasses of wine per week    Comment: 1/2 glass of wine a year   Drug use: No   Sexual activity:  Not Currently  Other Topics Concern   Not on file  Social History Narrative   No regular exercise patient states having some stress with her son. Takes Prozac and this helps. Keeps grandchildren at home during the day   Social Determinants of Health   Financial Resource Strain: Not on file  Food Insecurity: Not on file  Transportation Needs: Not on file  Physical Activity: Not on file  Stress: Not on file  Social Connections: Not on file  Intimate Partner Violence: Not on file    Outpatient Medications Prior to Visit  Medication Sig Dispense Refill   aspirin 81 MG EC tablet Take 81 mg by mouth daily.     Biotin 1000 MCG tablet Take 1,000 mcg by mouth daily.     cholecalciferol (VITAMIN D3) 25 MCG (1000 UNIT) tablet Take 1,000 Units by mouth daily.     eletriptan (RELPAX) 20 MG tablet Take 1 tablet (20 mg total) by mouth as needed. may repeat in 2 hours if necessary 10 tablet 3   fluocinonide (LIDEX) 0.05 %  external solution Apply 1 application topically at bedtime.     fluticasone (FLONASE) 50 MCG/ACT nasal spray SHAKE LIQUID AND USE 2 SPRAYS IN EACH NOSTRIL DAILY 16 g 6   gabapentin (NEURONTIN) 100 MG capsule TAKE 1 CAPSULE(100 MG) BY MOUTH THREE TIMES DAILY 90 capsule 0   MINOXIDIL, TOPICAL, 5 % SOLN Apply 1 application topically at bedtime. 120 mL 2   vitamin E 200 UNIT capsule Take 200 Units by mouth daily.     Zinc 22.5 MG TABS Take by mouth daily.     No facility-administered medications prior to visit.    Allergies  Allergen Reactions   Sulfa Antibiotics     Other reaction(s): Unknown   Azithromycin Nausea Only   Codeine     REACTION: NAUSEA   Elemental Sulfur    Lovastatin    Miconazole Nitrate     REACTION: Nausea    ROS Review of Systems    Objective:    Physical Exam  BP 132/80   Pulse 75   Ht 5' 2"  (1.575 m)   Wt 134 lb (60.8 kg)   SpO2 100%   BMI 24.51 kg/m  Wt Readings from Last 3 Encounters:  05/19/21 134 lb (60.8 kg)  03/09/21 132 lb (59.9 kg)  02/20/21 134 lb (60.8 kg)     Health Maintenance Due  Topic Date Due   Zoster Vaccines- Shingrix (1 of 2) Never done   COVID-19 Vaccine (4 - Booster for Pfizer series) 12/24/2020    There are no preventive care reminders to display for this patient.  Lab Results  Component Value Date   TSH 4.60 (H) 11/11/2020   Lab Results  Component Value Date   WBC 6.4 03/09/2021   HGB 14.9 03/09/2021   HCT 46.0 (H) 03/09/2021   MCV 95.2 03/09/2021   PLT 230 03/09/2021   Lab Results  Component Value Date   NA 140 03/09/2021   K 5.4 (H) 03/09/2021   CO2 28 03/09/2021   GLUCOSE 83 03/09/2021   BUN 25 03/09/2021   CREATININE 0.91 03/09/2021   BILITOT 0.5 11/11/2020   ALKPHOS 86 09/23/2016   AST 19 11/11/2020   ALT 18 11/11/2020   PROT 6.6 11/11/2020   ALBUMIN 4.1 09/23/2016   CALCIUM 10.4 03/09/2021   EGFR 68 03/09/2021   Lab Results  Component Value Date   CHOL 217 (H) 11/11/2020   Lab Results   Component  Value Date   HDL 56 11/11/2020   Lab Results  Component Value Date   LDLCALC 143 (H) 11/11/2020   Lab Results  Component Value Date   TRIG 77 11/11/2020   Lab Results  Component Value Date   CHOLHDL 3.9 11/11/2020   Lab Results  Component Value Date   HGBA1C 5.2 03/30/2016      Assessment & Plan:   Problem List Items Addressed This Visit       Other   Sleep disturbance    We discussed maybe a retrial of the melatonin starting with 3 mg and then gradually working up if she gets to about 12 mg and its not effective then please discontinue or if has side effects.  Could also consider a trial of valerian root which is also over-the-counter to see if that is helpful.  Also just make sure that the bedroom is dark, cool, and quiet and conducive to sleep no loud noises or snoring etc.  She already avoids caffeine except for a little bit first thing in the morning.  She does have a consistent bedtime.      Other Visit Diagnoses     Need for immunization against influenza    -  Primary   Relevant Orders   Flu Vaccine QUAD High Dose(Fluad) (Completed)   Serum potassium elevated       Relevant Orders   BASIC METABOLIC PANEL WITH GFR   Bilateral headache       Relevant Orders   MR Angiogram Head Wo Contrast   US Carotid Duplex Bilateral   Pressure in head       Relevant Orders   Ambulatory referral to ENT   MR Angiogram Head Wo Contrast   US Carotid Duplex Bilateral   Exertional headache           Bilateral headaches that seem to be position triggered and resolved fairly quickly so unlike her migraines.  We discussed potential causes.  Sometimes her blood pressure is elevated but repeat here today looked okay.  We also discussed that it could be coming from the occipital area of her neck she has known issues with occipital neuralgia on the right side so maybe this is somewhat similar and is triggered by position change.  She still wonders if it could be an ENT  sinus pressure issue so the plan will be to refer her to ENT for further evaluation did encourage her to get a copy of the MRI to take with her.  Also discussed possible blood vessel/aneurysm issue.  I think it would warrant MR angiogram for further work-up I think that that is perfectly reasonable and may be even a carotid Doppler to evaluate for peripheral vascular disease.  Also discussed may [place] following up with her neurologist at some point if we cannot quite figure out what is going on.  No orders of the defined types were placed in this encounter.   Follow-up: No follow-ups on file.    Beatrice Lecher, MD

## 2021-05-20 LAB — BASIC METABOLIC PANEL WITH GFR
BUN: 18 mg/dL (ref 7–25)
CO2: 23 mmol/L (ref 20–32)
Calcium: 10.5 mg/dL — ABNORMAL HIGH (ref 8.6–10.4)
Chloride: 108 mmol/L (ref 98–110)
Creat: 0.81 mg/dL (ref 0.60–1.00)
Glucose, Bld: 90 mg/dL (ref 65–99)
Potassium: 5.1 mmol/L (ref 3.5–5.3)
Sodium: 144 mmol/L (ref 135–146)
eGFR: 78 mL/min/{1.73_m2} (ref >=60)

## 2021-05-20 NOTE — Progress Notes (Signed)
Your lab work is within acceptable range and there are no concerning findings.   ?

## 2021-05-22 ENCOUNTER — Ambulatory Visit (INDEPENDENT_AMBULATORY_CARE_PROVIDER_SITE_OTHER): Payer: Medicare Other | Admitting: Family Medicine

## 2021-05-22 DIAGNOSIS — Z1231 Encounter for screening mammogram for malignant neoplasm of breast: Secondary | ICD-10-CM | POA: Diagnosis not present

## 2021-05-22 DIAGNOSIS — Z0000 Encounter for general adult medical examination without abnormal findings: Secondary | ICD-10-CM | POA: Diagnosis not present

## 2021-05-22 DIAGNOSIS — Z780 Asymptomatic menopausal state: Secondary | ICD-10-CM

## 2021-05-22 NOTE — Patient Instructions (Addendum)
  [facility] Maintenance Summary and Written Plan of Care  [name] ,  Thank you for allowing me to perform your Medicare Annual Wellness Visit and for your ongoing commitment to your health.   Health Maintenance & Immunization History Health Maintenance  Topic Date Due   COVID-19 Vaccine (4 - Booster for Pfizer series) 06/07/2021 (Originally 12/24/2020)   Zoster Vaccines- Shingrix (1 of 2) 08/22/2021 (Originally 04/27/1970)   MAMMOGRAM  08/15/2021   Fecal DNA (Cologuard)  12/16/2023   TETANUS/TDAP  12/11/2025   Pneumonia Vaccine 14+ Years old  Completed   INFLUENZA VACCINE  Completed   DEXA SCAN  Completed   Hepatitis C Screening  Completed   HPV VACCINES  Aged Out   Immunization History  Administered Date(s) Administered   Fluad Quad(high Dose 65+) 05/07/2019, 05/07/2020, 05/19/2021   Influenza, High Dose Seasonal PF 05/02/2017   Influenza,inj,Quad PF,6+ Mos 04/20/2013, 05/27/2016, 05/17/2018   Influenza-Unspecified 04/03/2014   PFIZER Comirnaty(Gray Top)Covid-19 Tri-Sucrose Vaccine 10/29/2020   PFIZER(Purple Top)SARS-COV-2 Vaccination 08/17/2019, 09/10/2019   Pneumococcal Conjugate-13 05/02/2017   Pneumococcal Polysaccharide-23 05/17/2018   Td 07/13/2003   Tdap 12/12/2015    These are the patient goals that we discussed:  Goals Addressed               This Visit's Progress     Patient Stated (pt-stated)        She would like to maintain her weight loss.         This is a list of Health Maintenance Items that are overdue or due now: Screening mammography Bone densitometry screening Shingrix  Orders/Referrals Placed Today: Orders Placed This Encounter  Procedures   Mammogram 3D SCREEN BREAST BILATERAL    Standing Status:   Future    Standing Expiration Date:   05/22/2022    Scheduling Instructions:     Please call patient to schedule.    Order Specific Question:   Reason for Exam (SYMPTOM  OR DIAGNOSIS REQUIRED)    Answer:    Breast cancer screening    Order Specific Question:   Preferred imaging location?    Answer:   [facility]    Standing Status:   Future    Standing Expiration Date:   05/22/2022    Scheduling Instructions:     Please call patient to schedule.    Order Specific Question:   Reason for exam:    Answer:   Post menopausal    Order Specific Question:   Preferred imaging location?    Answer:   [facility]   (Contact our referral department at [phone] if you have not spoken with someone about your referral appointment within the next 5 days)    Follow-up Plan Follow-up with [name], MD as planned Schedule your Shingrix vaccine at the pharmacy.  Referral for your bone density and mammogram have been sent and they will call to schedule.  Medicare wellness visit in one year.  Patient will access AVS on mychart.

## 2021-05-22 NOTE — Progress Notes (Signed)
MEDICARE ANNUAL WELLNESS VISIT  05/22/2021  Telephone Visit Disclaimer This Medicare AWV was conducted by telephone due to national recommendations for restrictions regarding the COVID-19 Pandemic (e.g. social distancing).  I verified, using two identifiers, that I am speaking with Stephanie Stone or their authorized healthcare agent. I discussed the limitations, risks, security, and privacy concerns of performing an evaluation and management service by telephone and the potential availability of an in-person appointment in the future. The patient expressed understanding and agreed to proceed.  Location of Patient: Home Location of Provider (nurse):  Provider home  Subjective:    Stephanie Stone is a 70 y.o. female patient of [name], Rene Kocher, MD who had a Medicare Annual Wellness Visit today via telephone. Dulse is Retired and lives with their spouse. she has 2 children. she reports that she is socially active and does interact with friends/family regularly. she is minimally physically active and enjoys painting and sewing.  Patient Care Team: [name], MD as PCP - General Darius Bump, [facility] as Pharmacist (Pharmacist)  Advanced Directives 05/22/2021 07/31/2019 05/17/2018 05/02/2017  Does Patient Have a Medical Advance Directive? No No No No  Would patient like information on creating a medical advance directive? No - Patient declined No - Patient declined Yes (MAU/Ambulatory/Procedural Areas - Information given) Yes (MAU/Ambulatory/Procedural Areas - Information given)    Hospital Utilization Over the Past 12 Months: # of hospitalizations or ER visits: 0 # of surgeries: 0  Review of Systems    Patient reports that her overall health is unchanged compared to last year.  History obtained from chart review and the patient  Patient Reported Readings (BP, Pulse, CBG, Weight, etc) none  Pain Assessment Pain : No/denies pain     Current Medications & Allergies  (verified) Allergies as of 05/22/2021       Reactions   Sulfa Antibiotics    Other reaction(s): Unknown   Azithromycin Nausea Only   Codeine    REACTION: NAUSEA   Elemental Sulfur    Lovastatin    Miconazole Nitrate    REACTION: Nausea        Medication List        Accurate as of May 22, 2021 11:28 AM. If you have any questions, ask your nurse or doctor.          aspirin 81 MG EC tablet Take 81 mg by mouth daily.   Biotin 1000 MCG tablet Take 1,000 mcg by mouth daily.   cholecalciferol 25 MCG (1000 UNIT) tablet Commonly known as: VITAMIN D3 Take 1,000 Units by mouth daily.   eletriptan 20 MG tablet Commonly known as: RELPAX Take 1 tablet (20 mg total) by mouth as needed. may repeat in 2 hours if necessary   fluocinonide 0.05 % external solution Commonly known as: LIDEX Apply 1 application topically at bedtime.   fluticasone 50 MCG/ACT nasal spray Commonly known as: FLONASE SHAKE LIQUID AND USE 2 SPRAYS IN EACH NOSTRIL DAILY   gabapentin 100 MG capsule Commonly known as: NEURONTIN TAKE 1 CAPSULE(100 MG) BY MOUTH THREE TIMES DAILY   MINOXIDIL (TOPICAL) 5 % Soln Apply 1 application topically at bedtime.   vitamin E 200 UNIT capsule Take 200 Units by mouth daily.   Zinc 22.5 MG Tabs Take by mouth daily.        History (reviewed): Past Medical History:  Diagnosis Date   Allergy    Alopecia    areata   Anxiety    situational  Depression    Hyperlipidemia    Hypertension    Lung infiltrate 07/12/2008   Resolved on its own without biopsy   Menopause, premature    Migraines    MVP (mitral valve prolapse)    Past Surgical History:  Procedure Laterality Date   ABDOMINAL HYSTERECTOMY  07/12/1977   endometriosis   APPENDECTOMY     Family History  Problem Relation Age of Onset   Heart attack Father    Early death Father    Heart disease Father    Heart attack Mother    Hypertension Mother    Coronary artery disease Mother 57    Liver cancer Mother    Cancer Mother    Heart disease Mother    Heart attack Brother 105   Stroke Brother    Heart disease Brother    Social History   Socioeconomic History   Marital status: Married    Spouse name: Sam   Number of children: 2   Years of education: 12   Highest education level: 12th grade  Occupational History   Occupation: retired    Comment: Psychologist, counselling  Tobacco Use   Smoking status: Never   Smokeless tobacco: Never  Vaping Use   Vaping Use: Never used  Substance and Sexual Activity   Alcohol use: Yes    Alcohol/week: 1.0 standard drink    Types: 1 Glasses of wine per week    Comment: maybe once a month   Drug use: No   Sexual activity: Not Currently  Other Topics Concern   Not on file  Social History Narrative   Lives with her husband. She has two children. She enjoys painting and sewing.   Social Determinants of Health   Financial Resource Strain: Low Risk    Difficulty of Paying Living Expenses: Not hard at all  Food Insecurity: No Food Insecurity   Worried About Charity fundraiser in the Last Year: Never true   Purple [place]age in the Last Year: Never true  Transportation Needs: No Transportation Needs   Lack of Transportation (Medical): No   Lack of Transportation (Non-Medical): No  Physical Activity: Insufficiently Active   Days of Exercise per Week: 2 days   Minutes of Exercise per Session: 30 min  Stress: No Stress Concern Present   Feeling of Stress : Not at all  Social Connections: Socially Integrated   Frequency of Communication with Friends and Family: More than three times a week   Frequency of Social Gatherings with Friends and Family: More than three times a week   Attends Religious Services: More than 4 times per year   Active Member of [employer] or Organizations: Yes   Attends Archivist Meetings: More than 4 times per year   Marital Status: Married    Activities of Daily Living In your present state of health, do  you have any difficulty performing the following activities: 05/22/2021 05/22/2021  Hearing? N N  Vision? N N  Difficulty concentrating or making decisions? N N  Walking or climbing stairs? N N  Dressing or bathing? N N  Doing errands, shopping? N N  Preparing Food and eating ? N N  Using the Toilet? N N  In the past six months, have you accidently leaked urine? N N  Do you have problems with loss of bowel control? N N  Managing your Medications? N N  Managing your Finances? N N  Housekeeping or managing your Housekeeping? N N  Some  recent data might be hidden    Patient Education/ Literacy How often do you need to have someone help you when you read instructions, pamphlets, or other written materials from your doctor or pharmacy?: 1 - Never What is the last grade level you completed in school?: 12th grade  Exercise Current Exercise Habits: Home exercise routine, Type of exercise: treadmill, Time (Minutes): 30, Frequency (Times/Week): 2, Weekly Exercise (Minutes/Week): 60, Intensity: Moderate, Exercise limited by: None identified  Diet Patient reports consuming 1 meals a day and 4 snack(s) a day Patient reports that her primary diet is: Regular Patient reports that she does have regular access to food.   Depression Screen PHQ 2/9 Scores 05/22/2021 03/09/2021 11/13/2020 07/31/2019 05/07/2019 08/15/2018 05/17/2018  PHQ - 2 Score 0 0 0 0 0 0 0  PHQ- 9 Score - - - - - - -     Fall Risk Fall Risk  05/22/2021 05/22/2021 05/19/2021 03/09/2021 11/13/2020  Falls in the past year? 0 0 0 0 0  Number falls in past yr: 0 0 0 - 0  Injury with Fall? 0 0 0 - 0  Risk for fall due to : No Fall Risks - No Fall Risks - No Fall Risks  Follow up Falls evaluation completed - Falls prevention discussed;Falls evaluation completed Falls evaluation completed Falls evaluation completed;Falls prevention discussed     Objective:  DRU LAUREL seemed alert and oriented and she participated appropriately during our  telephone visit.  Blood Pressure Weight BMI  BP Readings from Last 3 Encounters:  05/19/21 132/80  03/09/21 140/75  02/20/21 126/76   Wt Readings from Last 3 Encounters:  05/19/21 134 lb (60.8 kg)  03/09/21 132 lb (59.9 kg)  02/20/21 134 lb (60.8 kg)   BMI Readings from Last 1 Encounters:  05/19/21 24.51 kg/m    *Unable to obtain current vital signs, weight, and BMI due to telephone visit type  Hearing/Vision  Karsen did not seem to have difficulty with hearing/understanding during the telephone conversation Reports that she has had a formal eye exam by an eye care professional within the past year Reports that she has not had a formal hearing evaluation within the past year *Unable to fully assess hearing and vision during telephone visit type  Cognitive Function: 6CIT Screen 05/22/2021 07/31/2019 05/17/2018 05/02/2017 05/27/2016  What Year? 0 points 0 points 0 points 0 points 0 points  What month? 0 points 0 points 0 points 0 points 0 points  What time? 0 points 0 points 0 points 0 points 0 points  Count back from 20 0 points 0 points 0 points 0 points 0 points  Months in reverse 0 points 0 points 0 points 0 points 0 points  Repeat phrase 0 points 0 points 2 points 2 points 4 points  Total Score 0 0 2 2 4    (Normal:0-7, Significant for Dysfunction: >8)  Normal Cognitive Function Screening: Yes   Immunization & Health Maintenance Record Immunization History  Administered Date(s) Administered   Fluad Quad(high Dose 65+) 05/07/2019, 05/07/2020, 05/19/2021   Influenza, High Dose Seasonal PF 05/02/2017   Influenza,inj,Quad PF,6+ Mos 04/20/2013, 05/27/2016, 05/17/2018   Influenza-Unspecified 04/03/2014   PFIZER Comirnaty(Gray Top)Covid-19 Tri-Sucrose Vaccine 10/29/2020   PFIZER(Purple Top)SARS-COV-2 Vaccination 08/17/2019, 09/10/2019   Pneumococcal Conjugate-13 05/02/2017   Pneumococcal Polysaccharide-23 05/17/2018   Td 07/13/2003   Tdap 12/12/2015    Health Maintenance   Topic Date Due   COVID-19 Vaccine (4 - Booster for [place] series) 06/07/2021 (Originally 12/24/2020)  Zoster Vaccines- Shingrix (1 of 2) 08/22/2021 (Originally 04/27/1970)   MAMMOGRAM  08/15/2021   Fecal DNA (Cologuard)  12/16/2023   TETANUS/TDAP  12/11/2025   Pneumonia Vaccine 50+ Years old  Completed   INFLUENZA VACCINE  Completed   DEXA SCAN  Completed   Hepatitis C Screening  Completed   HPV VACCINES  Aged Out       Assessment  This is a routine wellness examination for ADAMARIZ GILLOTT.  Health Maintenance: Due or Overdue There are no preventive care reminders to display for this patient.   Stephanie Stone does not need a referral for Community Assistance: Care Management:   no Social Work:    no Prescription Assistance:  no Nutrition/Diabetes Education:  no   Plan:  Personalized Goals  Goals Addressed               This Visit's Progress     Patient Stated (pt-stated)        She would like to maintain her weight loss.       Personalized Health Maintenance & Screening Recommendations  Screening mammography Bone densitometry screening Shingrix  Lung Cancer Screening Recommended: no (Low Dose CT Chest recommended if Age 21-80 years, 30 pack-year currently smoking OR have quit w/in past 15 years) Hepatitis C Screening recommended: no HIV Screening recommended: no  Advanced Directives: Written information was not prepared per patient's request.  Referrals & Orders Orders Placed This Encounter  Procedures   Mammogram 3D [place]     Follow-up Plan Follow-up with [name], MD as planned Schedule your Shingrix vaccine at the pharmacy.  Referral for your bone density and mammogram have been sent and they will call to schedule.  Medicare wellness visit in one year.  Patient will access AVS on mychart.   I have personally reviewed and noted the following in the patient's chart:   Medical and social history Use of alcohol,  tobacco or illicit drugs  Current medications and supplements Functional ability and status Nutritional status Physical activity Advanced directives List of other physicians Hospitalizations, surgeries, and ER visits in previous 12 months Vitals Screenings to include cognitive, depression, and falls Referrals and appointments  In addition, I have reviewed and discussed with Stephanie Stone certain preventive protocols, quality metrics, and best practice recommendations. A written personalized care plan for preventive services as well as general preventive health recommendations is available and can be mailed to the patient at her request.      Tinnie Gens, RN  05/22/2021

## 2021-05-23 ENCOUNTER — Ambulatory Visit (HOSPITAL_BASED_OUTPATIENT_CLINIC_OR_DEPARTMENT_OTHER): Payer: Medicare Other

## 2021-05-25 ENCOUNTER — Other Ambulatory Visit: Payer: Self-pay | Admitting: *Deleted

## 2021-05-25 DIAGNOSIS — R52 Pain, unspecified: Secondary | ICD-10-CM

## 2021-05-25 DIAGNOSIS — M5481 Occipital neuralgia: Secondary | ICD-10-CM

## 2021-05-25 MED ORDER — GABAPENTIN 100 MG PO CAPS: 100.0000 mg | ORAL_CAPSULE | Freq: Three times a day (TID) | ORAL | 3 refills | Status: DC

## 2021-05-30 ENCOUNTER — Ambulatory Visit (HOSPITAL_BASED_OUTPATIENT_CLINIC_OR_DEPARTMENT_OTHER)
Admission: RE | Admit: 2021-05-30 | Discharge: 2021-05-30 | Disposition: A | Discharge status: Home / Self Care | Payer: Medicare Other | Source: Ambulatory Visit | Attending: Family Medicine | Admitting: Family Medicine

## 2021-05-30 ENCOUNTER — Other Ambulatory Visit: Payer: Self-pay

## 2021-05-30 DIAGNOSIS — R519 Headache, unspecified: Secondary | ICD-10-CM | POA: Diagnosis not present

## 2021-05-30 DIAGNOSIS — E785 Hyperlipidemia, unspecified: Secondary | ICD-10-CM | POA: Diagnosis not present

## 2021-05-30 DIAGNOSIS — I6523 Occlusion and stenosis of bilateral carotid arteries: Secondary | ICD-10-CM | POA: Diagnosis not present

## 2021-06-01 ENCOUNTER — Encounter: Payer: Self-pay | Admitting: Family Medicine

## 2021-06-01 NOTE — Progress Notes (Signed)
Hi Stephanie Stone, carotid Dopplers show mild atherosclerosis on both sides.  Not significant enough that it requires any type of intervention such as a procedure.  But I would recommend continue to work on keeping blood pressure well controlled and reducing plaque formation by considering taking a statin.  I know you have tried lovastatin in the past but there are other options if she is open to taking something.

## 2021-06-02 ENCOUNTER — Encounter: Payer: Self-pay | Admitting: Family Medicine

## 2021-06-02 DIAGNOSIS — M5481 Occipital neuralgia: Secondary | ICD-10-CM

## 2021-06-02 DIAGNOSIS — R519 Headache, unspecified: Secondary | ICD-10-CM

## 2021-06-02 MED ORDER — ROSUVASTATIN CALCIUM 5 MG PO TABS: 5.0000 mg | ORAL_TABLET | Freq: Every evening | ORAL | 3 refills | Status: DC | PRN

## 2021-06-02 NOTE — Telephone Encounter (Signed)
Meds ordered this encounter  Medications   rosuvastatin (CRESTOR) 5 MG tablet    Sig: Take 1 tablet (5 mg total) by mouth at bedtime as needed.    Dispense:  90 tablet    Refill:  3   I think nutrition consult would be great but  I would encourage here to check on insurance coverage on this.  They may not cover it without a dx of diabetes.  Plan to recheck cholesterol in 3 mo on new med

## 2021-06-02 NOTE — Progress Notes (Signed)
Hi Stephanie Stone, good news overall and that the imaging of the blood vessels in the brain looks normal no sign of narrowing or stenosis and no sign of aneurysm.  No sign of blood vessel blockage.  So unfortunately I still do not have a great reason for your headaches.  I would like to refer you to neurology for further evaluation if that is okay.

## 2021-06-03 NOTE — Telephone Encounter (Signed)
Orders Placed This Encounter  Procedures   Ambulatory referral to Neurology    Referral Priority:   Routine    Referral Type:   Consultation    Referral Reason:   Specialty Services Required    Requested Specialty:   Neurology    Number of Visits Requested:   1    

## 2021-06-09 ENCOUNTER — Encounter: Payer: Self-pay | Admitting: Emergency Medicine

## 2021-06-09 ENCOUNTER — Emergency Department (INDEPENDENT_AMBULATORY_CARE_PROVIDER_SITE_OTHER)
Admission: EM | Admit: 2021-06-09 | Discharge: 2021-06-09 | Disposition: A | Discharge status: Home / Self Care | Payer: Medicare Other | Source: Home / Self Care

## 2021-06-09 DIAGNOSIS — N3001 Acute cystitis with hematuria: Secondary | ICD-10-CM | POA: Diagnosis not present

## 2021-06-09 LAB — POCT URINALYSIS DIP (MANUAL ENTRY)
Glucose, UA: 100 mg/dL — AB
Ketones, POC UA: NEGATIVE mg/dL
Leukocytes, UA: NEGATIVE
Nitrite, UA: POSITIVE — AB
Protein Ur, POC: 100 mg/dL — AB
Spec Grav, UA: >=1.03 — AB (ref 1.010–1.025)
Urobilinogen, UA: 2 E.U./dL — AB
pH, UA: 5 (ref 5.0–8.0)

## 2021-06-09 MED ORDER — NITROFURANTOIN MONOHYD MACRO 100 MG PO CAPS: 100.0000 mg | ORAL_CAPSULE | Freq: Two times a day (BID) | ORAL | 0 refills | Status: AC

## 2021-06-09 NOTE — ED Triage Notes (Signed)
Frequency last night w/ dysuria AZO  at 2100 & 0400 Denies back pain  Ibuprofen  at 1130

## 2021-06-09 NOTE — Discharge Instructions (Addendum)
Advised patient to take medication as directed with food to completion.  Encouraged patient increase daily water intake while taking this medication.  Advised patient we will follow-up with urine culture results once received.

## 2021-06-09 NOTE — ED Provider Notes (Signed)
Stephanie Stone [facility]    CSN: [id number] Arrival date & time: 06/09/21  1208      History   Chief Complaint Chief Complaint  Patient presents with   Urinary Frequency   Dysuria    HPI Stephanie Stone is a 70 y.o. female.   HPI Pleasant 70 year old female presents with frequency last night and some dysuria this morning.  Patient denies flank pain.  Patient reports taking Azo at 9 PM yesterday and 4 AM this morning.  Past Medical History:  Diagnosis Date   Allergy    Alopecia    areata   Anxiety    situational   Depression    Hyperlipidemia    Hypertension    Lung infiltrate 07/12/2008   Resolved on its own without biopsy   Menopause, premature    Migraines    MVP (mitral valve prolapse)     Patient Active Problem List   Diagnosis Date Noted   Sleep disturbance 05/19/2021   Degenerative disc disease, cervical 08/07/2019   Occipital neuralgia of right side 08/07/2019   Slow transit constipation 10/01/2014   Hyperlipidemia 08/11/2010   DEPRESSION, SITUATIONAL 08/11/2010   ELEVATED BLOOD PRESSURE WITHOUT DIAGNOSIS OF HYPERTENSION 07/08/2010   ALOPECIA AREATA 05/29/2010   MENOPAUSE, PREMATURE 02/17/2007   Migraine headache 02/17/2007   MVP (mitral valve prolapse) 02/17/2007    Past Surgical History:  Procedure Laterality Date   ABDOMINAL HYSTERECTOMY  07/12/1977   endometriosis   APPENDECTOMY      OB History   No obstetric history on file.      Home Medications    Prior to Admission medications   Medication Sig Start Date End Date Taking? Authorizing Provider  nitrofurantoin, macrocrystal-monohydrate, (MACROBID) 100 MG capsule Take 1 capsule (100 mg total) by mouth 2 (two) times daily for 7 days. 06/09/21 06/16/21 Yes Eliezer Lofts, FNP  aspirin 81 MG EC tablet Take 81 mg by mouth daily.    [provider]  Biotin 1000 MCG tablet Take 1,000 mcg by mouth daily.    [provider]  cholecalciferol (VITAMIN D3) 25 MCG (1000 UNIT) tablet  Take 1,000 Units by mouth daily.    [provider]  eletriptan (RELPAX) 20 MG tablet Take 1 tablet (20 mg total) by mouth as needed. may repeat in 2 hours if necessary 07/31/19   [name], MD  fluocinonide (LIDEX) 0.05 % external solution Apply 1 application topically at bedtime.    [provider]  fluticasone (FLONASE) 50 MCG/ACT nasal spray SHAKE LIQUID AND USE 2 SPRAYS IN EACH NOSTRIL DAILY 02/17/21   [name], MD  gabapentin (NEURONTIN) 100 MG capsule Take 1 capsule (100 mg total) by mouth 3 (three) times daily. 05/25/21   [name], MD  MINOXIDIL, TOPICAL, 5 % SOLN Apply 1 application topically at bedtime. 05/07/19   [name], MD  rosuvastatin (CRESTOR) 5 MG tablet Take 1 tablet (5 mg total) by mouth at bedtime as needed. 06/02/21   [name], MD  vitamin E 200 UNIT capsule Take 200 Units by mouth daily.    [provider]  Zinc 22.5 MG TABS Take by mouth daily.    [provider]    Family History Family History  Problem Relation Age of Onset   Heart attack Mother    Hypertension Mother    Coronary artery disease Mother 80   Liver cancer Mother    Cancer Mother    Heart disease Mother  Heart attack Father    Early death Father    Heart disease Father    Heart attack Brother 26   Stroke Brother    Heart disease Brother     Social History Social History   Tobacco Use   Smoking status: Never   Smokeless tobacco: Never  Vaping Use   Vaping Use: Never used  Substance Use Topics   Alcohol use: Yes    Alcohol/week: 1.0 standard drink    Types: 1 Glasses of wine per week    Comment: maybe once a month   Drug use: No     Allergies   Sulfa antibiotics, Azithromycin, Codeine, Elemental sulfur, Lovastatin, and Miconazole nitrate   Review of Systems Review of Systems  Genitourinary:  Positive for frequency, hematuria and urgency.  All other systems reviewed and are  negative.   Physical Exam Triage Vital Signs ED Triage Vitals  Enc Vitals Group     BP 06/09/21 1309 123/80     Pulse Rate 06/09/21 1309 69     Resp 06/09/21 1309 16     Temp 06/09/21 1309 98.1 F (36.7 C)     Temp Source 06/09/21 1309 Oral     SpO2 06/09/21 1309 97 %     Weight 06/09/21 1310 130 lb (59 kg)     Height --      Head Circumference --      Peak Flow --      Pain Score 06/09/21 1310 3     Pain Loc --      Pain Edu? --      Excl. in [place]? --    No data found.  Updated Vital Signs BP 123/80 (BP Location: Left Arm)   Pulse 69   Temp 98.1 F (36.7 C) (Oral)   Resp 16   Wt 130 lb (59 kg)   SpO2 97%   BMI 23.78 kg/m      Physical Exam Vitals and nursing note reviewed.  Constitutional:      General: She is not in acute distress.    Appearance: Normal appearance. She is normal weight. She is not ill-appearing.  HENT:     Head: Normocephalic and atraumatic.     Mouth/Throat:     Mouth: Mucous membranes are moist.     Pharynx: Oropharynx is clear.  Eyes:     Extraocular Movements: Extraocular movements intact.     Conjunctiva/sclera: Conjunctivae normal.     Pupils: Pupils are equal, round, and reactive to light.  Cardiovascular:     Rate and Rhythm: Normal rate and regular rhythm.  Pulmonary:     Effort: Pulmonary effort is normal.     Breath sounds: Normal breath sounds.  Abdominal:     Tenderness: There is no right CVA tenderness or left CVA tenderness.  Musculoskeletal:        General: Normal range of motion.     Cervical back: Normal range of motion and neck supple.  Skin:    General: Skin is warm and dry.  Neurological:     General: No focal deficit present.     Mental Status: She is alert and oriented to person, place, and time.     UC Treatments / Results  Labs (all labs ordered are listed, but only abnormal results are displayed) Labs Reviewed  POCT URINALYSIS DIP (MANUAL ENTRY) - Abnormal; Notable for the following components:       Result Value   Color, UA orange (*)  Glucose, UA =100 (*)    Bilirubin, UA small (*)    Spec Grav, UA >=1.030 (*)    Blood, UA small (*)    Protein Ur, POC =100 (*)    Urobilinogen, UA 2.0 (*)    Nitrite, UA Positive (*)    All other components within normal limits  URINE CULTURE    EKG   Radiology No results found.  Procedures Procedures (including critical care time)  Medications Ordered in UC Medications - No data to display  Initial Impression / Assessment and Plan / UC Course  I have reviewed the triage vital signs and the nursing notes.  Pertinent labs & imaging results that were available during my care of the patient were reviewed by me and considered in my medical decision making (see chart for details).    MDM: 1.  Acute cystitis with hematuria-Rx'd Macrobid. Advised patient to take medication as directed with food to completion.  Encouraged patient increase daily water intake while taking this medication.  Advised patient we will follow-up with urine culture results once received.  Patient discharged home, hemodynamically stable. Final Clinical Impressions(s) / UC Diagnoses   Final diagnoses:  Acute cystitis with hematuria     Discharge Instructions      Advised patient to take medication as directed with food to completion.  Encouraged patient increase daily water intake while taking this medication.  Advised patient we will follow-up with urine culture results once received.     ED Prescriptions     Medication Sig Dispense Auth. Provider   nitrofurantoin, macrocrystal-monohydrate, (MACROBID) 100 MG capsule Take 1 capsule (100 mg total) by mouth 2 (two) times daily for 7 days. 14 capsule Eliezer Lofts, FNP      PDMP not reviewed this encounter.   Eliezer Lofts, [place] 06/09/21 1429

## 2021-06-10 ENCOUNTER — Ambulatory Visit: Payer: Medicare Other

## 2021-06-10 ENCOUNTER — Other Ambulatory Visit: Payer: Medicare Other

## 2021-06-12 LAB — URINE CULTURE
MICRO NUMBER:: 12692221
SPECIMEN QUALITY:: ADEQUATE

## 2021-06-17 DIAGNOSIS — G4483 Primary cough headache: Secondary | ICD-10-CM | POA: Diagnosis not present

## 2021-06-17 DIAGNOSIS — R519 Headache, unspecified: Secondary | ICD-10-CM | POA: Diagnosis not present

## 2021-06-19 ENCOUNTER — Ambulatory Visit: Payer: Medicare Other | Admitting: Pharmacist

## 2021-06-19 ENCOUNTER — Other Ambulatory Visit: Payer: Self-pay

## 2021-06-19 DIAGNOSIS — R519 Headache, unspecified: Secondary | ICD-10-CM

## 2021-06-19 DIAGNOSIS — E7800 Pure hypercholesterolemia, unspecified: Secondary | ICD-10-CM

## 2021-06-19 NOTE — Patient Instructions (Signed)
Visit Information  Thank you for taking time to visit with me today. Please don't hesitate to contact me if I can be of assistance to you before our next scheduled telephone appointment.  Following are the goals we discussed today:   Patient Goals/Self-Care Activities Over the next 120 days, patient will:  take medications as prescribed and engage in dietary modifications by minimizing saturated and trans fat, limiting cholesterol intake  Follow Up Plan: Telephone follow up appointment with care management team member scheduled for:  4 months  Please call the care guide team at [phone] if you need to cancel or reschedule your appointment.   Patient verbalizes understanding of instructions provided today and agrees to view in [place].   Darius Bump

## 2021-06-19 NOTE — Progress Notes (Signed)
Chronic Care Management Pharmacy Note  06/19/2021 Name:  Stephanie Stone MRN:  [id number] DOB:  05-01-1951  Summary: addressed hyperlipidemia, chronic pain/headache.Patient saw neurology which started a medrol dosepak to help with headaches. She filled her new rosuvastatin but has not started yet because she wanted to wait until she was through the steroid course.  Recommendations/Changes made from today's visit: continue lifestyle modifications for impact on cholesterol, recommend repeat lipid panel 3 months after start of rosuvastatin (~ March 2023) and titrate statin if need be.  Plan: f/u with pharmacist in 4 months  Subjective: Stephanie Stone is an 70 y.o. year old female who is a primary patient of [name], Rene Kocher, MD.  The CCM team was consulted for assistance with disease management and care coordination needs.    Engaged with patient by telephone for follow up visit in response to provider referral for pharmacy case management and/or care coordination services.   Consent to Services:  The patient was given information about Chronic Care Management services, agreed to services, and gave verbal consent prior to initiation of services.  Please see initial visit note for detailed documentation.   Patient Care Team: [name], MD as PCP - General Darius Bump, [facility] as Pharmacist (Pharmacist)   Objective:  Lab Results  Component Value Date   CREATININE 0.81 05/19/2021   CREATININE 0.91 03/09/2021   CREATININE 0.87 11/11/2020       Component Value Date/Time   CHOL 217 (H) 11/11/2020 0818   TRIG 77 11/11/2020 0818   TRIG 117 12/06/2008 0000   HDL 56 11/11/2020 0818   CHOLHDL 3.9 11/11/2020 0818   VLDL 19 09/23/2016 0849   LDLCALC 143 (H) 11/11/2020 0818    Hepatic Function Latest Ref Rng & Units 11/11/2020 09/23/2016 10/07/2014  Total Protein 6.1 - 8.1 g/dL 6.6 6.2 6.9  Albumin 3.6 - 5.1 g/dL - 4.1 4.7  AST 10 - 35 U/L 19 18 18   ALT 6 - 29 U/L 18 15 14   Alk  Phosphatase 33 - 130 U/L - 86 100  Total Bilirubin 0.2 - 1.2 mg/dL 0.5 0.5 0.4  Bilirubin, Direct 0.0 - 0.3 mg/dL - - -    Lab Results  Component Value Date/Time   TSH 4.60 (H) 11/11/2020 08:18 AM   TSH 4.94 04/27/2013 12:00 AM   TSH 3.804 08/06/2010 06:02 PM    CBC Latest Ref Rng & Units 03/09/2021 11/11/2020 09/23/2016  WBC 3.8 - 10.8 Thousand/uL 6.4 5.9 4.7  Hemoglobin 11.7 - 15.5 g/dL 14.9 15.8(H) 14.1  Hematocrit 35.0 - 45.0 % 46.0(H) 47.5(H) 42.6  Platelets 140 - 400 Thousand/uL 230 226 234    Clinical ASCVD: The 10-year ASCVD risk score (Arnett DK, et al., 2019) is: 9.1%   Values used to calculate the score:     Age: 48 years     Sex: Female     Is Non-Hispanic African American: No     Diabetic: No     Tobacco smoker: No     Systolic Blood Pressure: 902 mmHg     Is BP treated: No     HDL Cholesterol: 56 mg/dL     Total Cholesterol: 217 mg/dL     Social History   Tobacco Use  Smoking Status Never  Smokeless Tobacco Never   BP Readings from Last 3 Encounters:  06/09/21 123/80  05/19/21 132/80  03/09/21 140/75   Pulse Readings from Last 3 Encounters:  06/09/21 69  05/19/21 75  03/09/21 88  Wt Readings from Last 3 Encounters:  06/09/21 130 lb (59 kg)  05/19/21 134 lb (60.8 kg)  03/09/21 132 lb (59.9 kg)    Assessment: Review of patient past medical history, allergies, medications, health status, including review of consultants reports, laboratory and other test data, was performed as part of comprehensive evaluation and provision of chronic care management services.   SDOH:  (Social Determinants of Health) assessments and interventions performed:    CCM Care Plan  Allergies  Allergen Reactions   Sulfa Antibiotics     Other reaction(s): Unknown   Azithromycin Nausea Only   Codeine     REACTION: NAUSEA   Elemental Sulfur    Lovastatin    Miconazole Nitrate     REACTION: Nausea    Medications Reviewed Today     Reviewed by Page Spiro,  RN (Registered Nurse) on 06/09/21 at 1315  Med List Status: <None>   Medication Order Taking? Sig Documenting Provider Last Dose Status Informant  aspirin 81 MG EC tablet [id number]  Take 81 mg by mouth daily. [provider]  Active   Biotin 1000 MCG tablet [id number]  Take 1,000 mcg by mouth daily. [provider]  Active   cholecalciferol (VITAMIN D3) 25 MCG (1000 UNIT) tablet [id number]  Take 1,000 Units by mouth daily. [provider]  Active   eletriptan (RELPAX) 20 MG tablet [id number]  Take 1 tablet (20 mg total) by mouth as needed. may repeat in 2 hours if necessary [name], MD  Active   fluocinonide (LIDEX) 0.05 % external solution [id number]  Apply 1 application topically at bedtime. [provider]  Active   fluticasone (FLONASE) 50 MCG/ACT nasal spray [id number]  SHAKE LIQUID AND USE 2 SPRAYS IN EACH NOSTRIL DAILY [name], MD  Active   gabapentin (NEURONTIN) 100 MG capsule [id number]  Take 1 capsule (100 mg total) by mouth 3 (three) times daily. [name], MD  Active   MINOXIDIL, TOPICAL, 5 % SOLN [id number]  Apply 1 application topically at bedtime. [name], MD  Active   rosuvastatin (CRESTOR) 5 MG tablet [id number]  Take 1 tablet (5 mg total) by mouth at bedtime as needed. [name], MD  Active   vitamin E 200 UNIT capsule [id number]  Take 200 Units by mouth daily. [provider]  Active   Zinc 22.5 MG TABS [id number]  Take by mouth daily. [provider]  Active             Patient Active Problem List   Diagnosis Date Noted   Sleep disturbance 05/19/2021   Degenerative disc disease, cervical 08/07/2019   Occipital neuralgia of right side 08/07/2019   Slow transit constipation 10/01/2014   Hyperlipidemia 08/11/2010   DEPRESSION, SITUATIONAL 08/11/2010   ELEVATED BLOOD PRESSURE WITHOUT DIAGNOSIS OF HYPERTENSION 07/08/2010   ALOPECIA AREATA 05/29/2010   MENOPAUSE,  PREMATURE 02/17/2007   Migraine headache 02/17/2007   MVP (mitral valve prolapse) 02/17/2007    Immunization History  Administered Date(s) Administered   Fluad Quad(high Dose 65+) 05/07/2019, 05/07/2020, 05/19/2021   Influenza, High Dose Seasonal PF 05/02/2017   Influenza,inj,Quad PF,6+ Mos 04/20/2013, 05/27/2016, 05/17/2018   Influenza-Unspecified 04/03/2014   PFIZER Comirnaty(Gray Top)Covid-19 Tri-Sucrose Vaccine 10/29/2020   PFIZER(Purple Top)SARS-COV-2 Vaccination 08/17/2019, 09/10/2019   Pneumococcal Conjugate-13 05/02/2017   Pneumococcal Polysaccharide-23 05/17/2018   Td 07/13/2003   Tdap 12/12/2015    Conditions to be addressed/monitored: HLD and chronic pain  There are no care plans that you  recently modified to display for this patient.   Medication Assistance: None required.  Patient affirms current coverage meets needs.  Patient's preferred pharmacy is:  [id number] - [place], [place] - [address] AT [place] [address] [place] [address] Phone: [phone] Fax: [phone]  Uses pill box? No - not taking many medications, current system doing well for her Pt endorses 100% compliance  Follow Up:  Patient agrees to Care Plan and Follow-up.  Plan: Telephone follow up appointment with care management team member scheduled for:  4 months  Larinda Buttery, PharmD Clinical Pharmacist [facility] [phone]

## 2021-07-21 DIAGNOSIS — R519 Headache, unspecified: Secondary | ICD-10-CM | POA: Diagnosis not present

## 2021-07-31 ENCOUNTER — Other Ambulatory Visit: Payer: Self-pay | Admitting: Family Medicine

## 2021-09-03 ENCOUNTER — Telehealth: Payer: Self-pay | Admitting: *Deleted

## 2021-09-03 NOTE — Chronic Care Management (AMB) (Signed)
°  Care Management   Note  09/03/2021 Name: MUNIRAH DOERNER MRN: [id number] DOB: October 31, 1950  BLONDINE HOTTEL is a 71 y.o. year old female who is a primary care patient of [name], MD and is actively engaged with the care management team. I reached out to Loreli Dollar by phone today to assist with re-scheduling a follow up visit with the Pharmacist  Follow up plan: Unsuccessful telephone outreach attempt made. A HIPAA compliant phone message was left for the patient providing contact information and requesting a return call.   Julian Hy, [place] Management  Direct Dial: [phone]

## 2021-09-04 NOTE — Chronic Care Management (AMB) (Signed)
°  Care Management   Note  09/04/2021 Name: Stephanie Stone MRN: [id number] DOB: [birth year]  Stephanie Stone is a 71 y.o. year old female who is a primary care patient of [name], MD and is actively engaged with the care management team. I reached out to Loreli Dollar by phone today to assist with re-scheduling a follow up visit with the Pharmacist  Follow up plan: Telephone appointment with care management team member scheduled for: 12/11/2021  Julian Hy, [place], [place] Management  Direct Dial: [phone]

## 2021-10-16 ENCOUNTER — Telehealth: Payer: Medicare Other

## 2021-11-03 ENCOUNTER — Emergency Department (INDEPENDENT_AMBULATORY_CARE_PROVIDER_SITE_OTHER)
Admission: EM | Admit: 2021-11-03 | Discharge: 2021-11-03 | Disposition: A | Discharge status: Home / Self Care | Payer: Medicare Other | Source: Home / Self Care | Attending: Family Medicine | Admitting: Family Medicine

## 2021-11-03 ENCOUNTER — Emergency Department (INDEPENDENT_AMBULATORY_CARE_PROVIDER_SITE_OTHER): Payer: Medicare Other

## 2021-11-03 DIAGNOSIS — R0989 Other specified symptoms and signs involving the circulatory and respiratory systems: Secondary | ICD-10-CM | POA: Diagnosis not present

## 2021-11-03 DIAGNOSIS — J069 Acute upper respiratory infection, unspecified: Secondary | ICD-10-CM

## 2021-11-03 DIAGNOSIS — R5383 Other fatigue: Secondary | ICD-10-CM

## 2021-11-03 DIAGNOSIS — R053 Chronic cough: Secondary | ICD-10-CM

## 2021-11-03 DIAGNOSIS — R059 Cough, unspecified: Secondary | ICD-10-CM

## 2021-11-03 MED ORDER — GUAIFENESIN 200 MG PO TABS: ORAL_TABLET | ORAL | 1 refills | Status: DC

## 2021-11-03 MED ORDER — CEFDINIR 300 MG PO CAPS: ORAL_CAPSULE | ORAL | 0 refills | Status: DC

## 2021-11-03 NOTE — ED Triage Notes (Signed)
Pt presents with cough, congestion, and fatigue that began Easter Sunday. Pt has had negative covid test x 2 ?

## 2021-11-03 NOTE — ED Provider Notes (Signed)
?[facility] ? ? ? ?CSN: [id number] ?Arrival date & time: 11/03/21  0623 ? ? ?  ? ?History   ?Chief Complaint ?Chief Complaint  ?Patient presents with  ? Cough  ? Nasal Congestion  ? ? ?HPI ?Stephanie Stone is a 71 y.o. female.  ? ?Patient developed sore throat, nasal drainage, and cough 2 weeks ago.  The is generally productive and worse at night.  She complains of persistent low grade fever to 100, but denies pleuritic pain and shortness of breath. ? ?The history is provided by the patient.  ? ?Past Medical History:  ?Diagnosis Date  ? Allergy   ? Alopecia   ? areata  ? Anxiety   ? situational  ? Depression   ? Hyperlipidemia   ? Hypertension   ? Lung infiltrate 07/12/2008  ? Resolved on its own without biopsy  ? Menopause, premature   ? Migraines   ? MVP (mitral valve prolapse)   ? ? ?Patient Active Problem List  ? Diagnosis Date Noted  ? Sleep disturbance 05/19/2021  ? Degenerative disc disease, cervical 08/07/2019  ? Occipital neuralgia of right side 08/07/2019  ? Slow transit constipation 10/01/2014  ? Hyperlipidemia 08/11/2010  ? DEPRESSION, SITUATIONAL 08/11/2010  ? ELEVATED BLOOD PRESSURE WITHOUT DIAGNOSIS OF HYPERTENSION 07/08/2010  ? ALOPECIA AREATA 05/29/2010  ? MENOPAUSE, PREMATURE 02/17/2007  ? Migraine headache 02/17/2007  ? MVP (mitral valve prolapse) 02/17/2007  ? ? ?Past Surgical History:  ?Procedure Laterality Date  ? ABDOMINAL HYSTERECTOMY  07/12/1977  ? endometriosis  ? APPENDECTOMY    ? ? ?OB History   ?No obstetric history on file. ?  ? ? ? ?Home Medications   ? ?Prior to Admission medications   ?Medication Sig Start Date End Date Taking? Authorizing Provider  ?cefdinir (OMNICEF) 300 MG capsule Take one cap PO Q12hr 11/03/21  Yes Kandra Nicolas, MD  ?guaiFENesin 200 MG tablet Take 1 or 2 tabs PO every 4 hours daytime for cough to loosen phlegm 11/03/21  Yes Kandra Nicolas, MD  ?aspirin 81 MG EC tablet Take 81 mg by mouth daily.    [provider]  ?Biotin 1000 MCG tablet Take  1,000 mcg by mouth daily.    [provider]  ?cholecalciferol (VITAMIN D3) 25 MCG (1000 UNIT) tablet Take 1,000 Units by mouth daily.    [provider]  ?eletriptan (RELPAX) 20 MG tablet Take 1 tablet (20 mg total) by mouth as needed. may repeat in 2 hours if necessary 07/31/19   [name], MD  ?fluocinonide (LIDEX) 0.05 % external solution Apply 1 application topically at bedtime.    [provider]  ?fluticasone (FLONASE) 50 MCG/ACT nasal spray SHAKE LIQUID AND USE 2 SPRAYS IN EACH NOSTRIL DAILY 07/31/21   [name], MD  ?gabapentin (NEURONTIN) 100 MG capsule Take 1 capsule (100 mg total) by mouth 3 (three) times daily. 05/25/21   [name], MD  ?methylPREDNISolone (MEDROL DOSEPAK) 4 MG TBPK tablet Take 4 mg by mouth as directed. Medrol dosepak started 06/19/21 for 6 day course ?Patient not taking: Reported on 11/03/2021    [provider]  ?MINOXIDIL, TOPICAL, 5 % SOLN Apply 1 application topically at bedtime. 05/07/19   [name], MD  ?rosuvastatin (CRESTOR) 5 MG tablet Take 1 tablet (5 mg total) by mouth at bedtime as needed. ?Patient not taking: Reported on 06/19/2021 06/02/21   [name], MD  ?vitamin E 200 UNIT capsule Take 200 Units by  mouth daily.    [provider]  ?Zinc 22.5 MG TABS Take by mouth daily.    [provider]  ? ? ?Family History ?Family History  ?Problem Relation Age of Onset  ? Heart attack Mother   ? Hypertension Mother   ? Coronary artery disease Mother 43  ? Liver cancer Mother   ? Cancer Mother   ? Heart disease Mother   ? Heart attack Father   ? Early death Father   ? Heart disease Father   ? Heart attack Brother 34  ? Stroke Brother   ? Heart disease Brother   ? ? ?Social History ?Social History  ? ?Tobacco Use  ? Smoking status: Never  ? Smokeless tobacco: Never  ?Vaping Use  ? Vaping Use: Never used  ?Substance Use Topics  ? Alcohol use: Yes  ?  Alcohol/week: 1.0 standard  drink  ?  Types: 1 Glasses of wine per week  ?  Comment: maybe once a month  ? Drug use: No  ? ? ? ?Allergies   ?Sulfa antibiotics, Azithromycin, Codeine, Elemental sulfur, Lovastatin, and Miconazole nitrate ? ? ?Review of Systems ?Review of Systems ?+ sore throat ?+ cough ?No pleuritic pain ?No wheezing ?+ nasal congestion ?+ post-nasal drainage ?No sinus pain/pressure ?No itchy/red eyes ?No earache ?No hemoptysis ?No SOB ?+ fever, + chills ?No nausea ?No vomiting ?No abdominal pain ?No diarrhea ?No urinary symptoms ?No skin rash ?+ fatigue ?No myalgias ?No headache ?Used OTC meds (Alka Seltzer Cold/flu) without relief  ? ?Physical Exam ?Triage Vital Signs ?ED Triage Vitals  ?Enc Vitals Group  ?   BP 11/03/21 0936 (!) 152/92  ?   Pulse Rate 11/03/21 0936 83  ?   Resp 11/03/21 0936 14  ?   Temp 11/03/21 0936 97.7 ?F (36.5 ?C)  ?   Temp Source 11/03/21 0936 Oral  ?   SpO2 11/03/21 0936 99 %  ?   Weight --   ?   Height --   ?   Head Circumference --   ?   Peak Flow --   ?   Pain Score 11/03/21 0938 0  ?   Pain Loc --   ?   Pain Edu? --   ?   Excl. in [place]? --   ? ?No data found. ? ?Updated Vital Signs ?BP (!) 152/92 (BP Location: Right Arm)   Pulse 83   Temp 97.7 ?F (36.5 ?C) (Oral)   Resp 14   SpO2 99%  ? ?Visual Acuity ?Right Eye Distance:   ?Left Eye Distance:   ?Bilateral Distance:   ? ?Right Eye Near:   ?Left Eye Near:    ?Bilateral Near:    ? ?Physical Exam ?Nursing notes and Vital Signs reviewed. ?Appearance:  Patient appears stated age, and in no acute distress ?Eyes:  Pupils are equal, round, and reactive to light and accomodation.  Extraocular movement is intact.  Conjunctivae are not inflamed  ?Ears:  Canals normal.  Tympanic membranes normal.  ?Nose:  Mildly congested turbinates.  No sinus tenderness.   ?Pharynx:  Normal ?Neck:  Supple.  Mildly enlarged lateral nodes are present, tender to palpation on the left.   ?Lungs:   Faint wheeze heard left superior anterior chest.  Breath sounds are equal.    ?Heart:  Regular rate and rhythm without murmurs, rubs, or gallops.  ?Abdomen:  Nontender without masses or hepatosplenomegaly.  Bowel sounds are present.  No CVA or flank tenderness.  ?Extremities:  No edema.  ?Skin:  No rash present. ? ? ?UC Treatments / Results  ?Labs ?(all labs ordered are listed, but only abnormal results are displayed) ?Labs Reviewed - No data to display ? ?EKG ? ? ?Radiology ?DG Chest 2 View ? ?Result Date: 11/03/2021 ?CLINICAL DATA:  Cough, chest congestion, fatigue EXAM: CHEST - 2 VIEW COMPARISON:  Previous studies including the examination of 06/26/2020 FINDINGS: Cardiac size is within normal limits. There are no signs of pulmonary edema or new focal pulmonary consolidation. Linear densities seen in the anterior left lower lung fields have not changed significantly suggesting scarring. There is nodular pleural thickening in both apices with no significant interval change. There is no pleural effusion or pneumothorax. IMPRESSION: There are no new infiltrates or signs of pulmonary edema. There is no pleural effusion or pneumothorax. Electronically Signed   By: Elmer Picker M.D.   On: 11/03/2021 10:19   ? ?Procedures ?Procedures (including critical care time) ? ?Medications Ordered in UC ?Medications - No data to display ? ?Initial Impression / Assessment and Plan / UC Course  ?I have reviewed the triage vital signs and the nursing notes. ? ?Pertinent labs & imaging results that were available during my care of the patient were reviewed by me and considered in my medical decision making (see chart for details). ? ?  ?Persistent fever at home and lack of improvement are of concern. ?Begin Omnicef. ?Followup with Family Doctor if not improved in one week.  ? ? ?Final Clinical Impressions(s) / UC Diagnoses  ? ?Final diagnoses:  ?Viral URI with cough  ?Persistent cough  ? ? ? ?Discharge Instructions   ? ?  ?Take plenty of water while taking guaifenesin for cough and congestion.  May add  Pseudoephedrine ('30mg'$ , one tab every 4 to 6 hours) for sinus congestion.  Get adequate rest.   ?May use Afrin nasal spray (or generic oxymetazoline) each morning for about 5 days and then discontinue.  Also recom

## 2021-11-03 NOTE — Discharge Instructions (Signed)
Take plenty of water while taking guaifenesin for cough and congestion.  May add Pseudoephedrine ('30mg'$ , one tab every 4 to 6 hours) for sinus congestion.  Get adequate rest.   ?May use Afrin nasal spray (or generic oxymetazoline) each morning for about 5 days and then discontinue.  Also recommend using saline nasal spray several times daily and saline nasal irrigation (AYR is a common brand).  Use Flonase nasal spray each morning after using Afrin nasal spray and saline nasal irrigation. ?Try warm salt water gargles for sore throat.  ?Stop all antihistamines for now, and other non-prescription cough/cold preparations. ?May take Delsym Cough Suppressant ("12 Hour Cough Relief") at bedtime for nighttime cough.  ?  ?

## 2021-12-11 ENCOUNTER — Ambulatory Visit (INDEPENDENT_AMBULATORY_CARE_PROVIDER_SITE_OTHER): Payer: Medicare Other | Admitting: Pharmacist

## 2021-12-11 DIAGNOSIS — E7800 Pure hypercholesterolemia, unspecified: Secondary | ICD-10-CM

## 2021-12-11 DIAGNOSIS — M5481 Occipital neuralgia: Secondary | ICD-10-CM

## 2021-12-11 NOTE — Progress Notes (Signed)
Chronic Care Management Pharmacy Note  12/11/2021 Name:  Stephanie Stone MRN:  [id number] DOB:  [birth year]  Summary: addressed hyperlipidemia, chronic pain/headache.  She filled her new rosuvastatin but has not started yet.   Recommendations/Changes made from today's visit: continue lifestyle modifications and check lipid panel at next PCP visit, initiate statin.  Plan: f/u with pharmacist in 3 months  Subjective: Stephanie Stone is an 71 y.o. year old female who is a primary patient of [name], Rene Kocher, MD.  The CCM team was consulted for assistance with disease management and care coordination needs.    Engaged with patient by telephone for follow up visit in response to provider referral for pharmacy case management and/or care coordination services.   Consent to Services:  The patient was given information about Chronic Care Management services, agreed to services, and gave verbal consent prior to initiation of services.  Please see initial visit note for detailed documentation.   Patient Care Team: [name], MD as PCP - General Darius Bump, [facility] as Pharmacist (Pharmacist)   Objective:  Lab Results  Component Value Date   CREATININE 0.81 05/19/2021   CREATININE 0.91 03/09/2021   CREATININE 0.87 11/11/2020       Component Value Date/Time   CHOL 217 (H) 11/11/2020 0818   TRIG 77 11/11/2020 0818   TRIG 117 12/06/2008 0000   HDL 56 11/11/2020 0818   CHOLHDL 3.9 11/11/2020 0818   VLDL 19 09/23/2016 0849   LDLCALC 143 (H) 11/11/2020 0818       Latest Ref Rng & Units 11/11/2020    8:18 AM 09/23/2016    8:49 AM 10/07/2014    8:27 AM  Hepatic Function  Total Protein 6.1 - 8.1 g/dL 6.6   6.2   6.9    Albumin 3.6 - 5.1 g/dL  4.1   4.7    AST 10 - 35 U/L 19   18   18     ALT 6 - 29 U/L 18   15   14     Alk Phosphatase 33 - 130 U/L  86   100    Total Bilirubin 0.2 - 1.2 mg/dL 0.5   0.5   0.4      Lab Results  Component Value Date/Time   TSH 4.60 (H)  11/11/2020 08:18 AM   TSH 4.94 04/27/2013 12:00 AM   TSH 3.804 08/06/2010 06:02 PM       Latest Ref Rng & Units 03/09/2021   12:00 AM 11/11/2020    8:18 AM 09/23/2016    8:52 AM  CBC  WBC 3.8 - 10.8 Thousand/uL 6.4   5.9   4.7    Hemoglobin 11.7 - 15.5 g/dL 14.9   15.8   14.1    Hematocrit 35.0 - 45.0 % 46.0   47.5   42.6    Platelets 140 - 400 Thousand/uL 230   226   234      Clinical ASCVD: The 10-year ASCVD risk score (Arnett DK, et al., 2019) is: 13.3%   Values used to calculate the score:     Age: 2 years     Sex: Female     Is Non-Hispanic African American: No     Diabetic: No     Tobacco smoker: No     Systolic Blood Pressure: 470 mmHg     Is BP treated: No     HDL Cholesterol: 56 mg/dL     Total Cholesterol: 217 mg/dL  Social History   Tobacco Use  Smoking Status Never  Smokeless Tobacco Never   BP Readings from Last 3 Encounters:  11/03/21 (!) 152/92  06/09/21 123/80  05/19/21 132/80   Pulse Readings from Last 3 Encounters:  11/03/21 83  06/09/21 69  05/19/21 75   Wt Readings from Last 3 Encounters:  06/09/21 130 lb (59 kg)  05/19/21 134 lb (60.8 kg)  03/09/21 132 lb (59.9 kg)    Assessment: Review of patient past medical history, allergies, medications, health status, including review of consultants reports, laboratory and other test data, was performed as part of comprehensive evaluation and provision of chronic care management services.   SDOH:  (Social Determinants of Health) assessments and interventions performed:    CCM Care Plan  Allergies  Allergen Reactions   Sulfa Antibiotics     Other reaction(s): Unknown   Azithromycin Nausea Only   Codeine     REACTION: NAUSEA   Elemental Sulfur    Lovastatin    Miconazole Nitrate     REACTION: Nausea    Medications Reviewed Today     Reviewed by Talbert Nan, RN (Registered Nurse) on 11/03/21 at [phone]  Med List Status: <None>   Medication Order Taking? Sig Documenting Provider  Last Dose Status Informant  aspirin 81 MG EC tablet [id number]  Take 81 mg by mouth daily. [provider]  Active   Biotin 1000 MCG tablet [id number]  Take 1,000 mcg by mouth daily. [provider]  Active   cholecalciferol (VITAMIN D3) 25 MCG (1000 UNIT) tablet [id number]  Take 1,000 Units by mouth daily. [provider]  Active   eletriptan (RELPAX) 20 MG tablet [id number]  Take 1 tablet (20 mg total) by mouth as needed. may repeat in 2 hours if necessary [name], MD  Active   fluocinonide (LIDEX) 0.05 % external solution [id number]  Apply 1 application topically at bedtime. [provider]  Active   fluticasone (FLONASE) 50 MCG/ACT nasal spray [id number]  SHAKE LIQUID AND USE 2 SPRAYS IN EACH NOSTRIL DAILY [name], MD  Active   gabapentin (NEURONTIN) 100 MG capsule [id number]  Take 1 capsule (100 mg total) by mouth 3 (three) times daily. [name], MD  Active   methylPREDNISolone (MEDROL DOSEPAK) 4 MG TBPK tablet [id number] No Take 4 mg by mouth as directed. Medrol dosepak started 06/19/21 for 6 day course  Patient not taking: Reported on 11/03/2021   [provider] Not Taking Active   MINOXIDIL, TOPICAL, 5 % SOLN [id number]  Apply 1 application topically at bedtime. [name], MD  Active   rosuvastatin (CRESTOR) 5 MG tablet [id number]  Take 1 tablet (5 mg total) by mouth at bedtime as needed.  Patient not taking: Reported on 06/19/2021   [name], MD  Active            Med Note Jens Som, Felecia Shelling   Fri Jun 19, 2021  9:16 AM) Patient filled, holding for now while doing short course steroid and plans to begin after.    vitamin E 200 UNIT capsule [id number]  Take 200 Units by mouth daily. [provider]  Active   Zinc 22.5 MG TABS [id number]  Take by mouth daily. [provider]  Active             Patient Active Problem List   Diagnosis Date Noted   Sleep disturbance  05/19/2021   Degenerative disc disease, cervical 08/07/2019   Occipital  neuralgia of right side 08/07/2019   Slow transit constipation 10/01/2014   Hyperlipidemia 08/11/2010   DEPRESSION, SITUATIONAL 08/11/2010   ELEVATED BLOOD PRESSURE WITHOUT DIAGNOSIS OF HYPERTENSION 07/08/2010   ALOPECIA AREATA 05/29/2010   MENOPAUSE, PREMATURE 02/17/2007   Migraine headache 02/17/2007   MVP (mitral valve prolapse) 02/17/2007    Immunization History  Administered Date(s) Administered   Fluad Quad(high Dose 65+) 05/07/2019, 05/07/2020, 05/19/2021   Influenza, High Dose Seasonal PF 05/02/2017   Influenza,inj,Quad PF,6+ Mos 04/20/2013, 05/27/2016, 05/17/2018   Influenza-Unspecified 04/03/2014   PFIZER Comirnaty(Gray Top)Covid-19 Tri-Sucrose Vaccine 10/29/2020   PFIZER(Purple Top)SARS-COV-2 Vaccination 08/17/2019, 09/10/2019   Pneumococcal Conjugate-13 05/02/2017   Pneumococcal Polysaccharide-23 05/17/2018   Td 07/13/2003   Tdap 12/12/2015    Conditions to be addressed/monitored: HLD and chronic pain  There are no care plans that you recently modified to display for this patient.    Medication Assistance: None required.  Patient affirms current coverage meets needs.  Patient's preferred pharmacy is:  [id number] - [place], [place] - [address] AT [place] [address] [place] [address] Phone: [phone] Fax: [phone]   Uses pill box? No - not taking many medications, current system doing well for her Pt endorses 100% compliance  Follow Up:  Patient agrees to Care Plan and Follow-up.  Plan: Telephone follow up appointment with care management team member scheduled for:  3 months  Larinda Buttery, PharmD Clinical Pharmacist [facility] [phone]

## 2021-12-11 NOTE — Patient Instructions (Signed)
Visit Information  Don't forget to schedule your annual primary care visit with Dr. Madilyn Fireman!   Thank you for taking time to visit with me today. Please don't hesitate to contact me if I can be of assistance to you before our next scheduled telephone appointment.  Following are the goals we discussed today:  Patient Goals/Self-Care Activities Over the next 90 days, patient will:  take medications as prescribed and engage in dietary modifications by minimizing saturated and trans fat, limiting cholesterol intake  Follow Up Plan: Telephone follow up appointment with care management team member scheduled for:  3 months  Please call the care guide team at [phone] if you need to cancel or reschedule your appointment.    Patient verbalizes understanding of instructions and care plan provided today and agrees to view in [place]. Active MyChart status and patient understanding of how to access instructions and care plan via MyChart confirmed with patient.     Stephanie Stone

## 2021-12-21 ENCOUNTER — Emergency Department (INDEPENDENT_AMBULATORY_CARE_PROVIDER_SITE_OTHER): Payer: Medicare Other

## 2021-12-21 ENCOUNTER — Emergency Department (INDEPENDENT_AMBULATORY_CARE_PROVIDER_SITE_OTHER)
Admission: EM | Admit: 2021-12-21 | Discharge: 2021-12-21 | Disposition: A | Discharge status: Home / Self Care | Payer: Medicare Other | Source: Home / Self Care

## 2021-12-21 DIAGNOSIS — R058 Other specified cough: Secondary | ICD-10-CM | POA: Diagnosis not present

## 2021-12-21 DIAGNOSIS — R000 Tachycardia, unspecified: Secondary | ICD-10-CM

## 2021-12-21 DIAGNOSIS — J189 Pneumonia, unspecified organism: Secondary | ICD-10-CM

## 2021-12-21 DIAGNOSIS — R059 Cough, unspecified: Secondary | ICD-10-CM

## 2021-12-21 DIAGNOSIS — R52 Pain, unspecified: Secondary | ICD-10-CM

## 2021-12-21 DIAGNOSIS — R918 Other nonspecific abnormal finding of lung field: Secondary | ICD-10-CM | POA: Diagnosis not present

## 2021-12-21 DIAGNOSIS — J479 Bronchiectasis, uncomplicated: Secondary | ICD-10-CM

## 2021-12-21 LAB — POCT INFLUENZA A/B
Influenza A, POC: NEGATIVE
Influenza B, POC: NEGATIVE

## 2021-12-21 LAB — POC SARS CORONAVIRUS 2 AG -  ED: SARS Coronavirus 2 Ag: NEGATIVE

## 2021-12-21 MED ORDER — ACETAMINOPHEN 325 MG PO TABS
650.0000 mg | ORAL_TABLET | Freq: Once | ORAL | Status: AC
  Administered 2021-12-21: 650 mg via ORAL

## 2021-12-21 MED ORDER — AMOXICILLIN-POT CLAVULANATE 875-125 MG PO TABS: 1.0000 | ORAL_TABLET | Freq: Two times a day (BID) | ORAL | 0 refills | Status: AC

## 2021-12-21 MED ORDER — DOXYCYCLINE HYCLATE 100 MG PO CAPS: 100.0000 mg | ORAL_CAPSULE | Freq: Two times a day (BID) | ORAL | 0 refills | Status: AC

## 2021-12-21 NOTE — ED Provider Notes (Signed)
[name]    CSN: [id number] Arrival date & time: 12/21/21  0914      History   Chief Complaint Chief Complaint  Patient presents with   Cough   Generalized Body Aches    HPI Stephanie Stone is a 71 y.o. female.   Pleasant 71yo female presents today with concerns of severe dry cough and severe body aches. Started on Thursday evening, but have gotten progressively worse. States she feels like she has the flu, the worst body aches are in her hands and her pelvis. She has had a low grade fever since Thursday. Took a home covid test on Saturday which was negative. She does report hx of "some type of lung infection in the 1990s that took three years to clear by an ID doctor, and was on three heavy antibiotics." She is unclear what the infection was, had bronchoscopy then. Has had recurrent lung issues since then. Last URI infection was in April this year. Pt has been taking OTC medications without relief. Denies SOB or wheezing. Did have a twinge of R upper chest pain last night, but denies active sx in office. Hx of pleurisy.   Cough Associated symptoms: fever and myalgias     Past Medical History:  Diagnosis Date   Allergy    Alopecia    areata   Anxiety    situational   Depression    Hyperlipidemia    Hypertension    Lung infiltrate 07/12/2008   Resolved on its own without biopsy   Menopause, premature    Migraines    MVP (mitral valve prolapse)     Patient Active Problem List   Diagnosis Date Noted   Sleep disturbance 05/19/2021   Degenerative disc disease, cervical 08/07/2019   Occipital neuralgia of right side 08/07/2019   Slow transit constipation 10/01/2014   Hyperlipidemia 08/11/2010   DEPRESSION, SITUATIONAL 08/11/2010   ELEVATED BLOOD PRESSURE WITHOUT DIAGNOSIS OF HYPERTENSION 07/08/2010   ALOPECIA AREATA 05/29/2010   MENOPAUSE, PREMATURE 02/17/2007   Migraine headache 02/17/2007   MVP (mitral valve prolapse) 02/17/2007    Past Surgical  History:  Procedure Laterality Date   ABDOMINAL HYSTERECTOMY  07/12/1977   endometriosis   APPENDECTOMY      OB History   No obstetric history on file.      Home Medications    Prior to Admission medications   Medication Sig Start Date End Date Taking? Authorizing Provider  amoxicillin-clavulanate (AUGMENTIN) 875-125 MG tablet Take 1 tablet by mouth every 12 (twelve) hours for 7 days. 12/21/21 12/28/21 Yes Joanthony Hamza L, PA  doxycycline (VIBRAMYCIN) 100 MG capsule Take 1 capsule (100 mg total) by mouth 2 (two) times daily for 7 days. 12/21/21 12/28/21 Yes Mckinze Poirier L, PA  aspirin 81 MG EC tablet Take 81 mg by mouth daily.    [provider]  Biotin 1000 MCG tablet Take 1,000 mcg by mouth daily.    [provider]  cholecalciferol (VITAMIN D3) 25 MCG (1000 UNIT) tablet Take 1,000 Units by mouth daily.    [provider]  eletriptan (RELPAX) 20 MG tablet Take 1 tablet (20 mg total) by mouth as needed. may repeat in 2 hours if necessary 07/31/19   [name], MD  fluocinonide (LIDEX) 0.05 % external solution Apply 1 application topically at bedtime.    [provider]  fluticasone (FLONASE) 50 MCG/ACT nasal spray SHAKE LIQUID AND USE 2 SPRAYS IN [facility] NOSTRIL DAILY 07/31/21   [name],  MD  gabapentin (NEURONTIN) 100 MG capsule Take 1 capsule (100 mg total) by mouth 3 (three) times daily. 05/25/21   [name], MD  MINOXIDIL, TOPICAL, 5 % SOLN Apply 1 application topically at bedtime. 05/07/19   [name], MD  rosuvastatin (CRESTOR) 5 MG tablet Take 1 tablet (5 mg total) by mouth at bedtime as needed. Patient not taking: Reported on 06/19/2021 06/02/21   [name], MD  vitamin E 200 UNIT capsule Take 200 Units by mouth daily.    [provider]  Zinc 22.5 MG TABS Take by mouth daily.    [provider]    Family History Family History  Problem Relation Age of Onset   Heart  attack Mother    Hypertension Mother    Coronary artery disease Mother 51   Liver cancer Mother    Cancer Mother    Heart disease Mother    Heart attack Father    Early death Father    Heart disease Father    Heart attack Brother 76   Stroke Brother    Heart disease Brother     Social History Social History   Tobacco Use   Smoking status: Never   Smokeless tobacco: Never  Vaping Use   Vaping Use: Never used  Substance Use Topics   Alcohol use: Yes    Alcohol/week: 1.0 standard drink of alcohol    Types: 1 Glasses of wine per week    Comment: maybe once a month   Drug use: No     Allergies   Sulfa antibiotics, Azithromycin, Codeine, Elemental sulfur, Lovastatin, and Miconazole nitrate   Review of Systems Review of Systems  Constitutional:  Positive for fever.  Respiratory:  Positive for cough.   Musculoskeletal:  Positive for myalgias.  As per HPI   Physical Exam Triage Vital Signs ED Triage Vitals  Enc Vitals Group     BP 12/21/21 0925 (!) 172/97     Pulse Rate 12/21/21 0925 (!) 111     Resp 12/21/21 0925 16     Temp 12/21/21 0925 99 F (37.2 C)     Temp Source 12/21/21 0925 Oral     SpO2 12/21/21 0925 97 %     Weight --      Height --      Head Circumference --      Peak Flow --      Pain Score 12/21/21 0927 6     Pain Loc --      Pain Edu? --      Excl. in [place]? --    No data found.  Updated Vital Signs BP (!) 172/97 (BP Location: Right Arm)   Pulse (!) 111   Temp 99 F (37.2 C) (Oral)   Resp 16   SpO2 97%   Visual Acuity Right Eye Distance:   Left Eye Distance:   Bilateral Distance:    Right Eye Near:   Left Eye Near:    Bilateral Near:     Physical Exam Vitals and nursing note reviewed.  Constitutional:      General: She is not in acute distress.    Appearance: Normal appearance. She is well-developed. She is ill-appearing. She is not toxic-appearing or diaphoretic.  HENT:     Head: Normocephalic and atraumatic.     Right Ear:  Tympanic membrane, ear canal and external ear normal. There is no impacted cerumen.     Left Ear: Tympanic membrane, ear canal and external  ear normal. There is no impacted cerumen.     Nose: Rhinorrhea present.     Mouth/Throat:     Mouth: Mucous membranes are moist.     Pharynx: Oropharynx is clear. Posterior oropharyngeal erythema (posterior pharynx) present.  Eyes:     Extraocular Movements: Extraocular movements intact.     Conjunctiva/sclera: Conjunctivae normal.     Pupils: Pupils are equal, round, and reactive to light.  Cardiovascular:     Rate and Rhythm: Regular rhythm. Tachycardia present.     Heart sounds: No murmur heard. Pulmonary:     Effort: Pulmonary effort is normal. No respiratory distress.     Breath sounds: Rhonchi (rhonchi, decreased breath sounds to RLL only) present.     Comments: Pt appears slightly short of breath speaking in long sentences Abdominal:     Palpations: Abdomen is soft.     Tenderness: There is no abdominal tenderness.  Musculoskeletal:        General: No swelling.     Cervical back: Normal range of motion and neck supple. No rigidity or tenderness.  Lymphadenopathy:     Cervical: No cervical adenopathy.  Skin:    General: Skin is warm and dry.     Capillary Refill: Capillary refill takes less than 2 seconds.  Neurological:     Mental Status: She is alert.  Psychiatric:        Mood and Affect: Mood normal.      UC Treatments / Results  Labs (all labs ordered are listed, but only abnormal results are displayed) Labs Reviewed  POC SARS CORONAVIRUS 2 AG -  ED  POCT INFLUENZA A/B    EKG   Radiology CT Chest Wo Contrast  Result Date: 12/21/2021 CLINICAL DATA:  Cough with body aches for 4 days. EXAM: CT CHEST WITHOUT CONTRAST TECHNIQUE: Multidetector CT imaging of the chest was performed following the standard protocol without IV contrast. RADIATION DOSE REDUCTION: This exam was performed according to the departmental  dose-optimization program which includes automated exposure control, adjustment of the mA and/or kV according to patient size and/or use of iterative reconstruction technique. COMPARISON:  Chest radiographs 11/03/2021 and 06/26/2020. FINDINGS: Cardiovascular: Relatively mild atherosclerosis of the aorta, great vessels and coronary arteries. No acute vascular findings on noncontrast imaging. The heart size is normal. There is no pericardial effusion. Mediastinum/Nodes: There are no enlarged mediastinal, hilar or axillary lymph nodes.Hilar assessment is limited by the lack of intravenous contrast. The thyroid gland, trachea and esophagus demonstrate no significant findings. Lungs/Pleura: No pleural effusion or pneumothorax. Multifocal airspace opacities are present within the right upper and lower lobes, suspicious for multilobar pneumonia. There is underlying chronic lung disease with biapical scarring (which appears radiographically stable) and scattered cylindrical bronchiectasis, greatest within the lingula and left lower lobe. Allowing for these chronic changes and superimposed right lung infiltrates, no suspicious pulmonary nodules are seen. Upper abdomen:  The visualized upper abdomen appears unremarkable. Musculoskeletal/Chest wall: There is no chest wall mass or suspicious osseous finding. IMPRESSION: 1. Multifocal airspace opacities within the right upper and lower lobes consistent with multilobar pneumonia. Recommend radiographic follow-up. 2. Underlying chronic lung disease with chronic biapical scarring and scattered cylindrical bronchiectasis. 3. No adenopathy or pleural effusion. 4. Coronary and Aortic Atherosclerosis (ICD10-I70.0). Electronically Signed   By: Richardean Sale M.D.   On: 12/21/2021 10:44    Procedures Procedures (including critical care time)  Medications Ordered in UC Medications  acetaminophen (TYLENOL) tablet 650 mg (650 mg Oral Given 12/21/21 1021)  Initial Impression /  Assessment and Plan / UC Course  I have reviewed the triage vital signs and the nursing notes.  Pertinent labs & imaging results that were available during my care of the patient were reviewed by me and considered in my medical decision making (see chart for details).     Multilobar pneumonia - pt does not meet criteria to require inpatient treatment. Start with dual therapy PO - augmentin and doxycycline. Pt with adverse reactions to azithromycin. Strict ER precautions reviewed with patient. Must follow up with PCP for repeat imaging to ensure resolution. Bronchiectasis - noted on CT scan. Likely a chronic problem causing exacerbation. Consider referral to pulmonology.  Final Clinical Impressions(s) / UC Diagnoses   Final diagnoses:  Pneumonia of both lungs due to infectious organism, unspecified part of lung  Bronchiectasis without acute exacerbation [facility])     Discharge Instructions      Your CT scan shows a multilobular pneumonia. Please start taking the antibiotics prescribed today. If you develop any worsening symptoms, please head to the ER for IV antibiotics. Alternate ibuprofen and tylenol for the aches. You can take plain mucinex to help break up the phlegm Please schedule a follow up with your PCP within the next 7 days for a recheck.  Start taking the antibiotic twice daily Do not take it within two hours of milk consumption, and avoid multivitamins while on the antibiotic.  Your CT scan suggests chronic bronchiectasis - I would recommend a referral from your PCP for a pulmonologist for management. Please avoid excessive sun exposure while taking. Drink plenty of water.       ED Prescriptions     Medication Sig Dispense Auth. Provider   amoxicillin-clavulanate (AUGMENTIN) 875-125 MG tablet Take 1 tablet by mouth every 12 (twelve) hours for 7 days. 14 tablet Elvenia Godden L, PA   doxycycline (VIBRAMYCIN) 100 MG capsule Take 1 capsule (100 mg total) by mouth 2  (two) times daily for 7 days. 14 capsule Zacari Stiff L, PA      PDMP not reviewed this encounter.   Chaney Malling, [place] 12/21/21 2014

## 2021-12-21 NOTE — Discharge Instructions (Addendum)
Your CT scan shows a multilobular pneumonia. Please start taking the antibiotics prescribed today. If you develop any worsening symptoms, please head to the ER for IV antibiotics. Alternate ibuprofen and tylenol for the aches. You can take plain mucinex to help break up the phlegm Please schedule a follow up with your PCP within the next 7 days for a recheck.  Start taking the antibiotic twice daily Do not take it within two hours of milk consumption, and avoid multivitamins while on the antibiotic.  Your CT scan suggests chronic bronchiectasis - I would recommend a referral from your PCP for a pulmonologist for management. Please avoid excessive sun exposure while taking. Drink plenty of water.

## 2021-12-21 NOTE — ED Triage Notes (Signed)
Pt c/o cough and bodyaches since Thurs night. Taking Alkaseltzer cough and cold and ibuprofen prn. COVID neg on Sat.

## 2021-12-23 DIAGNOSIS — M791 Myalgia, unspecified site: Secondary | ICD-10-CM | POA: Diagnosis not present

## 2021-12-23 DIAGNOSIS — J189 Pneumonia, unspecified organism: Secondary | ICD-10-CM | POA: Diagnosis not present

## 2021-12-23 DIAGNOSIS — Z888 Allergy status to other drugs, medicaments and biological substances status: Secondary | ICD-10-CM | POA: Diagnosis not present

## 2021-12-23 DIAGNOSIS — Z885 Allergy status to narcotic agent status: Secondary | ICD-10-CM | POA: Diagnosis not present

## 2021-12-23 DIAGNOSIS — R9431 Abnormal electrocardiogram [ECG] [EKG]: Secondary | ICD-10-CM | POA: Diagnosis not present

## 2021-12-23 DIAGNOSIS — R079 Chest pain, unspecified: Secondary | ICD-10-CM | POA: Diagnosis not present

## 2021-12-23 DIAGNOSIS — Z7982 Long term (current) use of aspirin: Secondary | ICD-10-CM | POA: Diagnosis not present

## 2021-12-23 DIAGNOSIS — R6883 Chills (without fever): Secondary | ICD-10-CM | POA: Diagnosis not present

## 2021-12-23 DIAGNOSIS — Z883 Allergy status to other anti-infective agents status: Secondary | ICD-10-CM | POA: Diagnosis not present

## 2021-12-23 DIAGNOSIS — R112 Nausea with vomiting, unspecified: Secondary | ICD-10-CM | POA: Diagnosis not present

## 2021-12-23 DIAGNOSIS — Z79899 Other long term (current) drug therapy: Secondary | ICD-10-CM | POA: Diagnosis not present

## 2021-12-23 DIAGNOSIS — Z882 Allergy status to sulfonamides status: Secondary | ICD-10-CM | POA: Diagnosis not present

## 2021-12-23 DIAGNOSIS — Z9109 Other allergy status, other than to drugs and biological substances: Secondary | ICD-10-CM | POA: Diagnosis not present

## 2021-12-23 DIAGNOSIS — R918 Other nonspecific abnormal finding of lung field: Secondary | ICD-10-CM | POA: Diagnosis not present

## 2021-12-23 DIAGNOSIS — R110 Nausea: Secondary | ICD-10-CM | POA: Diagnosis not present

## 2021-12-23 DIAGNOSIS — R059 Cough, unspecified: Secondary | ICD-10-CM | POA: Diagnosis not present

## 2021-12-23 DIAGNOSIS — J168 Pneumonia due to other specified infectious organisms: Secondary | ICD-10-CM | POA: Diagnosis not present

## 2021-12-30 ENCOUNTER — Ambulatory Visit (INDEPENDENT_AMBULATORY_CARE_PROVIDER_SITE_OTHER): Payer: Medicare Other | Admitting: Family Medicine

## 2021-12-30 ENCOUNTER — Encounter: Payer: Self-pay | Admitting: Family Medicine

## 2021-12-30 VITALS — BP 164/100 | Resp 16 | Ht 62.0 in | Wt 137.0 lb

## 2021-12-30 DIAGNOSIS — J479 Bronchiectasis, uncomplicated: Secondary | ICD-10-CM | POA: Diagnosis not present

## 2021-12-30 DIAGNOSIS — Z1231 Encounter for screening mammogram for malignant neoplasm of breast: Secondary | ICD-10-CM | POA: Diagnosis not present

## 2021-12-30 DIAGNOSIS — R9389 Abnormal findings on diagnostic imaging of other specified body structures: Secondary | ICD-10-CM | POA: Diagnosis not present

## 2021-12-30 DIAGNOSIS — R030 Elevated blood-pressure reading, without diagnosis of hypertension: Secondary | ICD-10-CM

## 2021-12-30 NOTE — Progress Notes (Signed)
Acute Office Visit  Subjective:     Patient ID: Stephanie Stone, female    DOB: 09-10-1950, 71 y.o.   MRN: [id number]  Chief Complaint  Patient presents with   ED Follow up    Pneumonia Follow up. Patient states she is feeling much better.     HPI Patient is in today for follow-up recent diagnosis of pneumonia.  She was seen at urgent care on June 6 for severe dry cough and body aches.  She was diagnosed with multilobular pneumonia on the right side.  Started with Augmentin and doxycycline.  Also noted to have bronchiectasis on the CT scan which they felt was more like an underlying chronic problem.  I recommended referral to pulmonary.  She then developed nausea and vomiting and went back to the emergency room on June 14.  She was given IV fluids and Zofran.  She is actually feeling tremendously better.  Not quite 100% but almost there.  She says just an occasional cough but it is almost gone.  No shortness of breath whatsoever.  She was concerned about the scarring and bronchiectasis that showed up on the CT scan.  Interestingly several years ago when she worked over at [facility], she had a recurring pulmonary infection she says about every 6 weeks or so she would get sick and then get treated and get better and then gets sick again.  This went on for almost 2 years.  She was finally seen by specialist and treated with a certain course of antibiotics and says she finally got better but since then has had some known scar tissue in both of her lungs.  Says at 1 point they even thought that she might have TB but was evaluated for that and was negative.  She has not had any persistent symptoms such as cough or shortness of breath at baseline.  ROS      Objective:    BP (!) 164/100 (BP Location: Right Arm)   Resp 16   Ht '5\' 2"'$  (1.575 m)   Wt 137 lb (62.1 kg)   BMI 25.06 kg/m    Physical Exam Vitals and nursing note reviewed.  Constitutional:      Appearance: She is well-developed.   HENT:     Head: Normocephalic and atraumatic.  Cardiovascular:     Rate and Rhythm: Normal rate and regular rhythm.     Heart sounds: Normal heart sounds.  Pulmonary:     Effort: Pulmonary effort is normal.     Breath sounds: Wheezing and rhonchi present.  Skin:    General: Skin is warm and dry.  Neurological:     Mental Status: She is alert and oriented to person, place, and time.  Psychiatric:        Behavior: Behavior normal.     No results found for any visits on 12/30/21.      Assessment & Plan:   Problem List Items Addressed This Visit       Respiratory   Bronchiectasis ([place])    Noted on CT scan performed June 2023.  Prior history of severe and recurrent infections for about 2 years prior to this.  Also noted is some apical scarring.  We discussed getting an up-to-date either spirometry or PFTs.  So we can better determine how well her lungs are functioning.  Fortunately at baseline she is asymptomatic she is not having increased shortness of breath cough or sputum production which is very reassuring.  She does  have some coarse breath sounds as well as the occasional small wheeze or squeak.  This does put her at higher risk for having recurring infections.  Could consider pulmonology consultation if needed.      Relevant Orders   Pulmonary function test     Other   ELEVATED BLOOD PRESSURE WITHOUT DIAGNOSIS OF HYPERTENSION    I did ask her to start checking her blood pressures at home again.  Blood pressure is actually pretty high here today.      Other Visit Diagnoses     Encounter for screening mammogram for malignant neoplasm of breast    -  Primary   Relevant Orders   MM 3D SCREEN BREAST BILATERAL   Abnormal chest CT       Relevant Orders   Pulmonary function test       No orders of the defined types were placed in this encounter.   Return in about 1 month (around 01/29/2022) for Prevnar 20 vaccine.  Beatrice Lecher, MD

## 2021-12-30 NOTE — Assessment & Plan Note (Signed)
I did ask her to start checking her blood pressures at home again.  Blood pressure is actually pretty high here today.

## 2021-12-30 NOTE — Assessment & Plan Note (Signed)
Noted on CT scan performed June 2023.  Prior history of severe and recurrent infections for about 2 years prior to this.  Also noted is some apical scarring.  We discussed getting an up-to-date either spirometry or PFTs.  So we can better determine how well her lungs are functioning.  Fortunately at baseline she is asymptomatic she is not having increased shortness of breath cough or sputum production which is very reassuring.  She does have some coarse breath sounds as well as the occasional small wheeze or squeak.  This does put her at higher risk for having recurring infections.  Could consider pulmonology consultation if needed.

## 2022-01-08 DIAGNOSIS — E7800 Pure hypercholesterolemia, unspecified: Secondary | ICD-10-CM

## 2022-01-20 ENCOUNTER — Ambulatory Visit (INDEPENDENT_AMBULATORY_CARE_PROVIDER_SITE_OTHER): Payer: Medicare Other

## 2022-01-20 ENCOUNTER — Ambulatory Visit (INDEPENDENT_AMBULATORY_CARE_PROVIDER_SITE_OTHER): Payer: Medicare Other | Admitting: Family Medicine

## 2022-01-20 ENCOUNTER — Encounter: Payer: Self-pay | Admitting: Family Medicine

## 2022-01-20 VITALS — BP 149/90 | HR 90 | Temp 98.1°F | Ht 62.0 in | Wt 137.0 lb

## 2022-01-20 DIAGNOSIS — R509 Fever, unspecified: Secondary | ICD-10-CM

## 2022-01-20 DIAGNOSIS — J479 Bronchiectasis, uncomplicated: Secondary | ICD-10-CM | POA: Diagnosis not present

## 2022-01-20 DIAGNOSIS — R053 Chronic cough: Secondary | ICD-10-CM

## 2022-01-20 DIAGNOSIS — R059 Cough, unspecified: Secondary | ICD-10-CM | POA: Diagnosis not present

## 2022-01-20 DIAGNOSIS — I10 Essential (primary) hypertension: Secondary | ICD-10-CM

## 2022-01-20 DIAGNOSIS — R5383 Other fatigue: Secondary | ICD-10-CM | POA: Diagnosis not present

## 2022-01-20 DIAGNOSIS — Z23 Encounter for immunization: Secondary | ICD-10-CM | POA: Diagnosis not present

## 2022-01-20 MED ORDER — LOSARTAN POTASSIUM 25 MG PO TABS: 25.0000 mg | ORAL_TABLET | Freq: Every day | ORAL | 0 refills | Status: DC

## 2022-01-20 NOTE — Progress Notes (Signed)
Acute Office Visit  Subjective:     Patient ID: Stephanie Stone, female    DOB: 03/06/1951, 71 y.o.   MRN: [id number]  Chief Complaint  Patient presents with   Hypertension    HPI Patient is in today for Elevated BPs and not feeling well.  She says that after I saw her last time she did get significantly better but says her cough never completely resolved.  She had a couple days where she felt pretty fatigued but then last Thursday she started having pretty severe body aches from the lower half of her body down.  And then started having a low-grade fever that lasted until about Monday so for approximately 5 days total.  The fever seems to have just defervesced and the body aches are better but she says she just does not "feel good" she has no energy she just feels really tired and rundown and then this morning she woke up with some tenderness on the right side of her neck.  Also has also been tracking her blood pressures at home and they have been running between 140 and 180.  She says that occasionally she has had a headache especially if her blood pressure is particularly high.  She is not currently on medication.  Hasn't started statin yet.   ROS      Objective:    BP (!) 149/90   Pulse 90   Temp 98.1 F (36.7 C)   Ht _0  (1.575 m)   Wt 137 lb (62.1 kg)   SpO2 100%   BMI 25.06 kg/m    Physical Exam Constitutional:      Appearance: She is well-developed. She is ill-appearing.  HENT:     Head: Normocephalic and atraumatic.     Right Ear: Tympanic membrane, ear canal and external ear normal.     Left Ear: Tympanic membrane, ear canal and external ear normal.     Nose: Nose normal.     Mouth/Throat:     Pharynx: Oropharynx is clear.  Eyes:     Conjunctiva/sclera: Conjunctivae normal.     Pupils: Pupils are equal, round, and reactive to light.  Neck:     Thyroid: No thyromegaly.  Cardiovascular:     Rate and Rhythm: Normal rate and regular rhythm.     Heart sounds:  Normal heart sounds.  Pulmonary:     Effort: Pulmonary effort is normal.     Breath sounds: Normal breath sounds. No wheezing.  Musculoskeletal:     Cervical back: Neck supple.  Lymphadenopathy:     Cervical: No cervical adenopathy.  Skin:    General: Skin is warm and dry.  Neurological:     Mental Status: She is alert and oriented to person, place, and time.  Psychiatric:        Mood and Affect: Mood normal.     No results found for any visits on 01/20/22.      Assessment & Plan:   Problem List Items Addressed This Visit       Cardiovascular and Mediastinum   Essential hypertension    New diagnosis.  Blood pressures here and at home confirm diagnosis.  We will go and start losartan 25 mg daily.  Follow-up in 2 weeks.  We will likely need adjustment to medication to better control her pressures.      Relevant Medications   losartan (COZAAR) 25 MG tablet     Respiratory   Bronchiectasis ([place])    They have  not scheduled her for spirometry yet so I reprinted order today so that we can get her scheduled its been 3 weeks. Also can go ahead and place pulmonology referral so we can go ahead and get that rolling. Prevnar 20 given today. We will get plain film chest x-ray since she has increasing cough and had fever for 5 days.  She had multifocal pneumonia about 2 months ago. Consider other potential causes of her current illness such as MAC.      Other Visit Diagnoses     Other fatigue    -  Primary   Relevant Medications   losartan (COZAAR) 25 MG tablet   Other Relevant Orders   CBC with Differential/Platelet   Sedimentation rate   C-reactive protein   DG Chest 2 View   Ambulatory referral to Pulmonology   Chronic cough       Relevant Medications   losartan (COZAAR) 25 MG tablet   Other Relevant Orders   CBC with Differential/Platelet   Sedimentation rate   C-reactive protein   DG Chest 2 View   Ambulatory referral to Pulmonology   Fever, unspecified fever  cause       Relevant Medications   losartan (COZAAR) 25 MG tablet   Other Relevant Orders   CBC with Differential/Platelet   Sedimentation rate   C-reactive protein   DG Chest 2 View   Ambulatory referral to Pulmonology   Need for pneumococcal 20-valent conjugate vaccination       Relevant Orders   Pneumococcal conjugate vaccine 20-valent (Prevnar 20) (Completed)      Prevnar 20 given today.   Fatigue /chronic cough - CXR today. Pulm referral.  Labs.  See note above.   Meds ordered this encounter  Medications   losartan (COZAAR) 25 MG tablet    Sig: Take 1 tablet (25 mg total) by mouth daily. For Blood pressure    Dispense:  90 tablet    Refill:  0    Return in about 2 weeks (around 02/03/2022) for BP AND BREATHING.  Beatrice Lecher, MD

## 2022-01-20 NOTE — Assessment & Plan Note (Addendum)
They have not scheduled her for spirometry yet so I reprinted order today so that we can get her scheduled its been 3 weeks. Also can go ahead and place pulmonology referral so we can go ahead and get that rolling. Prevnar 20 given today. We will get plain film chest x-ray since she has increasing cough and had fever for 5 days.  She had multifocal pneumonia about 2 months ago. Consider other potential causes of her current illness such as MAC.

## 2022-01-20 NOTE — Assessment & Plan Note (Signed)
New diagnosis.  Blood pressures here and at home confirm diagnosis.  We will go and start losartan 25 mg daily.  Follow-up in 2 weeks.  We will likely need adjustment to medication to better control her pressures.

## 2022-01-21 ENCOUNTER — Encounter: Payer: Self-pay | Admitting: Family Medicine

## 2022-01-21 LAB — CBC WITH DIFFERENTIAL/PLATELET
Absolute Monocytes: 626 cells/uL (ref 200–950)
Basophils Absolute: 52 cells/uL (ref 0–200)
Basophils Relative: 0.9 %
Eosinophils Absolute: 249 cells/uL (ref 15–500)
Eosinophils Relative: 4.3 %
HCT: 40.9 % (ref 35.0–45.0)
Hemoglobin: 14.1 g/dL (ref 11.7–15.5)
Lymphs Abs: 1549 cells/uL (ref 850–3900)
MCH: 30.9 pg (ref 27.0–33.0)
MCHC: 34.5 g/dL (ref 32.0–36.0)
MCV: 89.7 fL (ref 80.0–100.0)
MPV: 11.8 fL (ref 7.5–12.5)
Monocytes Relative: 10.8 %
Neutro Abs: 3323 cells/uL (ref 1500–7800)
Neutrophils Relative %: 57.3 %
Platelets: 293 10*3/uL (ref 140–400)
RBC: 4.56 10*6/uL (ref 3.80–5.10)
RDW: 12.9 % (ref 11.0–15.0)
Total Lymphocyte: 26.7 %
WBC: 5.8 10*3/uL (ref 3.8–10.8)

## 2022-01-21 LAB — C-REACTIVE PROTEIN: CRP: 18.7 mg/L — ABNORMAL HIGH (ref <8.0)

## 2022-01-21 LAB — SEDIMENTATION RATE: Sed Rate: 31 mm/h — ABNORMAL HIGH (ref 0–30)

## 2022-01-21 NOTE — Progress Notes (Signed)
Hi Stephanie Stone, sed rate just borderline elevated not in a significant range that would be consistent with any type of autoimmune disorder.  I did check a CRP as well which can be another form of inflammation it can be a little bit more cardiac related.  Your blood count looks good no elevated white blood cell level which is also reassuring.  I did go ahead and place referral to pulmonology.  Chest x-ray results have not come back in yet but I am hoping they will be back before lunch today.  I also still like to consider repeating an echocardiogram if that something you be okay with then please let me know.

## 2022-01-22 ENCOUNTER — Encounter: Payer: Self-pay | Admitting: Family Medicine

## 2022-01-22 MED ORDER — LEVOFLOXACIN 750 MG PO TABS: 750.0000 mg | ORAL_TABLET | Freq: Every day | ORAL | 0 refills | Status: DC

## 2022-01-22 MED ORDER — ONDANSETRON HCL 4 MG PO TABS: 4.0000 mg | ORAL_TABLET | Freq: Three times a day (TID) | ORAL | 0 refills | Status: DC | PRN

## 2022-01-22 NOTE — Telephone Encounter (Signed)
Patient is requesting to be seen at [facility] in [place]

## 2022-01-22 NOTE — Progress Notes (Signed)
Hi Navdeep, I am can go ahead and send over flora quinolone antibiotic for you.  Let me know if they do not contact you for the pulmonary function testing and the pulmonary referral by middle of next week.  Orders Placed This Encounter     levofloxacin (LEVAQUIN) 750 MG tablet         Sig: Take 1 tablet (750 mg total) by mouth daily.         Dispense:  5 tablet         Refill:  0

## 2022-01-29 ENCOUNTER — Ambulatory Visit: Payer: Medicare Other

## 2022-02-03 ENCOUNTER — Encounter: Payer: Medicare Other | Admitting: Family Medicine

## 2022-02-03 ENCOUNTER — Encounter: Payer: Self-pay | Admitting: Family Medicine

## 2022-02-03 ENCOUNTER — Ambulatory Visit (INDEPENDENT_AMBULATORY_CARE_PROVIDER_SITE_OTHER): Payer: Medicare Other | Admitting: Family Medicine

## 2022-02-03 VITALS — BP 128/70 | HR 65 | Ht 62.0 in | Wt 137.0 lb

## 2022-02-03 DIAGNOSIS — I10 Essential (primary) hypertension: Secondary | ICD-10-CM | POA: Diagnosis not present

## 2022-02-03 DIAGNOSIS — I341 Nonrheumatic mitral (valve) prolapse: Secondary | ICD-10-CM

## 2022-02-03 DIAGNOSIS — J479 Bronchiectasis, uncomplicated: Secondary | ICD-10-CM

## 2022-02-03 NOTE — Assessment & Plan Note (Signed)
We had tried to get PFTs scheduled.  But she does have an upcoming appointment with pulmonology on the eighth.

## 2022-02-03 NOTE — Progress Notes (Signed)
Established Patient Office Visit  Subjective   Patient ID: Stephanie Stone, female    DOB: 1950/10/21  Age: 71 y.o. MRN: [id number]  Chief Complaint  Patient presents with   Hypertension    HPI  Here today for 2-week follow-up for elevated blood pressures and continued upper respiratory infection.  Decided to treat her with another round of antibiotics, Levaquin.  Also placed a referral to [facility] chest.  She has an appt coming up with Dr. Tilden Dome.  In regards to her PNA  she is feeling significantly better after a treatment with Levaquin.  She does have her appointment coming up on August 8.  She says yesterday she got the EKG results from her ED visit show up in her MyChart from her visit 2 weeks ago on June 14.  She brought a copy in because she was concerned because the EKG said abnormal.  It showed normal sinus rhythm but could not rule out anterior infarct, age undetermined.  She reports that several years ago while she was still working she had a full cardiac work-up after experiencing some chest pain.  So brought in home blood pressure logs.  Blood pressures honestly look fantastic in the 120s and a few in the low 130s.  She did have 1 reading at 146/81 with the highest being 150/85 but the majority of them look beautiful.  Feels like her blood pressures been a little better regulated.  Looks like she is did see Dr. Darlin Coco back in 2012.  She did have a stress test done then.  Pictures of Myoview scanned in.  We do have an EKG on file from 2017 but I do not have a current copy of EKG.  We did discuss that we could always repeat an EKG for comparison      ROS    Objective:     BP 128/70   Pulse 65   Ht '5\' 2"'$  (1.575 m)   Wt 137 lb (62.1 kg)   SpO2 98%   BMI 25.06 kg/m     Physical Exam Vitals and nursing note reviewed.  Constitutional:      Appearance: She is well-developed.  HENT:     Head: Normocephalic and atraumatic.  Cardiovascular:     Rate and Rhythm:  Normal rate and regular rhythm.     Heart sounds: Normal heart sounds.  Pulmonary:     Effort: Pulmonary effort is normal.     Breath sounds: Normal breath sounds.  Skin:    General: Skin is warm and dry.  Neurological:     Mental Status: She is alert and oriented to person, place, and time.  Psychiatric:        Behavior: Behavior normal.      No results found for any visits on 02/03/22.     The 10-year ASCVD risk score (Arnett DK, et al., 2019) is: 12.9%    Assessment & Plan:   Problem List Items Addressed This Visit       Cardiovascular and Mediastinum   MVP (mitral valve prolapse)    Go ahead and get updated echocardiogram with recent symptoms chest symptoms etc.      Relevant Orders   ECHOCARDIOGRAM COMPLETE   Essential hypertension - Primary    Pressure here and at home look great.  So working to continue with current regimen with no changes today.        Respiratory   Bronchiectasis ([place])    We had tried to get  PFTs scheduled.  But she does have an upcoming appointment with pulmonology on the eighth.       Return if symptoms worsen or fail to improve.  I spent 30 minutes on the day of the encounter to include pre-visit record review, face-to-face time with the patient and post visit ordering of test.   Beatrice Lecher, MD

## 2022-02-03 NOTE — Assessment & Plan Note (Signed)
Pressure here and at home look great.  So working to continue with current regimen with no changes today.

## 2022-02-03 NOTE — Assessment & Plan Note (Signed)
Go ahead and get updated echocardiogram with recent symptoms chest symptoms etc.

## 2022-02-16 DIAGNOSIS — J988 Other specified respiratory disorders: Secondary | ICD-10-CM | POA: Diagnosis not present

## 2022-02-16 DIAGNOSIS — J479 Bronchiectasis, uncomplicated: Secondary | ICD-10-CM | POA: Diagnosis not present

## 2022-02-23 ENCOUNTER — Ambulatory Visit (HOSPITAL_BASED_OUTPATIENT_CLINIC_OR_DEPARTMENT_OTHER): Payer: Medicare Other

## 2022-03-01 ENCOUNTER — Ambulatory Visit (HOSPITAL_BASED_OUTPATIENT_CLINIC_OR_DEPARTMENT_OTHER)
Admission: RE | Admit: 2022-03-01 | Discharge: 2022-03-01 | Disposition: A | Discharge status: Home / Self Care | Payer: Medicare Other | Source: Ambulatory Visit | Attending: Family Medicine | Admitting: Family Medicine

## 2022-03-01 DIAGNOSIS — I341 Nonrheumatic mitral (valve) prolapse: Secondary | ICD-10-CM | POA: Insufficient documentation

## 2022-03-01 DIAGNOSIS — R079 Chest pain, unspecified: Secondary | ICD-10-CM | POA: Diagnosis not present

## 2022-03-01 DIAGNOSIS — I340 Nonrheumatic mitral (valve) insufficiency: Secondary | ICD-10-CM

## 2022-03-01 LAB — ECHOCARDIOGRAM COMPLETE
AR max vel: 1.42 cm2
AV Area VTI: 1.34 cm2
AV Area mean vel: 1.38 cm2
AV Mean grad: 5 mmHg
AV Peak grad: 9.7 mmHg
Ao pk vel: 1.56 m/s
Area-P 1/2: 3.72 cm2
S' Lateral: 2.6 cm

## 2022-03-01 NOTE — Progress Notes (Signed)
  Echocardiogram 2D Echocardiogram has been performed.  Merrie Roof F 03/01/2022, 11:22 AM

## 2022-03-01 NOTE — Progress Notes (Signed)
Hi Stephanie Stone, I think function of the heart looks good between 60 and 65%.  The motion of the walls is normal.  There is a little bit of backflow on the mitral valve but nothing in a worrisome range.  Is also a little bit of backflow on the aortic valve also not in a worrisome range.  Overall the heart looks good.  He did not see any significant prolapse of the mitral valve.  Felt like the overall structure was reassuring.

## 2022-03-02 ENCOUNTER — Ambulatory Visit (INDEPENDENT_AMBULATORY_CARE_PROVIDER_SITE_OTHER): Payer: Medicare Other | Admitting: Family Medicine

## 2022-03-02 ENCOUNTER — Encounter: Payer: Self-pay | Admitting: Family Medicine

## 2022-03-02 VITALS — BP 136/75 | HR 85 | Ht 61.5 in | Wt 139.0 lb

## 2022-03-02 DIAGNOSIS — I10 Essential (primary) hypertension: Secondary | ICD-10-CM

## 2022-03-02 DIAGNOSIS — Z0000 Encounter for general adult medical examination without abnormal findings: Secondary | ICD-10-CM | POA: Diagnosis not present

## 2022-03-02 DIAGNOSIS — R7989 Other specified abnormal findings of blood chemistry: Secondary | ICD-10-CM

## 2022-03-02 NOTE — Progress Notes (Signed)
Complete physical exam  Patient: Stephanie Stone   DOB: 08/23/1950   71 y.o. Female  MRN: [id number]  Subjective:    Chief Complaint  Patient presents with   Annual Exam    Stephanie Stone is a 71 y.o. female who presents today for a complete physical exam. She reports consuming a general diet.  She generally feels well. . She does have additional problems to discuss today.   She did want to mention to me a specific concern.  She has been having an intermittent "deep" pain in her right upper back between the shoulder blade and the spine.  She says it does not seem to be worse with a specific motion twisting or bending.  Though it does seem to be worse in general if she has been up and very active.  Coughing or sneezing does not make it worse.  She says it actually started around December 2022 so probably about 8 or 9 months ago.  When it hurts she will usually take 3 Tylenol and 2 ibuprofen and that typically does give her relief.  She says she has to take medication for it at least 3-4 times a week.  She denies any GI symptoms such as nausea abdominal pain or changes in bowel movements with the discomfort.  She denies any shortness of breath and she denies any anterior chest pain.  She did follow-up with the pulmonologist, Dr. Luanna Salk for her bronchiectasis and recurrent respiratory infections that.  They are going to see her back in about 6 months.   Most recent fall risk assessment:    03/02/2022   11:08 AM  [place] in the past year? 0  Number falls in past yr: 0  Injury with Fall? 0  Risk for fall due to : No Fall Risks  Follow up Falls evaluation completed     Most recent depression screenings:    03/02/2022   11:08 AM 12/30/2021    2:19 PM  PHQ 2/9 Scores  PHQ - 2 Score 0 0        Patient Care Team: [name], MD as PCP - General (Family Medicine) Darius Bump, [facility] as Pharmacist (Pharmacist)   Outpatient Medications Prior to Visit  Medication Sig    aspirin 81 MG EC tablet Take 81 mg by mouth daily.   Biotin 1000 MCG tablet Take 1,000 mcg by mouth daily.   cholecalciferol (VITAMIN D3) 25 MCG (1000 UNIT) tablet Take 1,000 Units by mouth daily.   eletriptan (RELPAX) 20 MG tablet Take 1 tablet (20 mg total) by mouth as needed. may repeat in 2 hours if necessary   fluocinonide (LIDEX) 0.05 % external solution Apply 1 application topically at bedtime.   fluticasone (FLONASE) 50 MCG/ACT nasal spray SHAKE LIQUID AND USE 2 SPRAYS IN EACH NOSTRIL DAILY   gabapentin (NEURONTIN) 100 MG capsule Take 1 capsule (100 mg total) by mouth 3 (three) times daily.   losartan (COZAAR) 25 MG tablet Take 1 tablet (25 mg total) by mouth daily. For Blood pressure   MINOXIDIL, TOPICAL, 5 % SOLN Apply 1 application topically at bedtime.   vitamin E 200 UNIT capsule Take 200 Units by mouth daily.   Zinc 22.5 MG TABS Take by mouth daily.   rosuvastatin (CRESTOR) 5 MG tablet Take 1 tablet (5 mg total) by mouth at bedtime as needed. (Patient not taking: Reported on 03/02/2022)   No facility-administered medications prior to visit.  ROS        Objective:     BP 136/75   Pulse 85   Ht 5' 1.5" (1.562 m)   Wt 139 lb (63 kg)   SpO2 97%   BMI 25.84 kg/m    Physical Exam   No results found for any visits on 03/02/22.     Assessment & Plan:    Routine Health Maintenance and Physical Exam  Immunization History  Administered Date(s) Administered   Fluad Quad(high Dose 65+) 05/07/2019, 05/07/2020, 05/19/2021   Influenza, High Dose Seasonal PF 05/02/2017   Influenza,inj,Quad PF,6+ Mos 04/20/2013, 05/27/2016, 05/17/2018   Influenza-Unspecified 04/03/2014   PFIZER Comirnaty(Gray Top)Covid-19 Tri-Sucrose Vaccine 10/29/2020   PFIZER(Purple Top)SARS-COV-2 Vaccination 08/17/2019, 09/10/2019   PNEUMOCOCCAL CONJUGATE-20 01/20/2022   Pneumococcal Conjugate-13 05/02/2017   Pneumococcal Polysaccharide-23 05/17/2018   Td 07/13/2003   Tdap 12/12/2015     Health Maintenance  Topic Date Due   COVID-19 Vaccine (4 - Pfizer series) 12/24/2020   MAMMOGRAM  08/15/2021   INFLUENZA VACCINE  02/09/2022   Zoster Vaccines- Shingrix (1 of 2) 04/01/2022 (Originally 04/27/2001)   Fecal DNA (Cologuard)  12/16/2023   TETANUS/TDAP  12/11/2025   Pneumonia Vaccine 42+ Years old  Completed   DEXA SCAN  Completed   Hepatitis C Screening  Completed   HPV VACCINES  Aged Out    Discussed health benefits of physical activity, and encouraged her to engage in regular exercise appropriate for her age and condition.  Problem List Items Addressed This Visit       Cardiovascular and Mediastinum   Essential hypertension   Relevant Orders   Lipid Panel w/reflex Direct LDL   COMPLETE METABOLIC PANEL WITH GFR   CBC   TSH   Other Visit Diagnoses     Wellness examination    -  Primary   Abnormal TSH       Relevant Orders   Lipid Panel w/reflex Direct LDL   COMPLETE METABOLIC PANEL WITH GFR   CBC   TSH       Keep up a regular exercise program and make sure you are eating a healthy diet Try to eat 4 servings of dairy a day, or if you are lactose intolerant take a calcium with vitamin D daily.  Your vaccines are up to date. GEt flu vaccine thi sfall.   Right upper back pain-unclear etiology.  I do not think it is related to the bronchiectasis.  She does not experience any GI symptoms with the discomfort so gallbladder is less likely though she still has her gallbladder. Recent Echo normal. CT of chest showed some mild atherosclerosis of aorta and coronaries. No pericardial effusion. No abnormal LNs. No chest wall mass. Nothing specific to explain the pain. Could be radiculopathy from the spine.  She does get relief with NSAids.  Consider xray of thoracic spine.   No follow-ups on file.     Beatrice Lecher, MD

## 2022-03-08 ENCOUNTER — Ambulatory Visit (HOSPITAL_BASED_OUTPATIENT_CLINIC_OR_DEPARTMENT_OTHER)
Admission: RE | Admit: 2022-03-08 | Discharge: 2022-03-08 | Disposition: A | Discharge status: Home / Self Care | Payer: Medicare Other | Source: Ambulatory Visit | Attending: Family Medicine | Admitting: Family Medicine

## 2022-03-08 ENCOUNTER — Encounter (HOSPITAL_BASED_OUTPATIENT_CLINIC_OR_DEPARTMENT_OTHER): Payer: Self-pay

## 2022-03-08 DIAGNOSIS — Z1231 Encounter for screening mammogram for malignant neoplasm of breast: Secondary | ICD-10-CM | POA: Insufficient documentation

## 2022-03-10 NOTE — Progress Notes (Signed)
Please call patient. Normal mammogram.  Repeat in 1 year.  

## 2022-03-30 ENCOUNTER — Ambulatory Visit (INDEPENDENT_AMBULATORY_CARE_PROVIDER_SITE_OTHER): Payer: Medicare Other | Admitting: Pharmacist

## 2022-03-30 DIAGNOSIS — G43109 Migraine with aura, not intractable, without status migrainosus: Secondary | ICD-10-CM

## 2022-03-30 DIAGNOSIS — E7800 Pure hypercholesterolemia, unspecified: Secondary | ICD-10-CM

## 2022-03-30 NOTE — Progress Notes (Signed)
Chronic Care Management Pharmacy Note  03/30/2022 Name:  Stephanie Stone MRN:  [id number] DOB:  [birth year]  Summary: addressed hyperlipidemia, chronic pain/headache.  She filled her new rosuvastatin but has not started yet.   Recommendations/Changes made from today's visit: continue lifestyle modifications, reminded patient to obtain labs (already ordered), initiate statin.  Plan: f/u with pharmacist in 3 months  Subjective: Stephanie Stone is an 71 y.o. year old female who is a primary patient of [name], Rene Kocher, MD.  The CCM team was consulted for assistance with disease management and care coordination needs.    Engaged with patient by telephone for follow up visit in response to provider referral for pharmacy case management and/or care coordination services.   Consent to Services:  The patient was given information about Chronic Care Management services, agreed to services, and gave verbal consent prior to initiation of services.  Please see initial visit note for detailed documentation.   Patient Care Team: [name], MD as PCP - General (Family Medicine) Darius Bump, [facility] as Pharmacist (Pharmacist)   Objective:  Lab Results  Component Value Date   CREATININE 0.81 05/19/2021   CREATININE 0.91 03/09/2021   CREATININE 0.87 11/11/2020       Component Value Date/Time   CHOL 217 (H) 11/11/2020 0818   TRIG 77 11/11/2020 0818   TRIG 117 12/06/2008 0000   HDL 56 11/11/2020 0818   CHOLHDL 3.9 11/11/2020 0818   VLDL 19 09/23/2016 0849   LDLCALC 143 (H) 11/11/2020 0818       Latest Ref Rng & Units 11/11/2020    8:18 AM 09/23/2016    8:49 AM 10/07/2014    8:27 AM  Hepatic Function  Total Protein 6.1 - 8.1 g/dL 6.6  6.2  6.9   Albumin 3.6 - 5.1 g/dL  4.1  4.7   AST 10 - 35 U/L _0 ALT 6 - 29 U/L _1 Alk Phosphatase 33 - 130 U/L  86  100   Total Bilirubin 0.2 - 1.2 mg/dL 0.5  0.5  0.4     Lab Results  Component Value Date/Time   TSH 4.60  (H) 11/11/2020 08:18 AM   TSH 4.94 04/27/2013 12:00 AM   TSH 3.804 08/06/2010 06:02 PM       Latest Ref Rng & Units 01/20/2022   12:00 AM 03/09/2021   12:00 AM 11/11/2020    8:18 AM  CBC  WBC 3.8 - 10.8 Thousand/uL 5.8  6.4  5.9   Hemoglobin 11.7 - 15.5 g/dL 14.1  14.9  15.8   Hematocrit 35.0 - 45.0 % 40.9  46.0  47.5   Platelets 140 - 400 Thousand/uL 293  230  226     Clinical ASCVD: The 10-year ASCVD risk score (Arnett DK, et al., 2019) is: 14.4%   Values used to calculate the score:     Age: 60 years     Sex: Female     Is Non-Hispanic African American: No     Diabetic: No     Tobacco smoker: No     Systolic Blood Pressure: 616 mmHg     Is BP treated: Yes     HDL Cholesterol: 56 mg/dL     Total Cholesterol: 217 mg/dL     Social History   Tobacco Use  Smoking Status Never  Smokeless Tobacco Never   BP Readings from Last 3 Encounters:  03/02/22 136/75  02/03/22  128/70  01/20/22 (!) 149/90   Pulse Readings from Last 3 Encounters:  03/02/22 85  02/03/22 65  01/20/22 90   Wt Readings from Last 3 Encounters:  03/02/22 139 lb (63 kg)  02/03/22 137 lb (62.1 kg)  01/20/22 137 lb (62.1 kg)    Assessment: Review of patient past medical history, allergies, medications, health status, including review of consultants reports, laboratory and other test data, was performed as part of comprehensive evaluation and provision of chronic care management services.   SDOH:  (Social Determinants of Health) assessments and interventions performed:  SDOH Interventions    Flowsheet Row Office Visit from 05/22/2021 in [place] Office Visit from 11/09/2017 in [place] Interventions Intervention Not Indicated --  Housing Interventions Intervention Not Indicated --  Transportation Interventions Intervention Not Indicated --  Depression Interventions/Treatment  -- Currently  on Treatment  Financial Strain Interventions Intervention Not Indicated --  Physical Activity Interventions Intervention Not Indicated --  Stress Interventions Intervention Not Indicated --  Social Connections Interventions Intervention Not Indicated --       CCM Care Plan  Allergies  Allergen Reactions   Sulfa Antibiotics     Other reaction(s): Unknown   Azithromycin Nausea Only   Codeine     REACTION: NAUSEA   Elemental Sulfur    Lovastatin    Miconazole Nitrate     REACTION: Nausea    Medications Reviewed Today     Reviewed by Narda Rutherford, CMA (Certified Medical Assistant) on 03/02/22 at [place] List Status: <None>   Medication Order Taking? Sig Documenting Provider Last Dose Status Informant  aspirin 81 MG EC tablet [id number] Yes Take 81 mg by mouth daily. [provider] Taking Active   Biotin 1000 MCG tablet [id number] Yes Take 1,000 mcg by mouth daily. [provider] Taking Active   cholecalciferol (VITAMIN D3) 25 MCG (1000 UNIT) tablet [id number] Yes Take 1,000 Units by mouth daily. [provider] Taking Active   eletriptan (RELPAX) 20 MG tablet [id number] Yes Take 1 tablet (20 mg total) by mouth as needed. may repeat in 2 hours if necessary [name], MD Taking Active   fluocinonide (LIDEX) 0.05 % external solution [id number] Yes Apply 1 application topically at bedtime. [provider] Taking Active   fluticasone (FLONASE) 50 MCG/ACT nasal spray [id number] Yes SHAKE LIQUID AND USE 2 SPRAYS IN EACH NOSTRIL DAILY [name], MD Taking Active   gabapentin (NEURONTIN) 100 MG capsule [id number] Yes Take 1 capsule (100 mg total) by mouth 3 (three) times daily. [name], MD Taking Active            Med Note Darius Bump   Fri Dec 11, 2021 10:32 AM) Taking PRN for neuralgia near ear.  losartan (COZAAR) 25 MG tablet [id number] Yes Take 1 tablet (25 mg total) by mouth daily. For Blood pressure [name], MD Taking Active   MINOXIDIL, TOPICAL, 5 % SOLN [id number] Yes Apply 1 application topically at bedtime. [name], MD Taking Active   rosuvastatin (CRESTOR) 5 MG tablet [id number] No Take 1 tablet (5 mg total) by mouth at bedtime as needed.  Patient not taking: Reported on 03/02/2022   [name], MD Not Taking Active            Med Note Madilyn Fireman, Monia Sabal Mar 02, 2022  7:38 AM)    vitamin E 200 UNIT capsule [id number] Yes Take 200 Units by mouth daily. [provider] Taking Active   Zinc 22.5 MG TABS [id number] Yes Take by mouth daily. [provider] Taking Active             Patient Active Problem List   Diagnosis Date Noted   Bronchiectasis ([place]) 12/30/2021   Sleep disturbance 05/19/2021   Degenerative disc disease, cervical 08/07/2019   Occipital neuralgia of right side 08/07/2019   Slow transit constipation 10/01/2014   Hyperlipidemia 08/11/2010   DEPRESSION, SITUATIONAL 08/11/2010   Essential hypertension 07/08/2010   ALOPECIA AREATA 05/29/2010   MENOPAUSE, PREMATURE 02/17/2007   Migraine headache 02/17/2007   MVP (mitral valve prolapse) 02/17/2007    Immunization History  Administered Date(s) Administered   Fluad Quad(high Dose 65+) 05/07/2019, 05/07/2020, 05/19/2021   Influenza, High Dose Seasonal PF 05/02/2017   Influenza,inj,Quad PF,6+ Mos 04/20/2013, 05/27/2016, 05/17/2018   Influenza-Unspecified 04/03/2014   PFIZER Comirnaty(Gray Top)Covid-19 Tri-Sucrose Vaccine 10/29/2020   PFIZER(Purple Top)SARS-COV-2 Vaccination 08/17/2019, 09/10/2019   PNEUMOCOCCAL CONJUGATE-20 01/20/2022   Pneumococcal Conjugate-13 05/02/2017   Pneumococcal Polysaccharide-23 05/17/2018   Td 07/13/2003   Tdap 12/12/2015    Conditions to be addressed/monitored: HLD and chronic pain  There are no care plans that you recently modified to display for this patient.    Medication Assistance: None required.  Patient affirms  current coverage meets needs.  Patient's preferred pharmacy is:  [id number] - [place], [place] - [address] AT [place] [address] [place] [address] Phone: [phone] Fax: [phone]   Uses pill box? No - not taking many medications, current system doing well for her Pt endorses 100% compliance  Follow Up:  Patient agrees to Care Plan and Follow-up.  Plan: Telephone follow up appointment with care management team member scheduled for:  3 months  Larinda Buttery, PharmD Clinical Pharmacist [facility] [phone]

## 2022-04-12 ENCOUNTER — Ambulatory Visit
Admission: EM | Admit: 2022-04-12 | Discharge: 2022-04-12 | Disposition: A | Discharge status: Home / Self Care | Payer: Medicare Other | Attending: Physician Assistant | Admitting: Physician Assistant

## 2022-04-12 ENCOUNTER — Ambulatory Visit: Payer: Medicare Other

## 2022-04-12 DIAGNOSIS — N3001 Acute cystitis with hematuria: Secondary | ICD-10-CM

## 2022-04-12 DIAGNOSIS — Z87440 Personal history of urinary (tract) infections: Secondary | ICD-10-CM | POA: Insufficient documentation

## 2022-04-12 DIAGNOSIS — R5383 Other fatigue: Secondary | ICD-10-CM | POA: Insufficient documentation

## 2022-04-12 DIAGNOSIS — R300 Dysuria: Secondary | ICD-10-CM | POA: Diagnosis present

## 2022-04-12 LAB — POCT URINALYSIS DIP (MANUAL ENTRY)
Bilirubin, UA: NEGATIVE
Glucose, UA: 100 mg/dL — AB
Ketones, POC UA: NEGATIVE mg/dL
Nitrite, UA: POSITIVE — AB
Protein Ur, POC: NEGATIVE mg/dL
Spec Grav, UA: <=1.005 — AB (ref 1.010–1.025)
Urobilinogen, UA: 1 E.U./dL
pH, UA: 5 (ref 5.0–8.0)

## 2022-04-12 MED ORDER — ONDANSETRON 4 MG PO TBDP: 4.0000 mg | ORAL_TABLET | Freq: Three times a day (TID) | ORAL | 0 refills | Status: DC | PRN

## 2022-04-12 MED ORDER — CEPHALEXIN 500 MG PO CAPS: 500.0000 mg | ORAL_CAPSULE | Freq: Four times a day (QID) | ORAL | 0 refills | Status: DC

## 2022-04-12 NOTE — ED Provider Notes (Signed)
[name]    CSN: [id number] Arrival date & time: 04/12/22  4098      History   Chief Complaint Chief Complaint  Patient presents with   Dysuria    HPI Stephanie Stone is a 71 y.o. female.   Patient presents today with a several hour history of UTI symptoms.  Reports she was feeling poorly including more fatigue and some body aches over the past several days but then woke up at 2 AM this morning with urinary frequency, urinary urgency, dysuria.  Denies any abdominal pain, fever, nausea, vomiting, hematuria, pelvic pain, vaginal symptoms.  Does have a history of UTI with similar symptoms.  Reports last antibiotic use was Levaquin in July.  Denies history of nephrolithiasis, seeing a urologist in the past, single kidney, recent urogenital procedure, catheterization.  She has taken Azo with temporary relief of symptoms.  She is requesting a prescription for Zofran as she often has nausea/vomiting associated with antibiotics.  She denies history of diabetes and does not take SGLT2 inhibitor.    Past Medical History:  Diagnosis Date   Allergy    Alopecia    areata   Anxiety    situational   Depression    Hyperlipidemia    Hypertension    Lung infiltrate 07/12/2008   Resolved on its own without biopsy   Menopause, premature    Migraines    MVP (mitral valve prolapse)     Patient Active Problem List   Diagnosis Date Noted   Bronchiectasis ([place]) 12/30/2021   Sleep disturbance 05/19/2021   Degenerative disc disease, cervical 08/07/2019   Occipital neuralgia of right side 08/07/2019   Slow transit constipation 10/01/2014   Hyperlipidemia 08/11/2010   DEPRESSION, SITUATIONAL 08/11/2010   Essential hypertension 07/08/2010   ALOPECIA AREATA 05/29/2010   MENOPAUSE, PREMATURE 02/17/2007   Migraine headache 02/17/2007   MVP (mitral valve prolapse) 02/17/2007    Past Surgical History:  Procedure Laterality Date   ABDOMINAL HYSTERECTOMY  07/12/1977   endometriosis    APPENDECTOMY      OB History   No obstetric history on file.      Home Medications    Prior to Admission medications   Medication Sig Start Date End Date Taking? Authorizing Provider  cephALEXin (KEFLEX) 500 MG capsule Take 1 capsule (500 mg total) by mouth 4 (four) times daily. 04/12/22  Yes Cullan Launer K, PA-C  ondansetron (ZOFRAN-ODT) 4 MG disintegrating tablet Take 1 tablet (4 mg total) by mouth every 8 (eight) hours as needed for nausea or vomiting. 04/12/22  Yes Ayodeji Keimig, Derry Skill, PA-C  aspirin 81 MG EC tablet Take 81 mg by mouth daily.    [provider]  Biotin 1000 MCG tablet Take 1,000 mcg by mouth daily.    [provider]  cholecalciferol (VITAMIN D3) 25 MCG (1000 UNIT) tablet Take 1,000 Units by mouth daily.    [provider]  eletriptan (RELPAX) 20 MG tablet Take 1 tablet (20 mg total) by mouth as needed. may repeat in 2 hours if necessary 07/31/19   [name], MD  fluocinonide (LIDEX) 0.05 % external solution Apply 1 application topically at bedtime.    [provider]  fluticasone (FLONASE) 50 MCG/ACT nasal spray SHAKE LIQUID AND USE 2 SPRAYS IN EACH NOSTRIL DAILY 07/31/21   [name], MD  gabapentin (NEURONTIN) 100 MG capsule Take 1 capsule (100 mg total) by mouth 3 (three) times daily. 05/25/21   [name], MD  losartan (COZAAR) 25 MG tablet Take 1 tablet (25 mg total) by mouth daily. For Blood pressure 01/20/22   [name], MD  MINOXIDIL, TOPICAL, 5 % SOLN Apply 1 application topically at bedtime. 05/07/19   [name], MD  rosuvastatin (CRESTOR) 5 MG tablet Take 1 tablet (5 mg total) by mouth at bedtime as needed. Patient not taking: Reported on 03/02/2022 06/02/21   [name], MD  vitamin E 200 UNIT capsule Take 200 Units by mouth daily.    [provider]  Zinc 22.5 MG TABS Take by mouth daily.    [provider]    Family History Family History   Problem Relation Age of Onset   Heart attack Mother    Hypertension Mother    Coronary artery disease Mother 5   Liver cancer Mother    Cancer Mother    Heart disease Mother    Heart attack Father    Early death Father    Heart disease Father    Heart attack Brother 30   Stroke Brother    Heart disease Brother     Social History Social History   Tobacco Use   Smoking status: Never   Smokeless tobacco: Never  Vaping Use   Vaping Use: Never used  Substance Use Topics   Alcohol use: Yes    Alcohol/week: 1.0 standard drink of alcohol    Types: 1 Glasses of wine per week    Comment: maybe once a month   Drug use: No     Allergies   Sulfa antibiotics, Azithromycin, Codeine, Elemental sulfur, Lovastatin, and Miconazole nitrate   Review of Systems Review of Systems  Constitutional:  Positive for activity change. Negative for appetite change, fatigue and fever.  Gastrointestinal:  Negative for abdominal pain, diarrhea, nausea and vomiting.  Genitourinary:  Positive for dysuria, frequency and urgency. Negative for flank pain, hematuria, pelvic pain, vaginal bleeding, vaginal discharge and vaginal pain.     Physical Exam Triage Vital Signs ED Triage Vitals  Enc Vitals Group     BP 04/12/22 0828 123/80     Pulse Rate 04/12/22 0828 73     Resp 04/12/22 0828 14     Temp 04/12/22 0828 97.6 F (36.4 C)     Temp Source 04/12/22 0828 Oral     SpO2 04/12/22 0828 99 %     Weight --      Height --      Head Circumference --      Peak Flow --      Pain Score 04/12/22 0827 0     Pain Loc --      Pain Edu? --      Excl. in [place]? --    No data found.  Updated Vital Signs BP 123/80 (BP Location: Right Arm)   Pulse 73   Temp 97.6 F (36.4 C) (Oral)   Resp 14   SpO2 99%   Visual Acuity Right Eye Distance:   Left Eye Distance:   Bilateral Distance:    Right Eye Near:   Left Eye Near:    Bilateral Near:     Physical Exam Vitals reviewed.  Constitutional:       General: She is awake. She is not in acute distress.    Appearance: Normal appearance. She is well-developed. She is not ill-appearing.     Comments: Very pleasant female appears stated age in no acute distress sitting comfortably in exam room  HENT:  Head: Normocephalic and atraumatic.  Cardiovascular:     Rate and Rhythm: Normal rate and regular rhythm.     Heart sounds: Normal heart sounds, S1 normal and S2 normal. No murmur heard. Pulmonary:     Effort: Pulmonary effort is normal.     Breath sounds: Normal breath sounds. No wheezing, rhonchi or rales.     Comments: Clear to auscultation bilaterally Abdominal:     General: Bowel sounds are normal.     Palpations: Abdomen is soft.     Tenderness: There is no abdominal tenderness. There is no right CVA tenderness, left CVA tenderness, guarding or rebound.     Comments: Benign abdominal exam; no tenderness palpation.  Psychiatric:        Behavior: Behavior is cooperative.      UC Treatments / Results  Labs (all labs ordered are listed, but only abnormal results are displayed) Labs Reviewed  POCT URINALYSIS DIP (MANUAL ENTRY) - Abnormal; Notable for the following components:      Result Value   Color, UA orange (*)    Glucose, UA =100 (*)    Spec Grav, UA <=1.005 (*)    Blood, UA trace-intact (*)    Nitrite, UA Positive (*)    Leukocytes, UA Trace (*)    All other components within normal limits  URINE CULTURE    EKG   Radiology No results found.  Procedures Procedures (including critical care time)  Medications Ordered in UC Medications - No data to display  Initial Impression / Assessment and Plan / UC Course  I have reviewed the triage vital signs and the nursing notes.  Pertinent labs & imaging results that were available during my care of the patient were reviewed by me and considered in my medical decision making (see chart for details).     UA was obtained by nursing staff and is unreliable as  patient recently took Azo.  She is well-appearing, afebrile, nontoxic, nontachycardic.  No indication for emergent evaluation or imaging.  Will empirically treat given clinical presentation and history of UTI with similar symptoms.  She was started on cephalexin 500 mg every 6 hours x5 days.  No indication for dose adjustment based on CMP in Care Everywhere from 12/23/2021 with calculated creatinine clearance of 67.67 mL/min.  She was provided Zofran to have on hand as she often has nausea/vomiting associated with antibiotic use.  Recommend that she rest and drink plenty of fluid.  She is to push fluids. Urine culture was obtained and we will contact her if we need to arrange additional treatment based on susceptibilities identified on culture.  Recommended follow-up with her primary care.  Discussed if she has any worsening symptoms she needs to be seen immediately.  Strict return precautions given.  Final Clinical Impressions(s) / UC Diagnoses   Final diagnoses:  Acute cystitis with hematuria     Discharge Instructions      Start cephalexin as prescribed.  Use Zofran as needed for nausea and vomiting.  Take this with food to prevent GI upset.  Make sure you are drinking plenty of fluid.  We will contact you if we need to change your antibiotic based on your culture result.  If you have any worsening or changing symptoms including abdominal pain, fever, nausea/vomiting, weakness, blood in your urine you should be seen immediately.     ED Prescriptions     Medication Sig Dispense Auth. Provider   ondansetron (ZOFRAN-ODT) 4 MG disintegrating tablet Take 1 tablet (4  mg total) by mouth every 8 (eight) hours as needed for nausea or vomiting. 20 tablet Gottlieb Zuercher K, PA-C   cephALEXin (KEFLEX) 500 MG capsule Take 1 capsule (500 mg total) by mouth 4 (four) times daily. 20 capsule Bernice Mullin, Derry Skill, PA-C      PDMP not reviewed this encounter.   Terrilee Croak, PA-C 04/12/22 0848

## 2022-04-12 NOTE — Discharge Instructions (Addendum)
Start cephalexin as prescribed.  Use Zofran as needed for nausea and vomiting.  Take this with food to prevent GI upset.  Make sure you are drinking plenty of fluid.  We will contact you if we need to change your antibiotic based on your culture result.  If you have any worsening or changing symptoms including abdominal pain, fever, nausea/vomiting, weakness, blood in your urine you should be seen immediately.

## 2022-04-12 NOTE — ED Triage Notes (Signed)
Pt presents with c/o polyuria and dysuria that began this morning.

## 2022-04-13 LAB — URINE CULTURE: Culture: <10000 — AB

## 2022-04-22 ENCOUNTER — Ambulatory Visit (INDEPENDENT_AMBULATORY_CARE_PROVIDER_SITE_OTHER): Payer: Medicare Other | Admitting: Family Medicine

## 2022-04-22 DIAGNOSIS — Z23 Encounter for immunization: Secondary | ICD-10-CM

## 2022-05-06 ENCOUNTER — Other Ambulatory Visit: Payer: Self-pay | Admitting: *Deleted

## 2022-05-06 ENCOUNTER — Telehealth: Payer: Self-pay | Admitting: *Deleted

## 2022-05-06 NOTE — Telephone Encounter (Signed)
Pt called and wanted to know if the dosage of her Losartan should be changed her bp (systolic) [phone]/ (diastolic) 16-837 . She is currently taking Losartan 25 mg she said that her husband is a Nurse, adult and mentioned asking about adding a diuretic.   Advised that I would fwd this to Dr. Madilyn Fireman.

## 2022-05-07 MED ORDER — LOSARTAN POTASSIUM-HCTZ 50-12.5 MG PO TABS: 1.0000 | ORAL_TABLET | Freq: Every day | ORAL | 0 refills | Status: DC

## 2022-05-07 NOTE — Telephone Encounter (Signed)
Sent over updated Rx.  F/U in 3 weeks with me on new med.  We should be able to get in 8-10 point drop on this regimen.  Meds ordered this encounter  Medications   losartan-hydrochlorothiazide (HYZAAR) 50-12.5 MG tablet    Sig: Take 1 tablet by mouth daily.    Dispense:  90 tablet    Refill:  0

## 2022-05-07 NOTE — Telephone Encounter (Signed)
Called and informed pt and transferred her to front desk to be scheduled.

## 2022-06-01 ENCOUNTER — Ambulatory Visit (INDEPENDENT_AMBULATORY_CARE_PROVIDER_SITE_OTHER): Payer: Medicare Other | Admitting: Family Medicine

## 2022-06-01 ENCOUNTER — Encounter: Payer: Self-pay | Admitting: Family Medicine

## 2022-06-01 VITALS — BP 114/52 | HR 75 | Ht 61.0 in | Wt 144.1 lb

## 2022-06-01 DIAGNOSIS — R208 Other disturbances of skin sensation: Secondary | ICD-10-CM

## 2022-06-01 DIAGNOSIS — I10 Essential (primary) hypertension: Secondary | ICD-10-CM

## 2022-06-01 DIAGNOSIS — R202 Paresthesia of skin: Secondary | ICD-10-CM

## 2022-06-01 MED ORDER — ENALAPRIL-HYDROCHLOROTHIAZIDE 10-25 MG PO TABS: 1.0000 | ORAL_TABLET | Freq: Every day | ORAL | 0 refills | Status: DC

## 2022-06-01 NOTE — Progress Notes (Signed)
Established Patient Office Visit  Subjective   Patient ID: Stephanie Stone, female    DOB: [birth year]  Age: 71 y.o. MRN: [id number]  Chief Complaint  Patient presents with   Hypertension    HPI  Hypertension- Pt denies chest pain, SOB, dizziness, or heart palpitations.  Taking meds as directed.  She has noticed though that even when she was on the losartan by itself that she was getting burning in her hands and feet and sometimes would feel itchy.  It happens almost every day usually in the evenings.  She doses her medication in the mornings.  It started pretty immediately after starting the new medication.  She says she actually skipped 2 days just to see if she would feel better and she did.  So she is pretty sure it is related to the medication.  She is in is a MyChart note letting us know that her systolic pressures have been elevated in the 140s to 160s.  So we adjusted her blood pressure medication regimen about 3 weeks ago.  Says she is here today for follow-up.      ROS    Objective:     BP (!) 114/52 (BP Location: Left Arm, Patient Position: Sitting, Cuff Size: Normal)   Pulse 75   Ht '5\' 1"'$  (1.549 m)   Wt 144 lb 1.9 oz (65.4 kg)   SpO2 99%   BMI 27.23 kg/m    Physical Exam Vitals and nursing note reviewed.  Constitutional:      Appearance: She is well-developed.  HENT:     Head: Normocephalic and atraumatic.  Cardiovascular:     Rate and Rhythm: Normal rate.  Pulmonary:     Effort: Pulmonary effort is normal.  Skin:    General: Skin is warm and dry.  Neurological:     Mental Status: She is alert and oriented to person, place, and time.  Psychiatric:        Behavior: Behavior normal.      No results found for any visits on 06/01/22.    The 10-year ASCVD risk score (Arnett DK, et al., 2019) is: 11.4%    Assessment & Plan:   Problem List Items Addressed This Visit       Cardiovascular and Mediastinum   Essential hypertension - Primary    Blood  pressure looks phenomenal today but working to make a change because she has been getting itching and burning sensation in her hands and feet.  And it started directly when she first started the losartan.  We will try switching to an ACE inhibitor with a diuretic to see if that is tolerated well if not consider switching categories completely.  Follow-up in 4 weeks for nurse visit.      Relevant Medications   enalapril-hydrochlorothiazide (VASERETIC) 10-25 MG tablet   Other Relevant Orders   Lipid Panel w/reflex Direct LDL   COMPLETE METABOLIC PANEL WITH GFR   CBC   B12   Other Visit Diagnoses     Paresthesias       Relevant Orders   Lipid Panel w/reflex Direct LDL   COMPLETE METABOLIC PANEL WITH GFR   CBC   B12   Burning sensation       Relevant Orders   Lipid Panel w/reflex Direct LDL   COMPLETE METABOLIC PANEL WITH GFR   CBC   B12       Paresthesias-I would like to at least check a TSH and a B12 today though the symptoms  certainly could be medication related.  We will also check for electrolyte abnormalities.  Return in about 4 weeks (around 06/29/2022) for Nurse visit fo recheck BP.    Beatrice Lecher, MD

## 2022-06-01 NOTE — Assessment & Plan Note (Signed)
Blood pressure looks phenomenal today but working to make a change because she has been getting itching and burning sensation in her hands and feet.  And it started directly when she first started the losartan.  We will try switching to an ACE inhibitor with a diuretic to see if that is tolerated well if not consider switching categories completely.  Follow-up in 4 weeks for nurse visit.

## 2022-06-02 ENCOUNTER — Other Ambulatory Visit: Payer: Self-pay | Admitting: *Deleted

## 2022-06-02 DIAGNOSIS — R899 Unspecified abnormal finding in specimens from other organs, systems and tissues: Secondary | ICD-10-CM

## 2022-06-02 LAB — COMPLETE METABOLIC PANEL WITH GFR
AG Ratio: 1.8 (calc) (ref 1.0–2.5)
ALT: 15 U/L (ref 6–29)
AST: 19 U/L (ref 10–35)
Albumin: 4.5 g/dL (ref 3.6–5.1)
Alkaline phosphatase (APISO): 102 U/L (ref 37–153)
BUN/Creatinine Ratio: 18 (calc) (ref 6–22)
BUN: 21 mg/dL (ref 7–25)
CO2: 24 mmol/L (ref 20–32)
Calcium: 9.8 mg/dL (ref 8.6–10.4)
Chloride: 102 mmol/L (ref 98–110)
Creat: 1.19 mg/dL — ABNORMAL HIGH (ref 0.60–1.00)
Globulin: 2.5 g/dL (calc) (ref 1.9–3.7)
Glucose, Bld: 77 mg/dL (ref 65–99)
Potassium: 5.1 mmol/L (ref 3.5–5.3)
Sodium: 138 mmol/L (ref 135–146)
Total Bilirubin: 0.7 mg/dL (ref 0.2–1.2)
Total Protein: 7 g/dL (ref 6.1–8.1)
eGFR: 49 mL/min/{1.73_m2} — ABNORMAL LOW (ref >=60)

## 2022-06-02 LAB — LIPID PANEL W/REFLEX DIRECT LDL
Cholesterol: 217 mg/dL — ABNORMAL HIGH (ref <200)
HDL: 66 mg/dL (ref >=50)
LDL Cholesterol (Calc): 135 mg/dL (calc) — ABNORMAL HIGH
Non-HDL Cholesterol (Calc): 151 mg/dL (calc) — ABNORMAL HIGH (ref <130)
Total CHOL/HDL Ratio: 3.3 (calc) (ref <5.0)
Triglycerides: 69 mg/dL (ref <150)

## 2022-06-02 LAB — CBC
HCT: 41.9 % (ref 35.0–45.0)
Hemoglobin: 14.5 g/dL (ref 11.7–15.5)
MCH: 31 pg (ref 27.0–33.0)
MCHC: 34.6 g/dL (ref 32.0–36.0)
MCV: 89.7 fL (ref 80.0–100.0)
MPV: 12.1 fL (ref 7.5–12.5)
Platelets: 252 10*3/uL (ref 140–400)
RBC: 4.67 10*6/uL (ref 3.80–5.10)
RDW: 12.6 % (ref 11.0–15.0)
WBC: 6.1 10*3/uL (ref 3.8–10.8)

## 2022-06-02 LAB — VITAMIN B12: Vitamin B-12: 314 pg/mL (ref 200–1100)

## 2022-06-02 NOTE — Progress Notes (Signed)
Hi Stephanie Stone, LDL cholesterol looks a little bit better this year compared to last year.  Good work and bringing that down just continue to work on [employer] and regular exercise.  Kidney function went up slightly your baseline is closer to around 0.9.  This time it was around 1.1 so would like to recheck that in about 1 month.  Your blood count is normal.  B12 is normal.  Let me know over the next couple of weeks if the new blood pressure pill is working out better.  We also like to get you scheduled for a Medicare wellness exam with her Medicare wellness nurse named [place] Lakengren.  We can get you scheduled in person here in our office or over the phone if you would prefer.  Medicare request that we do this exam on all beneficiaries as part of your benefit plan.

## 2022-06-14 ENCOUNTER — Ambulatory Visit (INDEPENDENT_AMBULATORY_CARE_PROVIDER_SITE_OTHER): Payer: Medicare Other | Admitting: Family Medicine

## 2022-06-14 ENCOUNTER — Encounter: Payer: Self-pay | Admitting: General Practice

## 2022-06-14 DIAGNOSIS — Z0000 Encounter for general adult medical examination without abnormal findings: Secondary | ICD-10-CM | POA: Diagnosis not present

## 2022-06-14 NOTE — Patient Instructions (Addendum)
Stephanie Maintenance Summary and Written Plan of Care  [name] ,  Thank you for allowing me to perform your Medicare Annual Wellness Visit and for your ongoing commitment to your health.   Health Maintenance & Immunization History Health Maintenance  Topic Date Due   COVID-19 Vaccine (4 - 2023-24 season) 06/30/2022 (Originally 03/12/2022)   Zoster Vaccines- Shingrix (1 of 2) 09/13/2022 (Originally 04/27/2001)   Medicare Annual Wellness (AWV)  06/15/2023   Fecal DNA (Cologuard)  12/16/2023   MAMMOGRAM  03/08/2024   DTaP/Tdap/Td (3 - Td or Tdap) 12/11/2025   Pneumonia Vaccine 36+ Years old  Completed   INFLUENZA VACCINE  Completed   DEXA SCAN  Completed   Hepatitis C Screening  Completed   HPV VACCINES  Aged Out   Immunization History  Administered Date(s) Administered   Fluad Quad(high Dose 65+) 05/07/2019, 05/07/2020, 05/19/2021, 04/22/2022   Influenza, High Dose Seasonal PF 05/02/2017   Influenza,inj,Quad PF,6+ Mos 04/20/2013, 05/27/2016, 05/17/2018   Influenza-Unspecified 04/03/2014   PFIZER Comirnaty(Gray Top)Covid-19 Tri-Sucrose Vaccine 10/29/2020   PFIZER(Purple Top)SARS-COV-2 Vaccination 08/17/2019, 09/10/2019   PNEUMOCOCCAL CONJUGATE-20 01/20/2022   Pneumococcal Conjugate-13 05/02/2017   Pneumococcal Polysaccharide-23 05/17/2018   Td 07/13/2003   Tdap 12/12/2015    These are the patient goals that we discussed:  Goals Addressed               This Visit's Progress     Patient Stated (pt-stated)        06/14/2022 AWV Goal: Exercise for General Health  Patient will verbalize understanding of the benefits of increased physical activity: Exercising regularly is important. It will improve your overall fitness, flexibility, and endurance. Regular exercise also will improve your overall health. It can help you control your weight, reduce stress, and improve your bone density. Over the next year, patient will increase physical activity as  tolerated with a goal of at least 150 minutes of moderate physical activity per week.  You can tell that you are exercising at a moderate intensity if your heart starts beating faster and you start breathing faster but can still hold a conversation. Moderate-intensity exercise ideas include: Walking 1 mile (1.6 km) in about 15 minutes Biking Hiking Golfing Dancing Water aerobics Patient will verbalize understanding of everyday activities that increase physical activity by providing examples like the following: Yard work, such as: Sales promotion account executive Gardening Washing windows or floors Patient will be able to explain general safety guidelines for exercising:  Before you start a new exercise program, talk with your health care provider. Do not exercise so much that you hurt yourself, feel dizzy, or get very short of breath. Wear comfortable clothes and wear shoes with good support. Drink plenty of water while you exercise to prevent dehydration or heat stroke. Work out until your breathing and your heartbeat get faster.          This is a list of Health Maintenance Items that are overdue or due now: Shingles vaccine    Orders/Referrals Placed Today: No orders of the defined types were placed in this encounter.  (Contact our referral department at [phone] if you have not spoken with someone about your referral appointment within the next 5 days)    Follow-up Plan Follow-up with [name], MD as planned Schedule shingles vaccine at the pharmacy. Medicare wellness visit in one year. AVS printed and mailed to the patient.  Health Maintenance, Female Adopting a healthy lifestyle and getting preventive care are important in promoting health and wellness. Ask your health care provider about: The right schedule for you to have regular tests and exams. Things you can do on your own  to prevent diseases and keep yourself healthy. What should I know about diet, weight, and exercise? Eat a healthy diet  Eat a diet that includes plenty of vegetables, fruits, low-fat dairy products, and lean protein. Do not eat a lot of foods that are high in solid fats, added sugars, or sodium. Maintain a healthy weight Body mass index (BMI) is used to identify weight problems. It estimates body fat based on height and weight. Your health care provider can help determine your BMI and help you achieve or maintain a healthy weight. Get regular exercise Get regular exercise. This is one of the most important things you can do for your health. Most adults should: Exercise for at least 150 minutes each week. The exercise should increase your heart rate and make you sweat (moderate-intensity exercise). Do strengthening exercises at least twice a week. This is in addition to the moderate-intensity exercise. Spend less time sitting. Even light physical activity can be beneficial. Watch cholesterol and blood lipids Have your blood tested for lipids and cholesterol at 71 years of age, then have this test every 5 years. Have your cholesterol levels checked more often if: Your lipid or cholesterol levels are high. You are older than 71 years of age. You are at high risk for heart disease. What should I know about cancer screening? Depending on your health history and family history, you may need to have cancer screening at various ages. This may include screening for: Breast cancer. Cervical cancer. Colorectal cancer. Skin cancer. Lung cancer. What should I know about heart disease, diabetes, and high blood pressure? Blood pressure and heart disease High blood pressure causes heart disease and increases the risk of stroke. This is more likely to develop in people who have high blood pressure readings or are overweight. Have your blood pressure checked: Every 3-5 years if you are 18-39 years of  age. Every year if you are 70 years old or older. Diabetes Have regular diabetes screenings. This checks your fasting blood sugar level. Have the screening Stone: Once every three years after age 86 if you are at a normal weight and have a low risk for diabetes. More often and at a younger age if you are overweight or have a high risk for diabetes. What should I know about preventing infection? Hepatitis B If you have a higher risk for hepatitis B, you should be screened for this virus. Talk with your health care provider to find out if you are at risk for hepatitis B infection. Hepatitis C Testing is recommended for: Everyone born from [birth year] through 1965. Anyone with known risk factors for hepatitis C. Sexually transmitted infections (STIs) Get screened for STIs, including gonorrhea and chlamydia, if: You are sexually active and are younger than 71 years of age. You are older than 71 years of age and your health care provider tells you that you are at risk for this type of infection. Your sexual activity has changed since you were last screened, and you are at increased risk for chlamydia or gonorrhea. Ask your health care provider if you are at risk. Ask your health care provider about whether you are at high risk for HIV. Your health care provider may recommend a prescription medicine to help prevent  HIV infection. If you choose to take medicine to prevent HIV, you should first get tested for HIV. You should then be tested every 3 months for as long as you are taking the medicine. Pregnancy If you are about to stop having your period (premenopausal) and you may become pregnant, seek counseling before you get pregnant. Take 400 to 800 micrograms (mcg) of folic acid every day if you become pregnant. Ask for birth control (contraception) if you want to prevent pregnancy. Osteoporosis and menopause Osteoporosis is a disease in which the bones lose minerals and strength with aging. This can  result in bone fractures. If you are 19 years old or older, or if you are at risk for osteoporosis and fractures, ask your health care provider if you should: Be screened for bone loss. Take a calcium or vitamin D supplement to lower your risk of fractures. Be given hormone replacement therapy (HRT) to treat symptoms of menopause. Follow these instructions at home: Alcohol use Do not drink alcohol if: Your health care provider tells you not to drink. You are pregnant, may be pregnant, or are planning to become pregnant. If you drink alcohol: Limit how much you have to: 0-1 drink a day. Know how much alcohol is in your drink. In the [place] one drink equals one 12 oz bottle of beer (355 mL), one 5 oz glass of wine (148 mL), or one 1 oz glass of hard liquor (44 mL). Lifestyle Do not use any products that contain nicotine or tobacco. These products include cigarettes, chewing tobacco, and vaping devices, such as e-cigarettes. If you need help quitting, ask your health care provider. Do not use street drugs. Do not share needles. Ask your health care provider for help if you need support or information about quitting drugs. General instructions Schedule regular health, dental, and eye exams. Stay current with your vaccines. Tell your health care provider if: You often feel depressed. You have ever been abused or do not feel safe at home. Summary Adopting a healthy lifestyle and getting preventive care are important in promoting health and wellness. Follow your health care provider's instructions about healthy diet, exercising, and getting tested or screened for diseases. Follow your health care provider's instructions on monitoring your cholesterol and blood pressure. This information is not intended to replace advice given to you by your health care provider. Make sure you discuss any questions you have with your health care provider. Document Revised: 11/17/2020 Document Reviewed:  11/17/2020 Elsevier Patient Education  [place].

## 2022-06-14 NOTE — Progress Notes (Signed)
MEDICARE ANNUAL WELLNESS VISIT  06/14/2022  Telephone Visit Disclaimer This Medicare AWV was conducted by telephone due to national recommendations for restrictions regarding the COVID-19 Pandemic (e.g. social distancing).  I verified, using two identifiers, that I am speaking with Stephanie Stone or their authorized healthcare agent. I discussed the limitations, risks, security, and privacy concerns of performing an evaluation and management service by telephone and the potential availability of an in-person appointment in the future. The patient expressed understanding and agreed to proceed.  Location of Patient: Home Location of Provider (nurse):  In the office.  Subjective:    Stephanie Stone is a 71 y.o. female patient of [name], Rene Kocher, MD who had a Medicare Annual Wellness Visit today via telephone. Nikkia is Retired and lives with their spouse. she has 2 children. she reports that she is socially active and does interact with friends/family regularly. she is minimally physically active and enjoys sewing and painting.  Patient Care Team: [name], MD as PCP - General (Family Medicine) Darius Bump, [facility] as Pharmacist (Pharmacist)     06/14/2022    9:03 AM 05/22/2021   10:48 AM 07/31/2019   11:07 AM 05/17/2018    9:03 AM 05/02/2017   10:25 AM  Advanced Directives  Does Patient Have a Medical Advance Directive? Yes No No No No  Type of Advance Directive Living will      Does patient want to make changes to medical advance directive? No - Patient declined      Would patient like information on creating a medical advance directive?  No - Patient declined No - Patient declined Yes (MAU/Ambulatory/Procedural Areas - Information given) Yes (MAU/Ambulatory/Procedural Areas - Information given)    Hospital Utilization Over the Past 12 Months: # of hospitalizations or ER visits: 1 # of surgeries: 0  Review of Systems    Patient reports that her overall health is unchanged  compared to last year.  History obtained from chart review and the patient  Patient Reported Readings (BP, Pulse, CBG, Weight, etc) none  Pain Assessment Pain : No/denies pain     Current Medications & Allergies (verified) Allergies as of 06/14/2022       Reactions   Sulfa Antibiotics    Other reaction(s): Unknown   Azithromycin Nausea Only   Codeine    REACTION: NAUSEA   Elemental Sulfur    Lovastatin    Miconazole Nitrate    REACTION: Nausea        Medication List        Accurate as of June 14, 2022  9:21 AM. If you have any questions, ask your nurse or doctor.          aspirin EC 81 MG tablet Take 81 mg by mouth daily.   Biotin 1000 MCG tablet Take 1,000 mcg by mouth daily.   cholecalciferol 25 MCG (1000 UNIT) tablet Commonly known as: VITAMIN D3 Take 1,000 Units by mouth daily.   eletriptan 20 MG tablet Commonly known as: RELPAX Take 1 tablet (20 mg total) by mouth as needed. may repeat in 2 hours if necessary   enalapril-hydrochlorothiazide 10-25 MG tablet Commonly known as: VASERETIC Take 1 tablet by mouth daily.   fluocinonide 0.05 % external solution Commonly known as: LIDEX Apply 1 application topically at bedtime.   fluticasone 50 MCG/ACT nasal spray Commonly known as: FLONASE SHAKE LIQUID AND USE 2 SPRAYS IN EACH NOSTRIL DAILY   gabapentin 100 MG capsule Commonly known as: NEURONTIN  Take 1 capsule (100 mg total) by mouth 3 (three) times daily.   MINOXIDIL (TOPICAL) 5 % Soln Apply 1 application topically at bedtime.   rosuvastatin 5 MG tablet Commonly known as: Crestor Take 1 tablet (5 mg total) by mouth at bedtime as needed.   vitamin E 200 UNIT capsule Take 200 Units by mouth daily.   Zinc 22.5 MG Tabs Take by mouth daily.        History (reviewed): Past Medical History:  Diagnosis Date   Allergy    Alopecia    areata   Anxiety    situational   Depression    Hyperlipidemia    Hypertension    Lung infiltrate  07/12/2008   Resolved on its own without biopsy   Menopause, premature    Migraines    MVP (mitral valve prolapse)    Past Surgical History:  Procedure Laterality Date   ABDOMINAL HYSTERECTOMY  07/12/1977   endometriosis   APPENDECTOMY     Family History  Problem Relation Age of Onset   Heart attack Mother    Hypertension Mother    Coronary artery disease Mother 50   Liver cancer Mother    Cancer Mother    Heart disease Mother    Heart attack Father    Early death Father    Heart disease Father    Heart attack Brother 45   Stroke Brother    Cancer Brother    COPD Brother    Heart disease Brother    Hypertension Brother    Heart disease Brother    Cancer Brother    COPD Brother    Hypertension Brother    Social History   Socioeconomic History   Marital status: Married    Spouse name: Sam   Number of children: 2   Years of education: 12   Highest education level: 12th grade  Occupational History   Occupation: retired    Comment: Psychologist, counselling  Tobacco Use   Smoking status: Never   Smokeless tobacco: Never  Vaping Use   Vaping Use: Never used  Substance and Sexual Activity   Alcohol use: Not Currently    Comment: maybe once a month   Drug use: No   Sexual activity: Not Currently  Other Topics Concern   Not on file  Social History Narrative   Lives with her husband. She has two children. She enjoys painting and sewing.   Social Determinants of Health   Financial Resource Strain: Low Risk  (06/13/2022)   Overall Financial Resource Strain (CARDIA)    Difficulty of Paying Living Expenses: Not hard at all  Food Insecurity: No Food Insecurity (06/13/2022)   Hunger Vital Sign    Worried About Running Out of Food in the Last Year: Never true    Ran Out of Food in the Last Year: Never true  Transportation Needs: No Transportation Needs (06/13/2022)   PRAPARE - Hydrologist (Medical): No    Lack of Transportation (Non-Medical): No   Physical Activity: Insufficiently Active (06/13/2022)   Exercise Vital Sign    Days of Exercise per Week: 3 days    Minutes of Exercise per Session: 30 min  Stress: No Stress Concern Present (06/13/2022)   [place]    Feeling of Stress : Not at all  Social Connections: [place] (06/14/2022)   Social Connection and Isolation Panel [NHANES]    Frequency of Communication with Friends and  Family: More than three times a week    Frequency of Social Gatherings with Friends and Family: More than three times a week    Attends Religious Services: More than 4 times per year    Active Member of Clubs or Organizations: Yes    Attends Archivist Meetings: More than 4 times per year    Marital Status: Married    Activities of Daily Living    06/14/2022    9:04 AM 06/13/2022    8:08 PM  In your present state of health, do you have any difficulty performing the following activities:  Hearing?  0  Vision?  0  Difficulty concentrating or making decisions?  0  Walking or climbing stairs?  0  Dressing or bathing?  0  Doing errands, shopping? 0   Preparing Food and eating ?  N  Using the Toilet?  N  In the past six months, have you accidently leaked urine?  N  Do you have problems with loss of bowel control?  N  Managing your Medications?  N  Managing your Finances?  N  Housekeeping or managing your Housekeeping?  N    Patient Education/ Literacy How often do you need to have someone help you when you read instructions, pamphlets, or other written materials from your doctor or pharmacy?: 1 - Never What is the last grade level you completed in school?: 12th grade  Exercise Current Exercise Habits: The patient does not participate in regular exercise at present, Exercise limited by: None identified  Diet Patient reports consuming 2 meals a day and 0-1 snack(s) a day Patient reports that her primary diet is:  Regular Patient reports that she does have regular access to food.   Depression Screen    06/14/2022    9:05 AM 06/01/2022   11:09 AM 03/02/2022   11:08 AM 12/30/2021    2:19 PM 05/22/2021   10:49 AM 03/09/2021    8:40 AM 11/13/2020    1:28 PM  PHQ 2/9 Scores  PHQ - 2 Score 0 0 0 0 0 0 0  PHQ- 9 Score 0           Fall Risk    06/14/2022    9:05 AM 06/13/2022    8:08 PM 06/01/2022   11:09 AM 03/02/2022   11:08 AM 12/30/2021    2:19 PM  Fall Risk   Falls in the past year? 0 0 0 0 0  Number falls in past yr: 0 0 0 0 0  Injury with Fall? 0  0 0 0  Risk for fall due to : No Fall Risks  No Fall Risks No Fall Risks No Fall Risks  Follow up Falls evaluation completed  Falls evaluation completed Falls evaluation completed Falls prevention discussed;Falls evaluation completed     Objective:  KIALA FARAJ seemed alert and oriented and she participated appropriately during our telephone visit.  Blood Pressure Weight BMI  BP Readings from Last 3 Encounters:  06/01/22 (!) 114/52  04/12/22 123/80  03/02/22 136/75   Wt Readings from Last 3 Encounters:  06/01/22 144 lb 1.9 oz (65.4 kg)  03/02/22 139 lb (63 kg)  02/03/22 137 lb (62.1 kg)   BMI Readings from Last 1 Encounters:  06/01/22 27.23 kg/m    *Unable to obtain current vital signs, weight, and BMI due to telephone visit type  Hearing/Vision  Naleigha did not seem to have difficulty with hearing/understanding during the telephone conversation Reports that she has had  a formal eye exam by an eye care professional within the past year Reports that she has not had a formal hearing evaluation within the past year *Unable to fully assess hearing and vision during telephone visit type  Cognitive Function:    06/14/2022    9:09 AM 05/22/2021   10:56 AM 07/31/2019   11:17 AM 05/17/2018    9:06 AM 05/02/2017   10:25 AM  6CIT Screen  What Year? 0 points 0 points 0 points 0 points 0 points  What month? 0 points 0 points 0 points 0 points 0  points  What time? 0 points 0 points 0 points 0 points 0 points  Count back from 20 0 points 0 points 0 points 0 points 0 points  Months in reverse 0 points 0 points 0 points 0 points 0 points  Repeat phrase 0 points 0 points 0 points 2 points 2 points  Total Score 0 points 0 points 0 points 2 points 2 points   (Normal:0-7, Significant for Dysfunction: >8)  Normal Cognitive Function Screening: Yes   Immunization & Health Maintenance Record Immunization History  Administered Date(s) Administered   Fluad Quad(high Dose 65+) 05/07/2019, 05/07/2020, 05/19/2021, 04/22/2022   Influenza, High Dose Seasonal PF 05/02/2017   Influenza,inj,Quad PF,6+ Mos 04/20/2013, 05/27/2016, 05/17/2018   Influenza-Unspecified 04/03/2014   PFIZER Comirnaty(Gray Top)Covid-19 Tri-Sucrose Vaccine 10/29/2020   PFIZER(Purple Top)SARS-COV-2 Vaccination 08/17/2019, 09/10/2019   PNEUMOCOCCAL CONJUGATE-20 01/20/2022   Pneumococcal Conjugate-13 05/02/2017   Pneumococcal Polysaccharide-23 05/17/2018   Td 07/13/2003   Tdap 12/12/2015    Health Maintenance  Topic Date Due   COVID-19 Vaccine (4 - 2023-24 season) 06/30/2022 (Originally 03/12/2022)   Zoster Vaccines- Shingrix (1 of 2) 09/13/2022 (Originally 04/27/2001)   Medicare Annual Wellness (AWV)  06/15/2023   Fecal DNA (Cologuard)  12/16/2023   MAMMOGRAM  03/08/2024   DTaP/Tdap/Td (3 - Td or Tdap) 12/11/2025   Pneumonia Vaccine 78+ Years old  Completed   INFLUENZA VACCINE  Completed   DEXA SCAN  Completed   Hepatitis C Screening  Completed   HPV VACCINES  Aged Out       Assessment  This is a routine wellness examination for CASSUNDRA MCKEEVER.  Health Maintenance: Due or Overdue There are no preventive care reminders to display for this patient.   MAGON CROSON does not need a referral for Community Assistance: Care Management:   no Social Work:    no Prescription Assistance:  no Nutrition/Diabetes Education:  no   Plan:  Personalized Goals  Goals Addressed                This Visit's Progress     Patient Stated (pt-stated)        06/14/2022 AWV Goal: Exercise for General Health  Patient will verbalize understanding of the benefits of increased physical activity: Exercising regularly is important. It will improve your overall fitness, flexibility, and endurance. Regular exercise also will improve your overall health. It can help you control your weight, reduce stress, and improve your bone density. Over the next year, patient will increase physical activity as tolerated with a goal of at least 150 minutes of moderate physical activity per week.  You can tell that you are exercising at a moderate intensity if your heart starts beating faster and you start breathing faster but can still hold a conversation. Moderate-intensity exercise ideas include: Walking 1 mile (1.6 km) in about 15 minutes Biking Hiking Golfing Dancing Water aerobics Patient will verbalize understanding of everyday activities  that increase physical activity by providing examples like the following: Yard work, such as: Sales promotion account executive Gardening Washing windows or floors Patient will be able to explain general safety guidelines for exercising:  Before you start a new exercise program, talk with your health care provider. Do not exercise so much that you hurt yourself, feel dizzy, or get very short of breath. Wear comfortable clothes and wear shoes with good support. Drink plenty of water while you exercise to prevent dehydration or heat stroke. Work out until your breathing and your heartbeat get faster.        Personalized Health Maintenance & Screening Recommendations  Shingles vaccine  Lung Cancer Screening Recommended: no (Low Dose CT Chest recommended if Age 23-80 years, 30 pack-year currently smoking OR have quit w/in past 15 years) Hepatitis C Screening recommended:  no HIV Screening recommended: no  Advanced Directives: Written information was not prepared per patient's request.  Referrals & Orders No orders of the defined types were placed in this encounter.   Follow-up Plan Follow-up with [name], MD as planned Schedule shingles vaccine at the pharmacy. Medicare wellness visit in one year. AVS printed and mailed to the patient.   I have personally reviewed and noted the following in the patient's chart:   Medical and social history Use of alcohol, tobacco or illicit drugs  Current medications and supplements Functional ability and status Nutritional status Physical activity Advanced directives List of other physicians Hospitalizations, surgeries, and ER visits in previous 12 months Vitals Screenings to include cognitive, depression, and falls Referrals and appointments  In addition, I have reviewed and discussed with Stephanie Stone certain preventive protocols, quality metrics, and best practice recommendations. A written personalized care plan for preventive services as well as general preventive health recommendations is available and can be mailed to the patient at her request.      Tinnie Gens, RN BSN  06/14/2022

## 2022-06-29 ENCOUNTER — Ambulatory Visit (INDEPENDENT_AMBULATORY_CARE_PROVIDER_SITE_OTHER): Payer: Medicare Other | Admitting: Pharmacist

## 2022-06-29 ENCOUNTER — Ambulatory Visit: Payer: Medicare Other | Admitting: Family Medicine

## 2022-06-29 DIAGNOSIS — E7800 Pure hypercholesterolemia, unspecified: Secondary | ICD-10-CM

## 2022-06-29 DIAGNOSIS — I10 Essential (primary) hypertension: Secondary | ICD-10-CM

## 2022-06-29 NOTE — Progress Notes (Unsigned)
Chronic Care Management Pharmacy Note  06/29/2022 Name:  Stephanie Stone MRN:  [id number] DOB:  [birth year]  Summary: addressed hyperlipidemia, chronic pain/headache. Currently taking losartan-hctz 50-12.5m tablet once daily.  BP readings at home: ~ 130s/80, checks at home occasionally  Hands itch and occasional pins/needles  She filled her new rosuvastatin but has not started yet.   Recommendations/Changes made from today's visit: continue lifestyle modifications, reminded patient to obtain labs (already ordered), initiate statin.  Plan: f/u with pharmacist in 3 months  Subjective: Stephanie SPIEWAKis an 89y.o. year old female who is a primary patient of [name], [name] MD.  The CCM team was consulted for assistance with disease management and care coordination needs.    Engaged with patient by telephone for follow up visit in response to provider referral for pharmacy case management and/or care coordination services.   Consent to Services:  The patient was given information about Chronic Care Management services, agreed to services, and gave verbal consent prior to initiation of services.  Please see initial visit note for detailed documentation.   Patient Care Team: MHali Marry MD as PCP - General (Family Medicine) KDarius Bump RSt. Elizabeth Covingtonas Pharmacist (Pharmacist)   Objective:  Lab Results  Component Value Date   CREATININE 1.19 (H) 06/01/2022   CREATININE 0.81 05/19/2021   CREATININE 0.91 03/09/2021       Component Value Date/Time   CHOL 217 (H) 06/01/2022 0000   TRIG 69 06/01/2022 0000   TRIG 117 12/06/2008 0000   HDL 66 06/01/2022 0000   CHOLHDL 3.3 06/01/2022 0000   VLDL 19 09/23/2016 0849   LDLCALC 135 (H) 06/01/2022 0000       Latest Ref Rng & Units 06/01/2022   12:00 AM 11/11/2020    8:18 AM 09/23/2016    8:49 AM  Hepatic Function  Total Protein 6.1 - 8.1 g/dL 7.0  6.6  6.2   Albumin 3.6 - 5.1 g/dL   4.1   AST 10 - 35 U/L _0 ALT 6 -  29 U/L _1 Alk Phosphatase 33 - 130 U/L   86   Total Bilirubin 0.2 - 1.2 mg/dL 0.7  0.5  0.5     Lab Results  Component Value Date/Time   TSH 4.60 (H) 11/11/2020 08:18 AM   TSH 4.94 04/27/2013 12:00 AM   TSH 3.804 08/06/2010 06:02 PM       Latest Ref Rng & Units 06/01/2022   12:00 AM 01/20/2022   12:00 AM 03/09/2021   12:00 AM  CBC  WBC 3.8 - 10.8 Thousand/uL 6.1  5.8  6.4   Hemoglobin 11.7 - 15.5 g/dL 14.5  14.1  14.9   Hematocrit 35.0 - 45.0 % 41.9  40.9  46.0   Platelets 140 - 400 Thousand/uL 252  293  230     Clinical ASCVD: The 10-year ASCVD risk score (Arnett DK, et al., 2019) is: 11.2%   Values used to calculate the score:     Age: 71years     Sex: Female     Is Non-Hispanic African American: No     Diabetic: No     Tobacco smoker: No     Systolic Blood Pressure: 1578mmHg     Is BP treated: Yes     HDL Cholesterol: 66 mg/dL     Total Cholesterol: 217 mg/dL     Social History   Tobacco Use  Smoking Status Never  Smokeless Tobacco Never   BP Readings from Last 3 Encounters:  06/01/22 (!) 114/52  04/12/22 123/80  03/02/22 136/75   Pulse Readings from Last 3 Encounters:  06/01/22 75  04/12/22 73  03/02/22 85   Wt Readings from Last 3 Encounters:  06/01/22 144 lb 1.9 oz (65.4 kg)  03/02/22 139 lb (63 kg)  02/03/22 137 lb (62.1 kg)    Assessment: Review of patient past medical history, allergies, medications, health status, including review of consultants reports, laboratory and other test data, was performed as part of comprehensive evaluation and provision of chronic care management services.   SDOH:  (Social Determinants of Health) assessments and interventions performed:  SDOH Interventions    Flowsheet Row Office Visit from 05/22/2021 in [place] Office Visit from 11/09/2017 in [place] Interventions Intervention Not  Indicated --  Housing Interventions Intervention Not Indicated --  Transportation Interventions Intervention Not Indicated --  Depression Interventions/Treatment  -- Currently on Treatment  Financial Strain Interventions Intervention Not Indicated --  Physical Activity Interventions Intervention Not Indicated --  Stress Interventions Intervention Not Indicated --  Social Connections Interventions Intervention Not Indicated --       CCM Care Plan  Allergies  Allergen Reactions   Sulfa Antibiotics     Other reaction(s): Unknown   Azithromycin Nausea Only   Codeine     REACTION: NAUSEA   Elemental Sulfur    Lovastatin    Miconazole Nitrate     REACTION: Nausea    Medications Reviewed Today     Reviewed by Tinnie Gens, RN (Registered Nurse) on 06/14/22 at [place] List Status: <None>   Medication Order Taking? Sig Documenting Provider Last Dose Status Informant  aspirin 81 MG EC tablet [id number] Yes Take 81 mg by mouth daily. [provider] Taking Active   Biotin 1000 MCG tablet [id number] Yes Take 1,000 mcg by mouth daily. [provider] Taking Active   cholecalciferol (VITAMIN D3) 25 MCG (1000 UNIT) tablet [id number] Yes Take 1,000 Units by mouth daily. [provider] Taking Active   eletriptan (RELPAX) 20 MG tablet [id number] Yes Take 1 tablet (20 mg total) by mouth as needed. may repeat in 2 hours if necessary [name], MD Taking Active   enalapril-hydrochlorothiazide (VASERETIC) 10-25 MG tablet [id number] No Take 1 tablet by mouth daily.  Patient not taking: Reported on 06/14/2022   [name], MD Not Taking Active   fluocinonide (LIDEX) 0.05 % external solution [id number] Yes Apply 1 application topically at bedtime. [provider] Taking Active   fluticasone (FLONASE) 50 MCG/ACT nasal spray [id number] Yes SHAKE LIQUID AND USE 2 SPRAYS IN EACH NOSTRIL DAILY [name], MD Taking Active   gabapentin  (NEURONTIN) 100 MG capsule [id number] Yes Take 1 capsule (100 mg total) by mouth 3 (three) times daily. [name], MD Taking Active            Med Note Darius Bump   Fri Dec 11, 2021 10:32 AM) Taking PRN for neuralgia near ear.  MINOXIDIL, TOPICAL, 5 % SOLN [id number] Yes Apply 1 application topically at bedtime. [name], MD Taking Active   rosuvastatin (CRESTOR) 5 MG tablet [id number] Yes Take 1 tablet (5 mg total) by mouth at bedtime as needed. [name], MD Taking Active  Med Note Madilyn Fireman, CATHERINE D   Tue Mar 02, 2022  7:38 AM)    vitamin E 200 UNIT capsule [id number] Yes Take 200 Units by mouth daily. [provider] Taking Active   Zinc 22.5 MG TABS [id number] Yes Take by mouth daily. [provider] Taking Active             Patient Active Problem List   Diagnosis Date Noted   Bronchiectasis ([place]) 12/30/2021   Sleep disturbance 05/19/2021   Degenerative disc disease, cervical 08/07/2019   Occipital neuralgia of right side 08/07/2019   Slow transit constipation 10/01/2014   Hyperlipidemia 08/11/2010   DEPRESSION, SITUATIONAL 08/11/2010   Essential hypertension 07/08/2010   ALOPECIA AREATA 05/29/2010   MENOPAUSE, PREMATURE 02/17/2007   Migraine headache 02/17/2007   MVP (mitral valve prolapse) 02/17/2007    Immunization History  Administered Date(s) Administered   Fluad Quad(high Dose 65+) 05/07/2019, 05/07/2020, 05/19/2021, 04/22/2022   Influenza, High Dose Seasonal PF 05/02/2017   Influenza,inj,Quad PF,6+ Mos 04/20/2013, 05/27/2016, 05/17/2018   Influenza-Unspecified 04/03/2014   PFIZER Comirnaty(Gray Top)Covid-19 Tri-Sucrose Vaccine 10/29/2020   PFIZER(Purple Top)SARS-COV-2 Vaccination 08/17/2019, 09/10/2019   PNEUMOCOCCAL CONJUGATE-20 01/20/2022   Pneumococcal Conjugate-13 05/02/2017   Pneumococcal Polysaccharide-23 05/17/2018   Td 07/13/2003   Tdap 12/12/2015    Conditions to be  addressed/monitored: HLD and chronic pain  There are no care plans that you recently modified to display for this patient.    Medication Assistance: None required.  Patient affirms current coverage meets needs.  Patient's preferred pharmacy is:  [id number] - [place], [place] - [address] AT [place] [address] [place] [address] Phone: [phone] Fax: [phone]   Uses pill box? No - not taking many medications, current system doing well for her Pt endorses 100% compliance  Follow Up:  Patient agrees to Care Plan and Follow-up.  Plan: Telephone follow up appointment with care management team member scheduled for:  3 months  Larinda Buttery, PharmD Clinical Pharmacist [facility] [phone]

## 2022-08-19 DIAGNOSIS — I700 Atherosclerosis of aorta: Secondary | ICD-10-CM | POA: Diagnosis not present

## 2022-08-19 DIAGNOSIS — J479 Bronchiectasis, uncomplicated: Secondary | ICD-10-CM | POA: Diagnosis not present

## 2022-08-25 DIAGNOSIS — I2510 Atherosclerotic heart disease of native coronary artery without angina pectoris: Secondary | ICD-10-CM | POA: Diagnosis not present

## 2022-08-25 DIAGNOSIS — J479 Bronchiectasis, uncomplicated: Secondary | ICD-10-CM | POA: Diagnosis not present

## 2022-08-25 DIAGNOSIS — R918 Other nonspecific abnormal finding of lung field: Secondary | ICD-10-CM | POA: Diagnosis not present

## 2022-08-31 DIAGNOSIS — J479 Bronchiectasis, uncomplicated: Secondary | ICD-10-CM | POA: Diagnosis not present

## 2022-09-03 DIAGNOSIS — I10 Essential (primary) hypertension: Secondary | ICD-10-CM | POA: Diagnosis not present

## 2022-09-03 DIAGNOSIS — E785 Hyperlipidemia, unspecified: Secondary | ICD-10-CM | POA: Diagnosis not present

## 2022-09-03 DIAGNOSIS — J479 Bronchiectasis, uncomplicated: Secondary | ICD-10-CM | POA: Diagnosis not present

## 2022-09-03 DIAGNOSIS — Z7982 Long term (current) use of aspirin: Secondary | ICD-10-CM | POA: Diagnosis not present

## 2022-09-03 DIAGNOSIS — J988 Other specified respiratory disorders: Secondary | ICD-10-CM | POA: Diagnosis not present

## 2022-09-03 DIAGNOSIS — J470 Bronchiectasis with acute lower respiratory infection: Secondary | ICD-10-CM | POA: Diagnosis not present

## 2022-09-03 DIAGNOSIS — Z79899 Other long term (current) drug therapy: Secondary | ICD-10-CM | POA: Diagnosis not present

## 2022-09-06 DIAGNOSIS — J479 Bronchiectasis, uncomplicated: Secondary | ICD-10-CM | POA: Diagnosis not present

## 2022-09-06 DIAGNOSIS — Z7982 Long term (current) use of aspirin: Secondary | ICD-10-CM | POA: Diagnosis not present

## 2022-09-06 DIAGNOSIS — J988 Other specified respiratory disorders: Secondary | ICD-10-CM | POA: Diagnosis not present

## 2022-09-06 DIAGNOSIS — Z79899 Other long term (current) drug therapy: Secondary | ICD-10-CM | POA: Diagnosis not present

## 2022-10-21 DIAGNOSIS — J479 Bronchiectasis, uncomplicated: Secondary | ICD-10-CM | POA: Diagnosis not present

## 2022-11-12 ENCOUNTER — Encounter: Payer: Self-pay | Admitting: Family Medicine

## 2022-11-12 MED ORDER — ENALAPRIL MALEATE 10 MG PO TABS: 10.0000 mg | ORAL_TABLET | Freq: Every day | ORAL | 1 refills | Status: DC

## 2022-11-12 MED ORDER — HYDROCHLOROTHIAZIDE 25 MG PO TABS: 25.0000 mg | ORAL_TABLET | Freq: Every day | ORAL | 1 refills | Status: DC

## 2022-11-19 ENCOUNTER — Encounter: Payer: Self-pay | Admitting: Family Medicine

## 2022-11-19 ENCOUNTER — Ambulatory Visit (INDEPENDENT_AMBULATORY_CARE_PROVIDER_SITE_OTHER): Payer: Medicare Other | Admitting: Family Medicine

## 2022-11-19 VITALS — BP 135/72 | HR 87 | Ht 61.0 in | Wt 153.0 lb

## 2022-11-19 DIAGNOSIS — R21 Rash and other nonspecific skin eruption: Secondary | ICD-10-CM

## 2022-11-19 DIAGNOSIS — N8110 Cystocele, unspecified: Secondary | ICD-10-CM | POA: Diagnosis not present

## 2022-11-19 MED ORDER — NYSTATIN 100000 UNIT/GM EX POWD
1.0000 | Freq: Two times a day (BID) | CUTANEOUS | 1 refills | Status: DC
Start: 2022-11-19 — End: 2023-01-19

## 2022-11-19 MED ORDER — FLUCONAZOLE 150 MG PO TABS
150.0000 mg | ORAL_TABLET | ORAL | 0 refills | Status: DC
Start: 2022-11-19 — End: 2023-01-19

## 2022-11-19 NOTE — Progress Notes (Signed)
Established Patient Office Visit  Subjective   Patient ID: Stephanie Stone, female    DOB: 19-May-1951  Age: 72 y.o. MRN: [id number]  Chief Complaint  Patient presents with   Rash    Pt reports that this has been bothering her for several months. The affected areas are under her breasts and groin creases. She stated that the areas itch some but not all of the time. She also stated that she noticed a bulge coming from her vaginal area    HPI 2 month of rash that started under her breasts and then started appearing in the groin creases.  Rash occasionally itches.  She wondered initially if it was her underwear on her bra since it started there but then when it moved to the groin area she figured it was something else.  She already uses dye free perfume few products including detergents etc.  This morning she was checking the rash on the groin to see if it had started moving towards the vaginal area and noticed a bulge.  She has not had any pain or discomfort there.  Prior history of complete hysterectomy and oophorectomy.     ROS    Objective:     BP 135/72   Pulse 87   Ht 5\' 1"  (1.549 m)   Wt 153 lb (69.4 kg)   SpO2 97%   BMI 28.91 kg/m    Physical Exam Vitals reviewed. Exam conducted with a chaperone present.  Constitutional:      Appearance: She is well-developed.  HENT:     Head: Normocephalic and atraumatic.  Eyes:     Conjunctiva/sclera: Conjunctivae normal.  Cardiovascular:     Rate and Rhythm: Normal rate.  Pulmonary:     Effort: Pulmonary effort is normal.  Genitourinary:    Pubic Area: Rash present.     Labia:        Right: No rash.        Left: No rash.      Urethra: No prolapse.     Vagina: Prolapsed vaginal walls present.     Comments: Anterior prolapse of the vaginal wall. Nontender or lump.  Skin:    General: Skin is dry.     Coloration: Skin is not pale.     Comments: Erythematous macular rash underneath both breasts.  In the groin area the rash is  more scattered.  No maceration or peeling skin.  No weeping.  The borders seem to fade instead of being well-demarcated.  Neurological:     Mental Status: She is alert and oriented to person, place, and time.  Psychiatric:        Behavior: Behavior normal.      No results found for any visits on 11/19/22.    The 10-year ASCVD risk score (Arnett DK, et al., 2019) is: 15.3%    Assessment & Plan:   Problem List Items Addressed This Visit   None Visit Diagnoses     Rash    -  Primary   Relevant Medications   fluconazole (DIFLUCAN) 150 MG tablet   nystatin (MYCOSTATIN/NYSTOP) powder   Vaginal wall prolapse       Relevant Orders   Ambulatory referral to Obstetrics / Gynecology      Vaginal prolapse - will refer to gyn for further evaluation and treatment options.   Rash - likley yeast. Will tx with oral diflucan since it is under the breast area and in the groin.  Will also treat with nystatin  powder.  We can always switch to cream if she prefers.  If not improving after 2 weeks then please let me know and consider treatment for erythrasma.  No follow-ups on file.    Nani Gasser, MD

## 2022-11-26 ENCOUNTER — Encounter: Payer: Self-pay | Admitting: Family Medicine

## 2022-12-07 ENCOUNTER — Other Ambulatory Visit: Payer: Self-pay | Admitting: Family Medicine

## 2022-12-23 ENCOUNTER — Encounter (INDEPENDENT_AMBULATORY_CARE_PROVIDER_SITE_OTHER): Payer: Medicare Other | Admitting: Family Medicine

## 2022-12-23 DIAGNOSIS — L304 Erythema intertrigo: Secondary | ICD-10-CM | POA: Insufficient documentation

## 2022-12-23 MED ORDER — CLOTRIMAZOLE-BETAMETHASONE 1-0.05 % EX CREA
1.0000 | TOPICAL_CREAM | Freq: Two times a day (BID) | CUTANEOUS | 0 refills | Status: DC
Start: 2022-12-23 — End: 2022-12-29

## 2022-12-23 NOTE — Telephone Encounter (Signed)
I spent 5 total minutes of online digital evaluation and management services in this patient-initiated request for online care. 

## 2022-12-29 ENCOUNTER — Encounter: Payer: Self-pay | Admitting: Family Medicine

## 2022-12-29 ENCOUNTER — Other Ambulatory Visit: Payer: Self-pay | Admitting: Sports Medicine

## 2022-12-29 DIAGNOSIS — L304 Erythema intertrigo: Secondary | ICD-10-CM

## 2023-01-04 DIAGNOSIS — R300 Dysuria: Secondary | ICD-10-CM | POA: Diagnosis not present

## 2023-01-16 NOTE — Progress Notes (Unsigned)
[facility] New Patient Evaluation and Consultation  Referring Provider: Agapito Games, * PCP: Agapito Games, MD Date of Service: 01/17/2023  SUBJECTIVE Chief Complaint: No chief complaint on file.  History of Present Illness: JAQUA KOPPLIN is a 72 y.o. White or Caucasian female seen in consultation at the request of Dr. Linford Arnold for evaluation of prolapse.    ***Review of records significant for: ***  Urinary Symptoms: {urine leakage?:24754} Leaks *** time(s) per {days/wks/mos/yrs:310907}.  Pad use: {NUMBERS 1-10:18281} {pad option:24752} per day.   She {ACTION; IS/IS RUE:45409811} bothered by her UI symptoms.  Day time voids ***.  Nocturia: *** times per night to void. Voiding dysfunction: she {empties:24755} her bladder well.  {DOES NOT does:27190::"does not"} use a catheter to empty bladder.  When urinating, she feels {urine symptoms:24756} Drinks: *** per day  UTIs: {NUMBERS 1-10:18281} UTI's in the last year.   {ACTIONS;DENIES/REPORTS:21021675::"Denies"} history of {urologic concerns:24757}  Pelvic Organ Prolapse Symptoms:                  She {denies/ admits to:24761} a feeling of a bulge the vaginal area. It has been present for {NUMBER 1-10:22536} {days/wks/mos/yrs:310907}.  She {denies/ admits to:24761} seeing a bulge.  This bulge {ACTION; IS/IS BJY:78295621} bothersome.  Bowel Symptom: Bowel movements: *** time(s) per {Time; day/week/month:13537} Stool consistency: {stool consistency:24758} Straining: {yes/no:19897}.  Splinting: {yes/no:19897}.  Incomplete evacuation: {yes/no:19897}.  She {denies/ admits to:24761} accidental bowel leakage / fecal incontinence  Occurs: *** time(s) per {Time; day/week/month:13537}  Consistency with leakage: {stool consistency:24758} Bowel regimen: {bowel regimen:24759} Last colonoscopy: Date ***, Results ***  Sexual Function Sexually active: {yes/no:19897}.  Sexual orientation: {Sexual  Orientation:[phone]} Pain with sex: {pain with sex:24762}  Pelvic Pain {denies/ admits to:24761} pelvic pain Location: *** Pain occurs: *** Prior pain treatment: *** Improved by: *** Worsened by: ***   Past Medical History:  Past Medical History:  Diagnosis Date   Allergy    Alopecia    areata   Anxiety    situational   Depression    Hyperlipidemia    Hypertension    Lung infiltrate 07/12/2008   Resolved on its own without biopsy   Menopause, premature    Migraines    MVP (mitral valve prolapse)      Past Surgical History:   Past Surgical History:  Procedure Laterality Date   ABDOMINAL HYSTERECTOMY  07/12/1977   endometriosis   APPENDECTOMY       Past OB/GYN History: G{NUMBERS 1-10:18281} P{NUMBERS 1-10:18281} Vaginal deliveries: ***,  Forceps/ Vacuum deliveries: ***, Cesarean section: *** Menopausal: {menopausal:24763} Contraception: ***. Last pap smear was ***.  Any history of abnormal pap smears: {yes/no:19897}.   Medications: She has a current medication list which includes the following prescription(s): aspirin ec, biotin, cholecalciferol, clotrimazole-betamethasone, eletriptan, enalapril, fluconazole, fluocinonide, fluticasone, gabapentin, hydrochlorothiazide, minoxidil (topical), nystatin, rosuvastatin, vitamin e, and zinc.   Allergies: Patient is allergic to sulfa antibiotics, azithromycin, codeine, elemental sulfur, lovastatin, and miconazole nitrate.   Social History:  Social History   Tobacco Use   Smoking status: Never   Smokeless tobacco: Never  Vaping Use   Vaping Use: Never used  Substance Use Topics   Alcohol use: Not Currently    Comment: maybe once a month   Drug use: No    Relationship status: {relationship status:24764} She lives with ***.   She {ACTION; IS/IS HYQ:65784696} employed ***. Regular exercise: {Yes/No:304960894} History of abuse: {Yes/No:304960894}  Family History:   Family History  Problem Relation Age of  Onset  Heart attack Mother    Hypertension Mother    Coronary artery disease Mother 42   Liver cancer Mother    Cancer Mother    Heart disease Mother    Heart attack Father    Early death Father    Heart disease Father    Heart attack Brother 17   Stroke Brother    Cancer Brother    COPD Brother    Heart disease Brother    Hypertension Brother    Heart disease Brother    Cancer Brother    COPD Brother    Hypertension Brother      Review of Systems: ROS   OBJECTIVE Physical Exam: There were no vitals filed for this visit.  Physical Exam   GU / Detailed Urogynecologic Evaluation:  Pelvic Exam: Normal external female genitalia; Bartholin's and Skene's glands normal in appearance; urethral meatus normal in appearance, no urethral masses or discharge.   CST: {gen negative/positive:315881}  Reflexes: bulbocavernosis {DESC; PRESENT/NOT PRESENT:21021351}, anocutaneous {DESC; PRESENT/NOT PRESENT:21021351} ***bilaterally.  Speculum exam reveals normal vaginal mucosa {With/Without:20273} atrophy. Cervix {exam; gyn cervix:30847}. Uterus {exam; pelvic uterus:30849}. Adnexa {exam; adnexa:12223}.    s/p hysterectomy: Speculum exam reveals normal vaginal mucosa {With/Without:20273}  atrophy and normal vaginal cuff.  Adnexa {exam; adnexa:12223}.    With apex supported, anterior compartment defect was {reduced:24765}  Pelvic floor strength {Roman # I-V:19040}/V, puborectalis {Roman # I-V:19040}/V external anal sphincter {Roman # I-V:19040}/V  Pelvic floor musculature: Right levator {Tender/Non-tender:20250}, Right obturator {Tender/Non-tender:20250}, Left levator {Tender/Non-tender:20250}, Left obturator {Tender/Non-tender:20250}  POP-Q:   POP-Q                                               Aa                                               Ba                                                 C                                                Gh                                                Pb                                               tvl                                                Ap  Bp                                                 D      Rectal Exam:  Normal sphincter tone, {rectocele:24766} distal rectocele, enterocoele {DESC; PRESENT/NOT PRESENT:21021351}, no rectal masses, {sign of:24767} dyssynergia when asking the patient to bear down.  Post-Void Residual (PVR) by Bladder Scan: In order to evaluate bladder emptying, we discussed obtaining a postvoid residual and she agreed to this procedure.  Procedure: The ultrasound unit was placed on the patient's abdomen in the suprapubic region after the patient had voided. A PVR of *** ml was obtained by bladder scan.  Laboratory Results: @ENCLABS @   ***I visualized the urine specimen, noting the specimen to be {urine color:24768}  ASSESSMENT AND PLAN Ms. Bledsoe is a 72 y.o. with: No diagnosis found.    Selmer Dominion, NP   Medical Decision Making:  - Reviewed/ ordered a clinical laboratory test - Reviewed/ ordered a radiologic study - Reviewed/ ordered medicine test - Decision to obtain old records - Discussion of management of or test interpretation with an external physician / other healthcare professional  - Assessment requiring independent historian - Review and summation of prior records - Independent review of image, tracing or specimen

## 2023-01-17 ENCOUNTER — Other Ambulatory Visit (HOSPITAL_COMMUNITY)
Admission: RE | Admit: 2023-01-17 | Discharge: 2023-01-17 | Disposition: A | Discharge status: Home / Self Care | Payer: Medicare Other | Source: Other Acute Inpatient Hospital | Attending: Obstetrics and Gynecology | Admitting: Obstetrics and Gynecology

## 2023-01-17 ENCOUNTER — Ambulatory Visit (INDEPENDENT_AMBULATORY_CARE_PROVIDER_SITE_OTHER): Payer: Medicare Other | Admitting: Obstetrics and Gynecology

## 2023-01-17 ENCOUNTER — Encounter: Payer: Self-pay | Admitting: Obstetrics and Gynecology

## 2023-01-17 VITALS — BP 134/81 | HR 74 | Ht 61.3 in | Wt 150.6 lb

## 2023-01-17 DIAGNOSIS — N8110 Cystocele, unspecified: Secondary | ICD-10-CM | POA: Diagnosis not present

## 2023-01-17 DIAGNOSIS — N816 Rectocele: Secondary | ICD-10-CM | POA: Diagnosis not present

## 2023-01-17 DIAGNOSIS — R350 Frequency of micturition: Secondary | ICD-10-CM | POA: Insufficient documentation

## 2023-01-17 DIAGNOSIS — R82998 Other abnormal findings in urine: Secondary | ICD-10-CM

## 2023-01-17 DIAGNOSIS — N952 Postmenopausal atrophic vaginitis: Secondary | ICD-10-CM | POA: Diagnosis not present

## 2023-01-17 DIAGNOSIS — N393 Stress incontinence (female) (male): Secondary | ICD-10-CM

## 2023-01-17 LAB — POCT URINALYSIS DIPSTICK
Blood, UA: NEGATIVE
Glucose, UA: NEGATIVE
Ketones, UA: NEGATIVE
Nitrite, UA: NEGATIVE
Protein, UA: NEGATIVE
Spec Grav, UA: 1.02 (ref 1.010–1.025)
Urobilinogen, UA: 0.2 E.U./dL
pH, UA: 6.5 (ref 5.0–8.0)

## 2023-01-17 MED ORDER — ESTROGENS CONJUGATED 0.625 MG/GM VA CREA: 1.0000 | TOPICAL_CREAM | VAGINAL | 5 refills | Status: DC

## 2023-01-17 NOTE — Addendum Note (Signed)
Addended by: Selmer Dominion on: 01/17/2023 01:02 PM   Modules accepted: Level of Service

## 2023-01-17 NOTE — Patient Instructions (Addendum)
You have stage 2 anterior wall prolapse (Bladder wall) You have stage 1-2 posterior vaginal wall prolapse   You will use your finger and used a blueberry sized amount onto the finger nightly for 2 weeks and then twice a week after.   We will plan for pessary fitting after a few weeks of vaginal estrogen

## 2023-01-18 LAB — URINE CULTURE: Culture: >=100000 — AB

## 2023-01-19 ENCOUNTER — Ambulatory Visit (INDEPENDENT_AMBULATORY_CARE_PROVIDER_SITE_OTHER): Payer: Medicare Other | Admitting: Family Medicine

## 2023-01-19 VITALS — BP 131/73 | HR 86 | Resp 18 | Ht 61.3 in | Wt 150.2 lb

## 2023-01-19 DIAGNOSIS — B379 Candidiasis, unspecified: Secondary | ICD-10-CM | POA: Diagnosis not present

## 2023-01-19 DIAGNOSIS — B9689 Other specified bacterial agents as the cause of diseases classified elsewhere: Secondary | ICD-10-CM | POA: Diagnosis not present

## 2023-01-19 DIAGNOSIS — J019 Acute sinusitis, unspecified: Secondary | ICD-10-CM

## 2023-01-19 DIAGNOSIS — T3695XA Adverse effect of unspecified systemic antibiotic, initial encounter: Secondary | ICD-10-CM

## 2023-01-19 DIAGNOSIS — R110 Nausea: Secondary | ICD-10-CM | POA: Diagnosis not present

## 2023-01-19 LAB — URINE CULTURE

## 2023-01-19 MED ORDER — FLUCONAZOLE 150 MG PO TABS: 150.0000 mg | ORAL_TABLET | Freq: Once | ORAL | 0 refills | Status: AC

## 2023-01-19 MED ORDER — DOXYCYCLINE MONOHYDRATE 100 MG PO CAPS
100.0000 mg | ORAL_CAPSULE | Freq: Two times a day (BID) | ORAL | 0 refills | Status: AC
Start: 2023-01-19 — End: 2023-01-26

## 2023-01-19 MED ORDER — ONDANSETRON 4 MG PO TBDP: 4.0000 mg | ORAL_TABLET | Freq: Three times a day (TID) | ORAL | 0 refills | Status: DC | PRN

## 2023-01-19 MED ORDER — NITROFURANTOIN MONOHYD MACRO 100 MG PO CAPS: 100.0000 mg | ORAL_CAPSULE | Freq: Two times a day (BID) | ORAL | 0 refills | Status: AC

## 2023-01-19 NOTE — Assessment & Plan Note (Signed)
Very pleasant 72 year old female presents with over 8 days of sinus congestion as well as ear pain.  On exam ears are normal however patient does have tenderness to her sinuses.  With this history along with her history of bronchiectasis we will go ahead and treat with doxycycline.  Have also sent in Diflucan for antibiotic associated yeast infections.

## 2023-01-19 NOTE — Assessment & Plan Note (Signed)
Patient does get nauseous with antibiotics we will have provided her with a refill on her Zofran.

## 2023-01-19 NOTE — Addendum Note (Signed)
Addended by: Selmer Dominion on: 01/19/2023 08:54 AM   Modules accepted: Orders

## 2023-01-19 NOTE — Progress Notes (Signed)
Acute Office Visit  Subjective:     Patient ID: Stephanie Stone, female    DOB: 07-18-1950, 72 y.o.   MRN: [id number]  Chief Complaint  Patient presents with   Ear Pain   Cough    Pt states she started feeling bad last tues or wed with a sore throat, thurs and fri took two covid test both negative. Still feels bad    HPI Patient is in today for cough and congestion along with sinus pain/pressure for over 8 days. She has a hx of bronchiectasis and is followed by Pulmonology.  For the past week she has been having congestion and sinus pain.  She did have a sore throat but that resolved a few days ago.  She does also have some ear pain as well.  She denies any shortness of breath.   Review of Systems  Constitutional:  Negative for chills and fever.  HENT:  Positive for congestion and sinus pain.   Respiratory:  Negative for cough and shortness of breath.   Cardiovascular:  Negative for chest pain.  Neurological:  Negative for headaches.        Objective:    BP 131/73 (BP Location: Left Arm, Patient Position: Sitting, Cuff Size: Normal)   Pulse 86   Resp 18   Ht 5' 1.3" (1.557 m)   Wt 150 lb 4 oz (68.2 kg)   SpO2 100%   BMI 28.11 kg/m    Physical Exam Vitals and nursing note reviewed.  Constitutional:      General: She is not in acute distress.    Appearance: Normal appearance.  HENT:     Head: Normocephalic and atraumatic.     Comments: Tenderness to palpation of right maxillary sinus.    Right Ear: Tympanic membrane, ear canal and external ear normal.     Left Ear: Tympanic membrane, ear canal and external ear normal.     Nose: Nose normal.  Eyes:     Conjunctiva/sclera: Conjunctivae normal.  Cardiovascular:     Rate and Rhythm: Normal rate and regular rhythm.  Pulmonary:     Effort: Pulmonary effort is normal.     Breath sounds: Normal breath sounds.  Neurological:     General: No focal deficit present.     Mental Status: She is alert and oriented to person,  place, and time.  Psychiatric:        Mood and Affect: Mood normal.        Behavior: Behavior normal.        Thought Content: Thought content normal.        Judgment: Judgment normal.     No results found for any visits on 01/19/23.      Assessment & Plan:   Problem List Items Addressed This Visit       Respiratory   Acute bacterial sinusitis - Primary    Very pleasant 72 year old female presents with over 8 days of sinus congestion as well as ear pain.  On exam ears are normal however patient does have tenderness to her sinuses.  With this history along with her history of bronchiectasis we will go ahead and treat with doxycycline.  Have also sent in Diflucan for antibiotic associated yeast infections.      Relevant Medications   doxycycline (MONODOX) 100 MG capsule   fluconazole (DIFLUCAN) 150 MG tablet     Other   Nausea    Patient does get nauseous with antibiotics we will have provided her with  a refill on her Zofran.      Other Visit Diagnoses     Antibiotic-induced yeast infection       Relevant Medications   fluconazole (DIFLUCAN) 150 MG tablet       Meds ordered this encounter  Medications   doxycycline (MONODOX) 100 MG capsule    Sig: Take 1 capsule (100 mg total) by mouth 2 (two) times daily for 7 days.    Dispense:  14 capsule    Refill:  0   fluconazole (DIFLUCAN) 150 MG tablet    Sig: Take 1 tablet (150 mg total) by mouth once for 1 dose.    Dispense:  1 tablet    Refill:  0   ondansetron (ZOFRAN-ODT) 4 MG disintegrating tablet    Sig: Take 1 tablet (4 mg total) by mouth every 8 (eight) hours as needed for nausea or vomiting.    Dispense:  20 tablet    Refill:  0    Return if symptoms worsen or fail to improve.  Charlton Amor, DO

## 2023-01-27 DIAGNOSIS — R0609 Other forms of dyspnea: Secondary | ICD-10-CM | POA: Diagnosis not present

## 2023-01-27 DIAGNOSIS — J479 Bronchiectasis, uncomplicated: Secondary | ICD-10-CM | POA: Diagnosis not present

## 2023-03-02 ENCOUNTER — Ambulatory Visit (INDEPENDENT_AMBULATORY_CARE_PROVIDER_SITE_OTHER): Payer: Medicare Other | Admitting: Obstetrics and Gynecology

## 2023-03-02 ENCOUNTER — Encounter: Payer: Self-pay | Admitting: Obstetrics and Gynecology

## 2023-03-02 VITALS — BP 97/65 | HR 87

## 2023-03-02 DIAGNOSIS — N952 Postmenopausal atrophic vaginitis: Secondary | ICD-10-CM | POA: Diagnosis not present

## 2023-03-02 DIAGNOSIS — R350 Frequency of micturition: Secondary | ICD-10-CM | POA: Diagnosis not present

## 2023-03-02 DIAGNOSIS — N8110 Cystocele, unspecified: Secondary | ICD-10-CM

## 2023-03-02 DIAGNOSIS — N393 Stress incontinence (female) (male): Secondary | ICD-10-CM

## 2023-03-02 DIAGNOSIS — R3915 Urgency of urination: Secondary | ICD-10-CM

## 2023-03-02 LAB — POCT URINALYSIS DIPSTICK
Bilirubin, UA: NEGATIVE
Blood, UA: NEGATIVE
Glucose, UA: NEGATIVE
Nitrite, UA: NEGATIVE
Protein, UA: NEGATIVE
Spec Grav, UA: 1.025 (ref 1.010–1.025)
Urobilinogen, UA: 0.2 E.U./dL
pH, UA: 6 (ref 5.0–8.0)

## 2023-03-02 MED ORDER — ESTRADIOL 10 MCG VA TABS: 1.0000 | ORAL_TABLET | VAGINAL | 3 refills | Status: DC

## 2023-03-02 MED ORDER — VIBEGRON 75 MG PO TABS: 75.0000 mg | ORAL_TABLET | Freq: Every day | ORAL | 5 refills | Status: DC

## 2023-03-02 NOTE — Progress Notes (Signed)
[place] [facility] Return Visit  SUBJECTIVE  History of Present Illness: Stephanie Stone is a 72 y.o. female seen in follow-up for vaginal prolapse and atrophy. Plan at last visit was to start Premarin cream and do a pessary fitting this visit.  She reports she has been more bothered by her frequency lately, as well as the urgency to urinate.   She is unsure if she wants a pessary and would like to consider surgical option of anterior repair.      Past Medical History: Patient  has a past medical history of Allergy, Alopecia, Anxiety, Depression, Hyperlipidemia, Hypertension, Lung infiltrate (07/12/2008), Menopause, premature, Migraines, and MVP (mitral valve prolapse).   Past Surgical History: She  has a past surgical history that includes Appendectomy and Abdominal hysterectomy (07/12/1977).   Medications: She has a current medication list which includes the following prescription(s): aspirin ec, biotin, cholecalciferol, clotrimazole-betamethasone, eletriptan, enalapril, [START ON 03/03/2023] estradiol, fluocinonide, fluticasone, gabapentin, hydrochlorothiazide, minoxidil (topical), ondansetron, rosuvastatin, vibegron, vitamin e, and zinc.   Allergies: Patient is allergic to sulfa antibiotics, azithromycin, codeine, elemental sulfur, lovastatin, and miconazole nitrate.   Social History: Patient  reports that she has never smoked. She has never used smokeless tobacco. She reports that she does not currently use alcohol. She reports that she does not use drugs.      OBJECTIVE  Verbal consent was obtained to perform simple CMG procedure:   Prolapse was reduced using 2 large cotton swabs. Urethra was prepped with betadine and a 22F catheter was placed and bladder was drained completely. The bladder was then backfilled with sterile water by gravity.  First sensation: 130 First Desire: 160 Strong Desire: 210 Capacity: 310 Cough stress test was positive. Valsalva stress test was  positive.  She was was allowed to void on her own.   Interpretation: CMG showed normal sensation, and within normal limits cystometric capacity. Findings positive for stress incontinence, positive for detrusor overactivity.   POC Urine: +For trace leukocytes and trace ketones, negative for all other components  Physical Exam: Vitals:   03/02/23 1059  BP: 97/65  Pulse: 87   Gen: No apparent distress, A&O x 3.  Detailed Urogynecologic Evaluation:  Deferred. Simple CMG completed, see above.    ASSESSMENT AND PLAN    [name] is a 72 y.o. with:  1. Prolapse of anterior vaginal wall   2. SUI (stress urinary incontinence, female)   3. Urinary frequency   4. Urinary urgency    Patient reports she may be interested in having a surgical repair done (possible A&P repair). She is unsure about the maintenance with a pessary and has been unable to use the premarin cream due to her skin sensitivities. Discussion was had about surgical repair and we discussed that she and the surgeon would need to make a plan together regarding specific surgery and addressing her SUI.  Discussed briefly about urethral bulking vs. Sling. She has concerns about mesh which will be further addressed at surgical discussion visit with surgeon.  Patient reports she is urinating frequently and with some urgency. Her simple CMG was positive for detrusor overactivity, which we discussed is causing some of the overactive symptoms she is feeling. She has a history of high blood pressure and struggles with dryness already so would not recommend anticholinergics or Myrbetriq. Will start Vibegron 75mg  daily for OAB symptoms and see if this is helpful.  POC Urine not concerning for UTI today.  Premarin cream caused vaginal irritation. She has a history of  skin sensitivities. Will try vaginal estrace pills and see if this works better for her.   Patient to follow up with surgeon to discuss planning options and be examined later  during the day if possible. She reports more symptoms later in the day and my exam was earlier in the morning.

## 2023-04-12 ENCOUNTER — Encounter: Payer: Self-pay | Admitting: Family Medicine

## 2023-04-12 ENCOUNTER — Ambulatory Visit (INDEPENDENT_AMBULATORY_CARE_PROVIDER_SITE_OTHER): Payer: Medicare Other | Admitting: Family Medicine

## 2023-04-12 VITALS — BP 126/66 | HR 83 | Ht 61.3 in | Wt 151.0 lb

## 2023-04-12 DIAGNOSIS — Z0000 Encounter for general adult medical examination without abnormal findings: Secondary | ICD-10-CM

## 2023-04-12 DIAGNOSIS — I10 Essential (primary) hypertension: Secondary | ICD-10-CM

## 2023-04-12 DIAGNOSIS — R0789 Other chest pain: Secondary | ICD-10-CM | POA: Diagnosis not present

## 2023-04-12 DIAGNOSIS — E7800 Pure hypercholesterolemia, unspecified: Secondary | ICD-10-CM

## 2023-04-12 DIAGNOSIS — Z23 Encounter for immunization: Secondary | ICD-10-CM

## 2023-04-12 DIAGNOSIS — R7989 Other specified abnormal findings of blood chemistry: Secondary | ICD-10-CM | POA: Diagnosis not present

## 2023-04-12 NOTE — Progress Notes (Signed)
Complete physical exam  Patient: Stephanie Stone   DOB: [birth year]   72 y.o. Female  MRN: [id number]  Subjective:    Chief Complaint  Patient presents with   Annual Exam    ANABELEN KAMINSKY is a 72 y.o. female who presents today for a complete physical exam. She reports consuming a general diet.     She generally feels fairly well. . She does have additional problems to discuss today.   She did want to mention that back in May she was driving home after dropping her grandson off at school and had some sudden chest pain she says it was intense enough that she almost went to the emergency room.  She decided to call her husband and kept him on the phone line until she got home because she was almost there she says the time she got home and rested she felt better it went away.  She is also had a couple episodes where she had some sharp pains going into both shoulders but it was not related to the episode of chest pain.  Does have a chronic cough for which she is using albuterol nebulizer  She has also had a few red spot on her upper chest below her breast area.    Most recent fall risk assessment:    11/19/2022    1:11 PM  Fall Risk   Falls in the past year? 0  Number falls in past yr: 0  Injury with Fall? 0  Risk for fall due to : No Fall Risks  Follow up Falls evaluation completed     Most recent depression screenings:    01/19/2023    1:43 PM 11/19/2022    1:11 PM  PHQ 2/9 Scores  PHQ - 2 Score 0 0  PHQ- 9 Score 1          Patient Care Team: Agapito Games, MD as PCP - General (Family Medicine) Gabriel Carina, [facility] as Pharmacist (Pharmacist)   Outpatient Medications Prior to Visit  Medication Sig   aspirin 81 MG EC tablet Take 81 mg by mouth daily.   Biotin 1000 MCG tablet Take 1,000 mcg by mouth daily.   cholecalciferol (VITAMIN D3) 25 MCG (1000 UNIT) tablet Take 1,000 Units by mouth daily.   Estradiol 10 MCG TABS vaginal tablet Place 1 tablet (10 mcg total) vaginally  2 (two) times a week. Place 1 tab nightly for two weeks then twice a week after   fluocinonide (LIDEX) 0.05 % external solution Apply 1 application topically at bedtime.   fluticasone (FLONASE) 50 MCG/ACT nasal spray SHAKE LIQUID AND USE 2 SPRAYS IN EACH NOSTRIL DAILY   gabapentin (NEURONTIN) 100 MG capsule Take 1 capsule (100 mg total) by mouth 3 (three) times daily.   hydrochlorothiazide (HYDRODIURIL) 25 MG tablet Take 1 tablet (25 mg total) by mouth daily.   MINOXIDIL, TOPICAL, 5 % SOLN Apply 1 application topically at bedtime.   sodium chloride HYPERTONIC 3 % nebulizer solution Take 4 mLs by nebulization as needed.   vitamin E 200 UNIT capsule Take 200 Units by mouth daily.   Zinc 22.5 MG TABS Take by mouth daily.   clotrimazole-betamethasone (LOTRISONE) cream APPLY TOPICALLY TO THE AFFECTED AREA TWICE DAILY   enalapril (VASOTEC) 10 MG tablet Take 1 tablet (10 mg total) by mouth daily. (Patient not taking: Reported on 04/12/2023)   rosuvastatin (CRESTOR) 5 MG tablet Take 1 tablet (5 mg total) by mouth at bedtime as needed. (Patient  not taking: Reported on 04/12/2023)   [DISCONTINUED] eletriptan (RELPAX) 20 MG tablet Take 1 tablet (20 mg total) by mouth as needed. may repeat in 2 hours if necessary   [DISCONTINUED] ondansetron (ZOFRAN-ODT) 4 MG disintegrating tablet Take 1 tablet (4 mg total) by mouth every 8 (eight) hours as needed for nausea or vomiting.   [DISCONTINUED] Vibegron 75 MG TABS Take 1 tablet (75 mg total) by mouth daily.   No facility-administered medications prior to visit.    ROS        Objective:     BP 126/66   Pulse 83   Ht 5' 1.3" (1.557 m)   Wt 151 lb (68.5 kg)   SpO2 100%   BMI 28.25 kg/m     Physical Exam Constitutional:      Appearance: Normal appearance.  HENT:     Head: Normocephalic and atraumatic.     Right Ear: Tympanic membrane, ear canal and external ear normal.     Left Ear: Tympanic membrane, ear canal and external ear normal.     Nose:  Nose normal.     Mouth/Throat:     Pharynx: Oropharynx is clear.  Eyes:     Extraocular Movements: Extraocular movements intact.     Conjunctiva/sclera: Conjunctivae normal.     Pupils: Pupils are equal, round, and reactive to light.  Neck:     Thyroid: No thyromegaly.  Cardiovascular:     Rate and Rhythm: Normal rate and regular rhythm.  Pulmonary:     Effort: Pulmonary effort is normal.     Breath sounds: Normal breath sounds.  Abdominal:     General: Bowel sounds are normal.     Palpations: Abdomen is soft.     Tenderness: There is no abdominal tenderness.  Musculoskeletal:        General: No swelling.     Cervical back: Neck supple.  Skin:    General: Skin is warm and dry.  Neurological:     Mental Status: She is oriented to person, place, and time.  Psychiatric:        Mood and Affect: Mood normal.        Behavior: Behavior normal.      No results found for any visits on 04/12/23.      Assessment & Plan:    Routine Health Maintenance and Physical Exam  Immunization History  Administered Date(s) Administered   Fluad Quad(high Dose 65+) 05/07/2019, 05/07/2020, 05/19/2021, 04/22/2022   Fluad Trivalent(High Dose 65+) 04/12/2023   Influenza, High Dose Seasonal PF 05/02/2017   Influenza,inj,Quad PF,6+ Mos 04/20/2013, 05/27/2016, 05/17/2018   Influenza-Unspecified 04/03/2014   PFIZER Comirnaty(Gray Top)Covid-19 Tri-Sucrose Vaccine 10/29/2020   PFIZER(Purple Top)SARS-COV-2 Vaccination 08/17/2019, 09/10/2019   PNEUMOCOCCAL CONJUGATE-20 01/20/2022   Pneumococcal Conjugate-13 05/02/2017   Pneumococcal Polysaccharide-23 05/17/2018   Td 07/13/2003   Tdap 12/12/2015    Health Maintenance  Topic Date Due   Zoster Vaccines- Shingrix (1 of 2) Never done   COVID-19 Vaccine (4 - 2023-24 season) 03/13/2023   Medicare Annual Wellness (AWV)  06/15/2023   Fecal DNA (Cologuard)  12/16/2023   MAMMOGRAM  03/08/2024   DTaP/Tdap/Td (3 - Td or Tdap) 12/11/2025   Pneumonia  Vaccine 1+ Years old  Completed   INFLUENZA VACCINE  Completed   DEXA SCAN  Completed   Hepatitis C Screening  Completed   HPV VACCINES  Aged Out    Discussed health benefits of physical activity, and encouraged her to engage in regular exercise appropriate for her age  and condition.  Problem List Items Addressed This Visit       Cardiovascular and Mediastinum   Essential hypertension   Relevant Orders   CMP14+EGFR   Lipid Panel With LDL/HDL Ratio   TSH   CBC   CT CARDIAC SCORING (SELF PAY ONLY)     Other   Hyperlipidemia   Relevant Orders   CT CARDIAC SCORING (SELF PAY ONLY)   Other Visit Diagnoses     Wellness examination    -  Primary   Atypical chest pain       Relevant Orders   CMP14+EGFR   Lipid Panel With LDL/HDL Ratio   TSH   CBC   EKG 12-Lead   CT CARDIAC SCORING (SELF PAY ONLY)   Abnormal TSH       Relevant Orders   CMP14+EGFR   Lipid Panel With LDL/HDL Ratio   TSH   CBC   Encounter for immunization       Relevant Orders   Flu Vaccine Trivalent High Dose (Fluad) (Completed)       Keep up a regular exercise program and make sure you are eating a healthy diet Try to eat 4 servings of dairy a day, or if you are lactose intolerant take a calcium with vitamin D daily.  Your vaccines are up to date.   Episode of atypical chest pain in May-EKG today shows rate of 88 bpm, normal sinus rhythm.  Creased R wave in lead III compared to EKG from 2017 but otherwise no significant changes.  Did discuss maybe getting a cardiac CT score.  She is open to doing that.  Return in about 6 months (around 10/11/2023) for Hypertension.     Nani Gasser, MD

## 2023-04-13 ENCOUNTER — Ambulatory Visit: Payer: Medicare Other | Admitting: Obstetrics and Gynecology

## 2023-04-14 ENCOUNTER — Ambulatory Visit (INDEPENDENT_AMBULATORY_CARE_PROVIDER_SITE_OTHER): Payer: Self-pay

## 2023-04-14 DIAGNOSIS — R0789 Other chest pain: Secondary | ICD-10-CM

## 2023-04-14 DIAGNOSIS — R7989 Other specified abnormal findings of blood chemistry: Secondary | ICD-10-CM | POA: Diagnosis not present

## 2023-04-14 DIAGNOSIS — I10 Essential (primary) hypertension: Secondary | ICD-10-CM

## 2023-04-14 DIAGNOSIS — E7800 Pure hypercholesterolemia, unspecified: Secondary | ICD-10-CM

## 2023-04-15 ENCOUNTER — Other Ambulatory Visit: Payer: Self-pay | Admitting: Family Medicine

## 2023-04-15 LAB — LIPID PANEL WITH LDL/HDL RATIO
Cholesterol, Total: 204 mg/dL — ABNORMAL HIGH (ref 100–199)
HDL: 59 mg/dL (ref >39)
LDL Chol Calc (NIH): 129 mg/dL — ABNORMAL HIGH (ref 0–99)
LDL/HDL Ratio: 2.2 {ratio} (ref 0.0–3.2)
Triglycerides: 91 mg/dL (ref 0–149)
VLDL Cholesterol Cal: 16 mg/dL (ref 5–40)

## 2023-04-15 LAB — CMP14+EGFR
ALT: 19 [IU]/L (ref 0–32)
AST: 21 [IU]/L (ref 0–40)
Albumin: 4.6 g/dL (ref 3.8–4.8)
Alkaline Phosphatase: 114 [IU]/L (ref 44–121)
BUN/Creatinine Ratio: 20 (ref 12–28)
BUN: 21 mg/dL (ref 8–27)
Bilirubin Total: 0.5 mg/dL (ref 0.0–1.2)
CO2: 23 mmol/L (ref 20–29)
Calcium: 10.1 mg/dL (ref 8.7–10.3)
Chloride: 101 mmol/L (ref 96–106)
Creatinine, Ser: 1.07 mg/dL — ABNORMAL HIGH (ref 0.57–1.00)
Globulin, Total: 2.2 g/dL (ref 1.5–4.5)
Glucose: 93 mg/dL (ref 70–99)
Potassium: 3.9 mmol/L (ref 3.5–5.2)
Sodium: 140 mmol/L (ref 134–144)
Total Protein: 6.8 g/dL (ref 6.0–8.5)
eGFR: 56 mL/min/{1.73_m2} — ABNORMAL LOW (ref >59)

## 2023-04-15 LAB — CBC
Hematocrit: 44.8 % (ref 34.0–46.6)
Hemoglobin: 15.1 g/dL (ref 11.1–15.9)
MCH: 31 pg (ref 26.6–33.0)
MCHC: 33.7 g/dL (ref 31.5–35.7)
MCV: 92 fL (ref 79–97)
Platelets: 259 10*3/uL (ref 150–450)
RBC: 4.87 x10E6/uL (ref 3.77–5.28)
RDW: 12.8 % (ref 11.7–15.4)
WBC: 6.2 10*3/uL (ref 3.4–10.8)

## 2023-04-15 LAB — TSH: TSH: 4.3 u[IU]/mL (ref 0.450–4.500)

## 2023-04-15 NOTE — Progress Notes (Signed)
Hi Janeya, kidney function is stable at 1.0 it does look a little bit better than last fall.  Liver function looks great.  Total cholesterol and LDL are elevated.  Your cardiovascular risk is still extremely high so currently your calculation is 13% risk of heart attack or stroke in the next 10 years.  Anything 10% or higher is highly recommended for you to be taking a statin.  I know you have previously tried the Crestor.  I did not have that when listed as an intolerance or an allergy did you have a concern with that 1?  You would still benefit from taking a statin even if you are not able to take it every day.  Thyroid level is stable around 4.3.  Blood count is normal.  The 10-year ASCVD risk score (Arnett DK, et al., 2019) is: 13.5%   Values used to calculate the score:     Age: 72 years     Sex: Female     Is Non-Hispanic African American: No     Diabetic: No     Tobacco smoker: No     Systolic Blood Pressure: 126 mmHg     Is BP treated: Yes     HDL Cholesterol: 59 mg/dL     Total Cholesterol: 204 mg/dL

## 2023-04-15 NOTE — Progress Notes (Signed)
HI Stephanie Stone,  Your calcium sore is 12.5. Stephanie Stone is the 46th percentile for you age, race and sex.  Based on this is, it recommended to start a statin ( cholesterol lowering medicine).

## 2023-04-18 ENCOUNTER — Encounter: Payer: Self-pay | Admitting: Family Medicine

## 2023-05-02 NOTE — Progress Notes (Signed)
Please see if she is okay with taking statin. Also they did did do an over read on the surrounding areas of the heart and did note the chronic bronchiectasis but said it was very similar to the prior chest CT that was on file.

## 2023-05-10 ENCOUNTER — Ambulatory Visit: Payer: Medicare Other | Admitting: Obstetrics and Gynecology

## 2023-05-22 ENCOUNTER — Encounter: Payer: Self-pay | Admitting: Family Medicine

## 2023-05-22 DIAGNOSIS — L304 Erythema intertrigo: Secondary | ICD-10-CM

## 2023-05-23 MED ORDER — CLOTRIMAZOLE-BETAMETHASONE 1-0.05 % EX CREA
TOPICAL_CREAM | Freq: Two times a day (BID) | CUTANEOUS | 0 refills | Status: DC
Start: 2023-05-23 — End: 2023-06-20

## 2023-06-20 ENCOUNTER — Other Ambulatory Visit: Payer: Self-pay | Admitting: Family Medicine

## 2023-06-20 ENCOUNTER — Ambulatory Visit: Payer: Self-pay | Admitting: Family Medicine

## 2023-06-20 ENCOUNTER — Ambulatory Visit (INDEPENDENT_AMBULATORY_CARE_PROVIDER_SITE_OTHER): Payer: Medicare Other | Admitting: Family Medicine

## 2023-06-20 ENCOUNTER — Encounter: Payer: Self-pay | Admitting: Family Medicine

## 2023-06-20 VITALS — Ht 62.0 in | Wt 150.0 lb

## 2023-06-20 VITALS — BP 136/78 | HR 103 | Temp 98.4°F | Ht 61.3 in | Wt 150.0 lb

## 2023-06-20 DIAGNOSIS — Z0000 Encounter for general adult medical examination without abnormal findings: Secondary | ICD-10-CM | POA: Diagnosis not present

## 2023-06-20 DIAGNOSIS — Z1231 Encounter for screening mammogram for malignant neoplasm of breast: Secondary | ICD-10-CM | POA: Diagnosis not present

## 2023-06-20 DIAGNOSIS — Z13820 Encounter for screening for osteoporosis: Secondary | ICD-10-CM | POA: Diagnosis not present

## 2023-06-20 DIAGNOSIS — R051 Acute cough: Secondary | ICD-10-CM

## 2023-06-20 DIAGNOSIS — M8580 Other specified disorders of bone density and structure, unspecified site: Secondary | ICD-10-CM | POA: Insufficient documentation

## 2023-06-20 DIAGNOSIS — J479 Bronchiectasis, uncomplicated: Secondary | ICD-10-CM

## 2023-06-20 HISTORY — DX: Encounter for general adult medical examination without abnormal findings: Z00.00

## 2023-06-20 LAB — POC COVID19 BINAXNOW: SARS Coronavirus 2 Ag: NEGATIVE

## 2023-06-20 MED ORDER — PREDNISONE 20 MG PO TABS: 40.0000 mg | ORAL_TABLET | Freq: Every day | ORAL | 0 refills | Status: DC

## 2023-06-20 MED ORDER — DOXYCYCLINE HYCLATE 100 MG PO TABS: 100.0000 mg | ORAL_TABLET | Freq: Two times a day (BID) | ORAL | 0 refills | Status: DC

## 2023-06-20 NOTE — Progress Notes (Signed)
Subjective:   Stephanie Stone is a 72 y.o. female who presents for Medicare Annual (Subsequent) preventive examination.  Visit Complete: Virtual I connected with  Stephanie Stone on 06/20/23 by a audio enabled telemedicine application and verified that I am speaking with the correct person using two identifiers.  Patient Location: Home  Provider Location: Office/Clinic  I discussed the limitations of evaluation and management by telemedicine. The patient expressed understanding and agreed to proceed.  Vital Signs: Because this visit was a virtual/telehealth visit, some criteria may be missing or patient reported. Any vitals not documented were not able to be obtained and vitals that have been documented are patient reported.  Patient Medicare AWV questionnaire was completed by the patient on 06/20/23; I have confirmed that all information answered by patient is correct and no changes since this date.  Cardiac Risk Factors include: advanced age (>53men, >26 women);hypertension;dyslipidemia     Objective:    Today's Vitals   06/20/23 0918  Weight: 150 lb (68 kg)  Height: 5\' 2"  (1.575 m)   Body mass index is 27.44 kg/m.     06/20/2023    9:26 AM 06/14/2022    9:03 AM 05/22/2021   10:48 AM 07/31/2019   11:07 AM 05/17/2018    9:03 AM 05/02/2017   10:25 AM  Advanced Directives  Does Patient Have a Medical Advance Directive? Yes Yes No No No No  Type of Estate agent of [place];Living will Living will      Does patient want to make changes to medical advance directive? No - Patient declined No - Patient declined      Would patient like information on creating a medical advance directive?   No - Patient declined No - Patient declined Yes (MAU/Ambulatory/Procedural Areas - Information given) Yes (MAU/Ambulatory/Procedural Areas - Information given)    Current Medications (verified) Outpatient Encounter Medications as of 06/20/2023  Medication Sig   aspirin 81 MG EC tablet  Take 81 mg by mouth daily.   Biotin 1000 MCG tablet Take 1,000 mcg by mouth daily.   cholecalciferol (VITAMIN D3) 25 MCG (1000 UNIT) tablet Take 1,000 Units by mouth daily.   clotrimazole-betamethasone (LOTRISONE) cream Apply topically 2 (two) times daily.   fluocinonide (LIDEX) 0.05 % external solution Apply 1 application topically at bedtime.   fluticasone (FLONASE) 50 MCG/ACT nasal spray SHAKE LIQUID AND USE 2 SPRAYS IN EACH NOSTRIL DAILY   gabapentin (NEURONTIN) 100 MG capsule Take 1 capsule (100 mg total) by mouth 3 (three) times daily.   hydrochlorothiazide (HYDRODIURIL) 25 MG tablet Take 1 tablet (25 mg total) by mouth daily.   MINOXIDIL, TOPICAL, 5 % SOLN Apply 1 application topically at bedtime.   rosuvastatin (CRESTOR) 5 MG tablet Take 1 tablet (5 mg total) by mouth at bedtime as needed.   sodium chloride HYPERTONIC 3 % nebulizer solution Take 4 mLs by nebulization as needed.   vitamin E 200 UNIT capsule Take 200 Units by mouth daily.   Zinc 22.5 MG TABS Take by mouth daily.   Estradiol 10 MCG TABS vaginal tablet Place 1 tablet (10 mcg total) vaginally 2 (two) times a week. Place 1 tab nightly for two weeks then twice a week after (Patient not taking: Reported on 06/20/2023)   No facility-administered encounter medications on file as of 06/20/2023.    Allergies (verified) Sulfa antibiotics, Azithromycin, Codeine, Elemental sulfur, Lovastatin, and Miconazole nitrate   History: Past Medical History:  Diagnosis Date   Allergy  Alopecia    areata   Anxiety    situational   Depression    Hyperlipidemia    Hypertension    Lung infiltrate 07/12/2008   Resolved on its own without biopsy   Menopause, premature    Migraines    MVP (mitral valve prolapse)    Past Surgical History:  Procedure Laterality Date   ABDOMINAL HYSTERECTOMY  07/12/1977   endometriosis   APPENDECTOMY     Family History  Problem Relation Age of Onset   Heart attack Mother    Hypertension Mother     Coronary artery disease Mother 55   Liver cancer Mother    Cancer Mother    Heart disease Mother    Heart attack Father    Early death Father    Heart disease Father    Heart attack Brother 44   Stroke Brother    Cancer Brother    COPD Brother    Heart disease Brother    Hypertension Brother    Heart disease Brother    Cancer Brother    COPD Brother    Hypertension Brother    Social History   Socioeconomic History   Marital status: Married    Spouse name: Sam   Number of children: 2   Years of education: 14   Highest education level: Tax adviser degree: occupational, Scientist, product/process development, or vocational program  Occupational History   Occupation: retired    Comment: Airline pilot  Tobacco Use   Smoking status: Never   Smokeless tobacco: Never  Vaping Use   Vaping status: Never Used  Substance and Sexual Activity   Alcohol use: Not Currently    Comment: maybe once a month   Drug use: No   Sexual activity: Not Currently  Other Topics Concern   Not on file  Social History Narrative   Lives with her husband. She has two children. She enjoys painting and sewing.   Social Determinants of Health   Financial Resource Strain: Low Risk  (06/20/2023)   Overall Financial Resource Strain (CARDIA)    Difficulty of Paying Living Expenses: Not hard at all  Food Insecurity: No Food Insecurity (06/20/2023)   Hunger Vital Sign    Worried About Running Out of Food in the Last Year: Never true    Ran Out of Food in the Last Year: Never true  Transportation Needs: No Transportation Needs (06/20/2023)   PRAPARE - Administrator, Civil Service (Medical): No    Lack of Transportation (Non-Medical): No  Physical Activity: Insufficiently Active (06/20/2023)   Exercise Vital Sign    Days of Exercise per Week: 1 day    Minutes of Exercise per Session: 40 min  Stress: Stress Concern Present (06/20/2023)   [employer] of Occupational Health - Occupational Stress Questionnaire     Feeling of Stress : To some extent  Social Connections: Socially Integrated (06/20/2023)   Social Connection and Isolation Panel [NHANES]    Frequency of Communication with Friends and Family: More than three times a week    Frequency of Social Gatherings with Friends and Family: Twice a week    Attends Religious Services: More than 4 times per year    Active Member of [employer] or Organizations: Yes    Attends Engineer, structural: More than 4 times per year    Marital Status: Married    Tobacco Counseling Counseling given: Not Answered   Clinical Intake:  Pre-visit preparation completed: No  Pain : No/denies pain  BMI - recorded: 27.4 Nutritional Status: BMI 25 -29 Overweight Nutritional Risks: None Diabetes: No  How often do you need to have someone help you when you read instructions, pamphlets, or other written materials from your doctor or pharmacy?: 1 - Never What is the last grade level you completed in school?: 14  Interpreter Needed?: No      Activities of Daily Living    06/20/2023    9:20 AM  In your present state of health, do you have any difficulty performing the following activities:  Hearing? 0  Vision? 0  Difficulty concentrating or making decisions? 0  Walking or climbing stairs? 0  Dressing or bathing? 0  Doing errands, shopping? 0  Preparing Food and eating ? N  Using the Toilet? N  In the past six months, have you accidently leaked urine? Y  Do you have problems with loss of bowel control? N  Managing your Finances? N  Housekeeping or managing your Housekeeping? N    Patient Care Team: Agapito Games, MD as PCP - General (Family Medicine) Gabriel Carina, [facility] (Inactive) as Pharmacist (Pharmacist) Steele Berg, pulmonary UroGYN provider in [place], unsure of name    Indicate any recent Medical Services you may have received from other than [facility] providers in the past year (date may be approximate).     Assessment:   This  is a routine wellness examination for Sushmita.  Hearing/Vision screen Hearing Screening - Comments:: Unable to test, grossly intact. Vision Screening - Comments:: Unable to test, grossly intact, wears glasses.    Goals Addressed             This Visit's Progress    Exercise 3x per week (30 min per time)       3 times per week      Depression Screen    06/20/2023    9:25 AM 01/19/2023    1:43 PM 11/19/2022    1:11 PM 06/14/2022    9:05 AM 06/01/2022   11:09 AM 03/02/2022   11:08 AM 12/30/2021    2:19 PM  PHQ 2/9 Scores  PHQ - 2 Score 0 0 0 0 0 0 0  PHQ- 9 Score  1  0       Fall Risk    06/20/2023    9:27 AM 11/19/2022    1:11 PM 06/14/2022    9:05 AM 06/13/2022    8:08 PM 06/01/2022   11:09 AM  Fall Risk   Falls in the past year? 0 0 0 0 0  Number falls in past yr: 0 0 0 0 0  Injury with Fall? 0 0 0  0  Risk for fall due to : No Fall Risks No Fall Risks No Fall Risks  No Fall Risks  Follow up  Falls evaluation completed Falls evaluation completed  Falls evaluation completed    MEDICARE RISK AT HOME: Medicare Risk at Home Any stairs in or around the home?: Yes If so, are there any without handrails?: Yes Home free of loose throw rugs in walkways, pet beds, electrical cords, etc?: No Adequate lighting in your home to reduce risk of falls?: Yes Life alert?: No Use of a cane, walker or w/c?: No Grab bars in the bathroom?: No Shower chair or bench in shower?: No Elevated toilet seat or a handicapped toilet?: Yes  TIMED UP AND GO:  Was the test performed?  No    Cognitive Function:        06/20/2023  9:27 AM 06/14/2022    9:09 AM 05/22/2021   10:56 AM 07/31/2019   11:17 AM 05/17/2018    9:06 AM  6CIT Screen  What Year? 0 points 0 points 0 points 0 points 0 points  What month? 0 points 0 points 0 points 0 points 0 points  What time? 0 points 0 points 0 points 0 points 0 points  Count back from 20 0 points 0 points 0 points 0 points 0 points  Months in reverse 0  points 0 points 0 points 0 points 0 points  Repeat phrase 0 points 0 points 0 points 0 points 2 points  Total Score 0 points 0 points 0 points 0 points 2 points    Immunizations Immunization History  Administered Date(s) Administered   Fluad Quad(high Dose 65+) 05/07/2019, 05/07/2020, 05/19/2021, 04/22/2022   Fluad Trivalent(High Dose 65+) 04/12/2023   Influenza, High Dose Seasonal PF 05/02/2017   Influenza,inj,Quad PF,6+ Mos 04/20/2013, 05/27/2016, 05/17/2018   Influenza-Unspecified 04/03/2014   PFIZER Comirnaty(Gray Top)Covid-19 Tri-Sucrose Vaccine 10/29/2020   PFIZER(Purple Top)SARS-COV-2 Vaccination 08/17/2019, 09/10/2019   PNEUMOCOCCAL CONJUGATE-20 01/20/2022   Pneumococcal Conjugate-13 05/02/2017   Pneumococcal Polysaccharide-23 05/17/2018   Td 07/13/2003   Tdap 12/12/2015    TDAP status: Up to date  Flu Vaccine status: Up to date  Pneumococcal vaccine status: Up to date  Covid-19 vaccine status: Declined, Education has been provided regarding the importance of this vaccine but patient still declined. Advised may receive this vaccine at local pharmacy or Health Dept.or vaccine clinic. Aware to provide a copy of the vaccination record if obtained from local pharmacy or Health Dept. Verbalized acceptance and understanding.  Qualifies for Shingles Vaccine? Yes   Zostavax completed No   Shingrix Completed?: No.    Education has been provided regarding the importance of this vaccine. Patient has been advised to call insurance company to determine out of pocket expense if they have not yet received this vaccine. Advised may also receive vaccine at local pharmacy or Health Dept. Verbalized acceptance and understanding.  Screening Tests Health Maintenance  Topic Date Due   Zoster Vaccines- Shingrix (1 of 2) Never done   COVID-19 Vaccine (4 - 2023-24 season) 03/13/2023   Fecal DNA (Cologuard)  12/16/2023   MAMMOGRAM  03/08/2024   Medicare Annual Wellness (AWV)  06/19/2024    DTaP/Tdap/Td (3 - Td or Tdap) 12/11/2025   Pneumonia Vaccine 42+ Years old  Completed   INFLUENZA VACCINE  Completed   DEXA SCAN  Completed   Hepatitis C Screening  Completed   HPV VACCINES  Aged Out    Health Maintenance  Health Maintenance Due  Topic Date Due   Zoster Vaccines- Shingrix (1 of 2) Never done   COVID-19 Vaccine (4 - 2023-24 season) 03/13/2023    Colorectal cancer screening: Type of screening: Cologuard. Completed 12/15/20. Repeat every 3 years  Mammogram status: Completed 03/10/22. Repeat every year. Needs this done.   Bone Density status: Completed 05/17/2018. Results reflect: Bone density results: OSTEOPENIA. Repeat every 2 years.Needs referral   Lung Cancer Screening: (Low Dose CT Chest recommended if Age 19-80 years, 20 pack-year currently smoking OR have quit w/in 15years.) does not qualify.   Lung Cancer Screening Referral: n/a  Additional Screening:  Hepatitis C Screening: does qualify; Completed 10/07/14  Vision Screening: Recommended annual ophthalmology exams for early detection of glaucoma and other disorders of the eye. Is the patient up to date with their annual eye exam?  No  Who is the provider or what  is the name of the office in which the patient attends annual eye exams? [facility]  If pt is not established with a provider, would they like to be referred to a provider to establish care? No .   Dental Screening: Recommended annual dental exams for proper oral hygiene  Diabetic Foot Exam: n/a   Community Resource Referral / Chronic Care Management: CRR required this visit?  No   CCM required this visit?  No     Plan:     I have personally reviewed and noted the following in the patient's chart:   Medical and social history Use of alcohol, tobacco or illicit drugs  Current medications and supplements including opioid prescriptions. Patient is not currently taking opioid prescriptions. Functional ability and status Nutritional  status Physical activity Advanced directives List of other physicians Hospitalizations, surgeries, and ER visits in previous 12 months Vitals Screenings to include cognitive, depression, and falls Referrals and appointments  In addition, I have reviewed and discussed with patient certain preventive protocols, quality metrics, and best practice recommendations. A written personalized care plan for preventive services as well as general preventive health recommendations were provided to patient.     Novella Olive, FNP   06/20/2023   After Visit Summary: (MyChart) Due to this being a telephonic visit, the after visit summary with patients personalized plan was offered to patient via MyChart   Follow-up with PCP as scheduled. Discuss with PCP bone density.  Recommend Shingrix vaccine at local pharmacy. Referrals: screening  mammogram

## 2023-06-20 NOTE — Patient Instructions (Signed)
Will treat with antibiotic and steroid.  If you are not feeling at least some better by the end of the week please let me know.

## 2023-06-20 NOTE — Telephone Encounter (Signed)
Chief Complaint: Non productive cough Symptoms: cough, runny nose, hoarse voice, wheezing/"rattling", headaches Frequency: x 11 days Pertinent Negatives: Patient denies SOB, fevers, sputum/productive cough, swelling in lower extremities. Disposition: [] ED /[] Urgent Care (no appt availability in office) / [x] Appointment(In office/virtual)/ []  [facility]/ [] Home Care/ [] Refused Recommended Disposition /[] [facility]/ []  Follow-up with PCP Additional Notes: Pt c/o nonproductive cough x 11 days. Pt states she has a "chronic hacky cough" due to her bronchiectasis. Pt states this has felt different. Pt c/o runny nose, cough, chest soreness after coughing. Pt reports wheezing and "rattling" sounds with cough toward the beginning of symptoms but states it has improved.  Pt states she has been using her nebulizer daily, OTC cough syrup and Delsym.   Reason for Disposition  [1] Patient also has allergy symptoms (e.g., itchy eyes, clear nasal discharge, postnasal drip) AND [2] they are acting up  Answer Assessment - Initial Assessment Questions 1. ONSET: "When did the cough begin?"      X 11 days; the day after or 2 days after Thanksgiving.  2. SEVERITY: "How bad is the cough today?"      Pt reports today it is a 6-7/10 for severity of cough. "Originally I was just non stop coughing and its better than it was", pt states it was worsened at night.  3. SPUTUM: "Describe the color of your sputum" (none, dry cough; clear, white, yellow, green)     None.  4. HEMOPTYSIS: "Are you coughing up any blood?" If so ask: "How much?" (flecks, streaks, tablespoons, etc.)     Denies coughing up blood.  5. DIFFICULTY BREATHING: "Are you having difficulty breathing?" If Yes, ask: "How bad is it?" (e.g., mild, moderate, severe)    - MILD: No SOB at rest, mild SOB with walking, speaks normally in sentences, can lie down, no retractions, pulse < 100.    - MODERATE: SOB at rest, SOB with minimal  exertion and prefers to sit, cannot lie down flat, speaks in phrases, mild retractions, audible wheezing, pulse 100-120.    - SEVERE: Very SOB at rest, speaks in single words, struggling to breathe, sitting hunched forward, retractions, pulse > 120      Pt states she doesn't feel SOB but states sometimes "I get winded going up and down the steps".  6. FEVER: "Do you have a fever?" If Yes, ask: "What is your temperature, how was it measured, and when did it start?"     Denies any fevers.  7. CARDIAC HISTORY: "Do you have any history of heart disease?" (e.g., heart attack, congestive heart failure)      High blood pressure.  8. LUNG HISTORY: "Do you have any history of lung disease?"  (e.g., pulmonary embolus, asthma, emphysema)     Bronchiectasis   9. PE RISK FACTORS: "Do you have a history of blood clots?" (or: recent major surgery, recent prolonged travel, bedridden)     No.  10. OTHER SYMPTOMS: "Do you have any other symptoms?" (e.g., runny nose, wheezing, chest pain)       Pt states from coughing "kinda like a soreness" in her chest and states that has improved as the cough has become less severe/frequent. Pt states earlier on with symptoms she heard some rattling and wheezing and states she does not hear it now. Pt reports runny nose. Pt states in the past week she had elevated BP 193/90 something and took an extra dose of her BP med and felt dizzy. She states that  the elevated BP and dizziness have resolved.  11. TRAVEL: "Have you traveled out of the country in the last month?" (e.g., travel history, exposures)       Denies travel or known exposure to flu/cold/pneumonia/covid  Protocols used: Cough - Acute Non-Productive-A-AH

## 2023-06-20 NOTE — Telephone Encounter (Signed)
Copied from CRM [id number]. Topic: Clinical - Medical Advice >> Jun 20, 2023  9:00 AM Maxwell Marion wrote: Reason for CRM: Pt has a lung issue and has had a cough since thanksgiving but isn't running a fever. Would like a nurse call to talk about her symptoms and advise her of whether or not she should be seen

## 2023-06-20 NOTE — Progress Notes (Signed)
Acute Office Visit  Subjective:     Patient ID: Stephanie Stone, female    DOB: 10-26-1950, 72 y.o.   MRN: [id number]  Chief Complaint  Patient presents with   Cough    She reports that she has had this cough since thanksgiving and has been taking Delsym, cough drops, and using her neb treatment 2 x daily      HPI Patient is in today for Cough x 2 weeks.  No fever, chills or sweats.  No known sick contacts.  She has a history of bronchiectasis.  She said some runny nose but no thick nasal congestion.  No sore throat no ear pain.  She has been using her nebulizer treatment twice a day and does get some improvement in her symptoms for a few hours after each treatment.  ROS      Objective:    BP (!) 148/80   Pulse (!) 103   Temp 98.4 F (36.9 C)   Ht 5' 1.3" (1.557 m)   Wt 150 lb (68 kg)   SpO2 100%   BMI 28.07 kg/m    Physical Exam Constitutional:      Appearance: Normal appearance.  HENT:     Head: Normocephalic and atraumatic.     Right Ear: Tympanic membrane, ear canal and external ear normal. There is no impacted cerumen.     Left Ear: Tympanic membrane, ear canal and external ear normal. There is no impacted cerumen.     Nose: Nose normal.     Mouth/Throat:     Pharynx: Oropharynx is clear.  Eyes:     Conjunctiva/sclera: Conjunctivae normal.  Cardiovascular:     Rate and Rhythm: Normal rate and regular rhythm.  Pulmonary:     Effort: Pulmonary effort is normal.     Breath sounds: Rhonchi present.     Comments: Rhonchi on the left lung Musculoskeletal:     Cervical back: Neck supple. No tenderness.  Lymphadenopathy:     Cervical: No cervical adenopathy.  Skin:    General: Skin is warm and dry.  Neurological:     Mental Status: She is alert and oriented to person, place, and time.  Psychiatric:        Mood and Affect: Mood normal.     Results for orders placed or performed in visit on 06/20/23  POC COVID-19  Result Value Ref Range   SARS Coronavirus 2  Ag Negative Negative        Assessment & Plan:   Problem List Items Addressed This Visit       Respiratory   Bronchiectasis (HCC) - Primary   Other Visit Diagnoses     Acute cough       Relevant Orders   POC COVID-19 (Completed)      Bronchiectasis with cough x 2 weeks-she does have some rhonchi on the left lung today.  Will treat with prednisone and doxycycline continue with twice a day nebulizer treatment.  Meds ordered this encounter  Medications   predniSONE (DELTASONE) 20 MG tablet    Sig: Take 2 tablets (40 mg total) by mouth daily with breakfast.    Dispense:  10 tablet    Refill:  0   doxycycline (VIBRA-TABS) 100 MG tablet    Sig: Take 1 tablet (100 mg total) by mouth 2 (two) times daily.    Dispense:  14 tablet    Refill:  0    Return if symptoms worsen or fail to improve.  Santina Evans  Linford Arnold, MD

## 2023-06-21 NOTE — Telephone Encounter (Signed)
Patient seen in office 06/20/23.

## 2023-07-19 ENCOUNTER — Inpatient Hospital Stay (HOSPITAL_BASED_OUTPATIENT_CLINIC_OR_DEPARTMENT_OTHER): Admission: RE | Admit: 2023-07-19 | Payer: Medicare Other | Source: Ambulatory Visit

## 2023-08-04 DIAGNOSIS — J988 Other specified respiratory disorders: Secondary | ICD-10-CM | POA: Diagnosis not present

## 2023-08-04 DIAGNOSIS — R0609 Other forms of dyspnea: Secondary | ICD-10-CM | POA: Diagnosis not present

## 2023-08-04 DIAGNOSIS — J479 Bronchiectasis, uncomplicated: Secondary | ICD-10-CM | POA: Diagnosis not present

## 2023-08-08 ENCOUNTER — Ambulatory Visit (HOSPITAL_BASED_OUTPATIENT_CLINIC_OR_DEPARTMENT_OTHER)
Admission: RE | Admit: 2023-08-08 | Discharge: 2023-08-08 | Disposition: A | Discharge status: Home / Self Care | Payer: Medicare Other | Source: Ambulatory Visit | Attending: Family Medicine | Admitting: Family Medicine

## 2023-08-08 ENCOUNTER — Encounter (HOSPITAL_BASED_OUTPATIENT_CLINIC_OR_DEPARTMENT_OTHER): Payer: Self-pay

## 2023-08-08 DIAGNOSIS — Z1231 Encounter for screening mammogram for malignant neoplasm of breast: Secondary | ICD-10-CM | POA: Diagnosis not present

## 2023-08-09 ENCOUNTER — Encounter: Payer: Self-pay | Admitting: Family Medicine

## 2023-08-09 ENCOUNTER — Ambulatory Visit: Payer: Medicare Other | Admitting: Obstetrics and Gynecology

## 2023-08-09 NOTE — Progress Notes (Signed)
Please call patient. Normal mammogram.  Repeat in 1 year.

## 2023-08-16 ENCOUNTER — Other Ambulatory Visit: Payer: Self-pay | Admitting: *Deleted

## 2023-08-16 DIAGNOSIS — L304 Erythema intertrigo: Secondary | ICD-10-CM

## 2023-08-16 MED ORDER — CLOTRIMAZOLE-BETAMETHASONE 1-0.05 % EX CREA: TOPICAL_CREAM | Freq: Two times a day (BID) | CUTANEOUS | 0 refills | Status: DC

## 2023-09-15 DIAGNOSIS — Z85828 Personal history of other malignant neoplasm of skin: Secondary | ICD-10-CM | POA: Diagnosis not present

## 2023-09-15 DIAGNOSIS — L111 Transient acantholytic dermatosis [Grover]: Secondary | ICD-10-CM | POA: Diagnosis not present

## 2023-09-15 DIAGNOSIS — R21 Rash and other nonspecific skin eruption: Secondary | ICD-10-CM | POA: Diagnosis not present

## 2023-09-15 DIAGNOSIS — D485 Neoplasm of uncertain behavior of skin: Secondary | ICD-10-CM | POA: Diagnosis not present

## 2023-09-15 DIAGNOSIS — L573 Poikiloderma of Civatte: Secondary | ICD-10-CM | POA: Diagnosis not present

## 2023-09-22 ENCOUNTER — Encounter: Payer: Self-pay | Admitting: Obstetrics and Gynecology

## 2023-09-22 ENCOUNTER — Ambulatory Visit (INDEPENDENT_AMBULATORY_CARE_PROVIDER_SITE_OTHER): Payer: Medicare Other | Admitting: Obstetrics and Gynecology

## 2023-09-22 VITALS — BP 114/78 | HR 81

## 2023-09-22 DIAGNOSIS — N393 Stress incontinence (female) (male): Secondary | ICD-10-CM | POA: Diagnosis not present

## 2023-09-22 DIAGNOSIS — N3281 Overactive bladder: Secondary | ICD-10-CM | POA: Insufficient documentation

## 2023-09-22 DIAGNOSIS — R350 Frequency of micturition: Secondary | ICD-10-CM | POA: Diagnosis not present

## 2023-09-22 DIAGNOSIS — N993 Prolapse of vaginal vault after hysterectomy: Secondary | ICD-10-CM | POA: Diagnosis not present

## 2023-09-22 LAB — POCT URINALYSIS DIPSTICK
Bilirubin, UA: NEGATIVE
Blood, UA: NEGATIVE
Glucose, UA: NEGATIVE
Ketones, UA: NEGATIVE
Leukocytes, UA: NEGATIVE
Nitrite, UA: NEGATIVE
Protein, UA: NEGATIVE
Spec Grav, UA: 1.02 (ref 1.010–1.025)
Urobilinogen, UA: 0.2 U/dL
pH, UA: 5.5 (ref 5.0–8.0)

## 2023-09-22 NOTE — Assessment & Plan Note (Addendum)
 Stage II anterior, Stage I-II posterior, Stage I apical prolapse  - Pt states she is not interested in a pessary.  - We discussed two options for prolapse repair:  1) vaginal repair without mesh - Pros - safer, no mesh complications - Cons - not as strong as mesh repair, higher risk of recurrence  2) laparoscopic repair with mesh - Pros - stronger, better long-term success - Cons - risks of mesh implant (erosion into vagina or bladder, adhering to the rectum, pain) - these risks are lower than with a vaginal mesh but still exist - Recommended vaginal approach without mesh- Anterior and posterior repair with sacrospinous fixation, cystoscopy. She is not sure when she wants to proceed with surgery so will consider her options. Handouts provided for her to review. - If she decides to proceed with surgery, will plan to obtain Pulmonary clearance due to her bronchiectasis

## 2023-09-22 NOTE — Assessment & Plan Note (Signed)
-  We discussed that prolapse repair will only fix her bulge and will not necessarily change the bladder urgency/ spasm symptoms.  - Recommended trying the [facility] since she feels her symptoms are bothersome.

## 2023-09-22 NOTE — Assessment & Plan Note (Signed)
-   We reviewed options for SUI including mesh sling and urethral bulking. She is concerned about possible mesh placement so would prefer urethral bulking if she plans to do a prolapse repair.

## 2023-09-22 NOTE — Progress Notes (Signed)
 Stephanie Stone Return Visit  SUBJECTIVE  History of Present Illness: Stephanie Stone is a 73 y.o. female seen in follow-up for prolapse and mixed incontinence. She was started on gemtesa at last visit.   Pt reports that she did not take gemtesa because she was not really sure she needs it. She feels that her urgency is bothersome, especially when she is out or traveling. She may be interested in surgery for her prolapse and wants to review options.   CMG showed normal sensation, and within normal limits cystometric capacity. Findings positive for stress incontinence, positive for detrusor overactivity.   Past Medical History: Patient  has a past medical history of Allergy, Alopecia, Anxiety, Depression, Encounter for Medicare annual wellness exam (06/20/2023), Hyperlipidemia, Hypertension, Lung infiltrate (07/12/2008), Menopause, premature, Migraines, and MVP (mitral valve prolapse).   Past Surgical History: She  has a past surgical history that includes Appendectomy and Abdominal hysterectomy (07/12/1977).   Medications: She has a current medication list which includes the following prescription(s): aspirin ec, biotin, cholecalciferol, clotrimazole-betamethasone, doxycycline, estradiol, fluocinonide, fluticasone, gabapentin, hydrochlorothiazide, minoxidil (topical), prednisone, rosuvastatin, sodium chloride hypertonic, vitamin e, and zinc.   Allergies: Patient is allergic to sulfa antibiotics, azithromycin, codeine, elemental sulfur, lovastatin, and miconazole nitrate.   Social History: Patient  reports that she has never smoked. She has never used smokeless tobacco. She reports that she does not currently use alcohol. She reports that she does not use drugs.     OBJECTIVE     Physical Exam: Vitals:   09/22/23 1127  BP: 114/78  Pulse: 81   Gen: No apparent distress, A&O x 3.  Detailed Urogynecologic Evaluation:  Normal external genitalia. On speculum, normal vaginal  mucosa. On bimanual, no masses present.  Rectal exam- good sphincter tone, small distal rectocele without enterocele present, no masses, intact perineum  POP-Q  0                                            Aa   0                                           Ba  -5                                              C   3                                            Gh  4                                            Pb  7                                            tvl   -1.5  Ap  -15                                            Bp                                                 D   Results for orders placed or performed in visit on 09/22/23  POCT Urinalysis Dipstick   Collection Time: 09/22/23 12:28 PM  Result Value Ref Range   Color, UA Yellow    Clarity, UA Clear    Glucose, UA Negative Negative   Bilirubin, UA Negative    Ketones, UA Negative    Spec Grav, UA 1.020 1.010 - 1.025   Blood, UA Negative    pH, UA 5.5 5.0 - 8.0   Protein, UA Negative Negative   Urobilinogen, UA 0.2 0.2 or 1.0 E.U./dL   Nitrite, UA Negative    Leukocytes, UA Negative Negative   Appearance     Odor        ASSESSMENT AND PLAN    Stephanie Stone is a 89 y.o. with:  1. Vaginal vault prolapse after hysterectomy   2. SUI (stress urinary incontinence, female)   3. Urinary frequency   4. Overactive bladder     Vaginal vault prolapse after hysterectomy Assessment & Plan: Stage II anterior, Stage I-II posterior, Stage I apical prolapse  - Pt states she is not interested in a pessary.  - We discussed two options for prolapse repair:  1) vaginal repair without mesh - Pros - safer, no mesh complications - Cons - not as strong as mesh repair, higher risk of recurrence  2) laparoscopic repair with mesh - Pros - stronger, better long-term success - Cons - risks of mesh implant (erosion into vagina or bladder, adhering to the rectum, pain) - these risks are lower than  with a vaginal mesh but still exist - Recommended vaginal approach without mesh- Anterior and posterior repair with sacrospinous fixation, cystoscopy. She is not sure when she wants to proceed with surgery so will consider her options. Handouts provided for her to review.   SUI (stress urinary incontinence, female) Assessment & Plan: - We reviewed options for SUI including mesh sling and urethral bulking. She is concerned about possible mesh placement so would prefer urethral bulking if she plans to do a prolapse repair.    Urinary frequency -     POCT urinalysis dipstick  Overactive bladder Assessment & Plan: -We discussed that prolapse repair will only fix her bulge and will not necessarily change the bladder urgency/ spasm symptoms.  - Recommended trying the Gemtesa since she feels her symptoms are bothersome.     Return 1 month for follow up   Stephanie Beards, MD  Time spent: I spent 35 minutes dedicated to the care of this patient on the date of this encounter to include pre-visit review of records, face-to-face time with the patient discussing options and post visit documentation.

## 2023-10-11 ENCOUNTER — Encounter: Payer: Self-pay | Admitting: Family Medicine

## 2023-10-11 ENCOUNTER — Ambulatory Visit (INDEPENDENT_AMBULATORY_CARE_PROVIDER_SITE_OTHER): Payer: Medicare Other | Admitting: Family Medicine

## 2023-10-11 ENCOUNTER — Ambulatory Visit: Attending: Family Medicine

## 2023-10-11 ENCOUNTER — Encounter: Payer: Self-pay | Admitting: Obstetrics and Gynecology

## 2023-10-11 VITALS — BP 126/73 | HR 83 | Ht 61.3 in | Wt 155.1 lb

## 2023-10-11 DIAGNOSIS — R531 Weakness: Secondary | ICD-10-CM

## 2023-10-11 DIAGNOSIS — R7989 Other specified abnormal findings of blood chemistry: Secondary | ICD-10-CM | POA: Diagnosis not present

## 2023-10-11 DIAGNOSIS — R000 Tachycardia, unspecified: Secondary | ICD-10-CM

## 2023-10-11 DIAGNOSIS — M8588 Other specified disorders of bone density and structure, other site: Secondary | ICD-10-CM

## 2023-10-11 DIAGNOSIS — I10 Essential (primary) hypertension: Secondary | ICD-10-CM

## 2023-10-11 NOTE — Progress Notes (Signed)
 Established Patient Office Visit  Subjective  Patient ID: Stephanie Stone, female    DOB: [birth year]  Age: 73 y.o. MRN: [id number]  Chief Complaint  Patient presents with   Blood Pressure Check    HPI  She is here today for several concerns.  She did start her statin back in October and after about 3 weeks of taking it she was standing in the kitchen just doing some light cooking and things and suddenly just felt very weak she said she was not really dizzy or lightheaded she just felt weak enough that she had to sit down after a couple minutes she felt like she recovered and kind of got back up but still just felt a little off.  And then had to sit down again.  She did not really have any more episodes like that until this past month on March 24 she said she woke up early in the morning which is her usual she got up went to the bathroom started to make the bed and then suddenly felt intensely weak again to the point that she was actually worried to walk down her stairs she thought she might not make it.  Again not a dizzy sensation but just feeling extremely weak.  She actually said her hips almost felt like they were tight.  She felt like her heart was pounding the whole episode lasted about 5 minutes she did not feel great afterwards but she did not feel as bad.  She also wanted to let me know that in February she was a passenger in a car when she had some right jaw pain that radiated into the right side of her neck into the right side of her chest again no headache or dizziness with it.  It lasted about 5 minutes.  She says her husband is commented that she just has not been herself.  Normally she is a little bit more active and on the go.    ROS    Objective:     BP 126/73 (BP Location: Left Arm, Patient Position: Sitting, Cuff Size: Normal)   Pulse 83   Ht 5' 1.3" (1.557 m)   Wt 155 lb 1.3 oz (70.3 kg)   SpO2 98%   BMI 29.02 kg/m    Physical Exam Vitals and nursing note  reviewed.  Constitutional:      Appearance: Normal appearance.  HENT:     Head: Normocephalic and atraumatic.  Eyes:     Conjunctiva/sclera: Conjunctivae normal.  Cardiovascular:     Rate and Rhythm: Normal rate and regular rhythm.  Pulmonary:     Effort: Pulmonary effort is normal.     Breath sounds: Normal breath sounds.  Skin:    General: Skin is warm and dry.  Neurological:     Mental Status: She is alert.  Psychiatric:        Mood and Affect: Mood normal.      No results found for any visits on 10/11/23.    The 10-year ASCVD risk score (Arnett DK, et al., 2019) is: 15%    Assessment & Plan:   Problem List Items Addressed This Visit       Cardiovascular and Mediastinum   Essential hypertension - Primary   Pressure looks great today.  If she has another episode just encouraged her to check her blood pressure again at home.      Relevant Orders   CMP14+EGFR   CBC with Differential/Platelet   TSH + free  T4   VITAMIN D 25 Hydroxy (Vit-D Deficiency, Fractures)   Magnesium   B12     Musculoskeletal and Integument   Osteopenia   Since we are getting blood work today we will go ahead and get an up-to-date vitamin D level.  Does take 25 mcg daily.      Relevant Orders   VITAMIN D 25 Hydroxy (Vit-D Deficiency, Fractures)   Other Visit Diagnoses       Weakness       Relevant Orders   CMP14+EGFR   CBC with Differential/Platelet   TSH + free T4   VITAMIN D 25 Hydroxy (Vit-D Deficiency, Fractures)   Magnesium   B12   LONG TERM MONITOR (3-14 DAYS)     Abnormal TSH       Relevant Orders   CMP14+EGFR   CBC with Differential/Platelet   TSH + free T4   VITAMIN D 25 Hydroxy (Vit-D Deficiency, Fractures)   Magnesium   B12     Tachycardia       Relevant Orders   LONG TERM MONITOR (3-14 DAYS)   EKG 12-Lead       Sudden weakness episodes-I really do not think that this is her statin it is very unusual at honestly be a little bit more suspicious of a  cardiac etiology.  She did have carotid Dopplers done back in 2022 which showed a little bit of plaque but nothing significant.  EKG reassuring below.  Will move forward with heart monitor for 2 weeks.  If it happens again please let me know and check vitals as soon as possible but I also want her to sit down and be safe if she is feeling it in that moment.  Okay to hold her statin currently she is really been taking it every other day anyway.  She says that when she does take it she notices that her legs feel wobbly but we can always try a different statin if everything checks out with her heart.  EKG today shows rate of 85 bpm, normal sinus rhythm with a PVC.  No acute ST-T wave changes.  No concerning features will move forward with cardiac monitor.  No follow-ups on file.   I spent 40 minutes on the day of the encounter to include pre-visit record review, face-to-face time with the patient and post visit ordering of test.  Nani Gasser, MD

## 2023-10-11 NOTE — Assessment & Plan Note (Signed)
 Since we are getting blood work today we will go ahead and get an up-to-date vitamin D level.  Does take 25 mcg daily.

## 2023-10-11 NOTE — Assessment & Plan Note (Signed)
 Pressure looks great today.  If she has another episode just encouraged her to check her blood pressure again at home.

## 2023-10-11 NOTE — Progress Notes (Unsigned)
 EP to read.

## 2023-10-12 LAB — CMP14+EGFR
ALT: 17 IU/L (ref 0–32)
AST: 20 IU/L (ref 0–40)
Albumin: 4.4 g/dL (ref 3.8–4.8)
Alkaline Phosphatase: 103 IU/L (ref 44–121)
BUN/Creatinine Ratio: 27 (ref 12–28)
BUN: 30 mg/dL — ABNORMAL HIGH (ref 8–27)
Bilirubin Total: 0.5 mg/dL (ref 0.0–1.2)
CO2: 21 mmol/L (ref 20–29)
Calcium: 10.2 mg/dL (ref 8.7–10.3)
Chloride: 99 mmol/L (ref 96–106)
Creatinine, Ser: 1.11 mg/dL — ABNORMAL HIGH (ref 0.57–1.00)
Globulin, Total: 2.3 g/dL (ref 1.5–4.5)
Glucose: 80 mg/dL (ref 70–99)
Potassium: 4.3 mmol/L (ref 3.5–5.2)
Sodium: 138 mmol/L (ref 134–144)
Total Protein: 6.7 g/dL (ref 6.0–8.5)
eGFR: 53 mL/min/{1.73_m2} — ABNORMAL LOW (ref >59)

## 2023-10-12 LAB — CBC WITH DIFFERENTIAL/PLATELET
Basophils Absolute: 0.1 10*3/uL (ref 0.0–0.2)
Basos: 1 %
EOS (ABSOLUTE): 0.1 10*3/uL (ref 0.0–0.4)
Eos: 2 %
Hematocrit: 45.8 % (ref 34.0–46.6)
Hemoglobin: 15.4 g/dL (ref 11.1–15.9)
Immature Grans (Abs): 0 10*3/uL (ref 0.0–0.1)
Immature Granulocytes: 0 %
Lymphocytes Absolute: 1.3 10*3/uL (ref 0.7–3.1)
Lymphs: 18 %
MCH: 31.5 pg (ref 26.6–33.0)
MCHC: 33.6 g/dL (ref 31.5–35.7)
MCV: 94 fL (ref 79–97)
Monocytes Absolute: 0.6 10*3/uL (ref 0.1–0.9)
Monocytes: 8 %
Neutrophils Absolute: 5.1 10*3/uL (ref 1.4–7.0)
Neutrophils: 71 %
Platelets: 220 10*3/uL (ref 150–450)
RBC: 4.89 x10E6/uL (ref 3.77–5.28)
RDW: 13 % (ref 11.7–15.4)
WBC: 7.2 10*3/uL (ref 3.4–10.8)

## 2023-10-12 LAB — VITAMIN B12: Vitamin B-12: 421 pg/mL (ref 232–1245)

## 2023-10-12 LAB — MAGNESIUM: Magnesium: 2.4 mg/dL — ABNORMAL HIGH (ref 1.6–2.3)

## 2023-10-12 LAB — TSH+FREE T4
Free T4: 1.45 ng/dL (ref 0.82–1.77)
TSH: 3.13 u[IU]/mL (ref 0.450–4.500)

## 2023-10-12 LAB — VITAMIN D 25 HYDROXY (VIT D DEFICIENCY, FRACTURES): Vit D, 25-Hydroxy: 26.7 ng/mL — ABNORMAL LOW (ref 30.0–100.0)

## 2023-10-14 ENCOUNTER — Encounter: Payer: Self-pay | Admitting: Family Medicine

## 2023-10-14 NOTE — Progress Notes (Signed)
 Hi Stephanie Stone, vitamin D is low.  Recommend 25 mcg daily.  If you are already taking that then increase to 50 mcg daily and lets plan to recheck again in 2 to 3 months.  Your magnesium level looks good.  If anything it is a little bit on the higher side but it is okay.  Kidney function is stable at 1.1.  Liver looks good.  Blood count is normal no sign of anemia.  And thyroid looks good.  B12 is normal.

## 2023-10-20 ENCOUNTER — Ambulatory Visit: Admitting: Obstetrics and Gynecology

## 2023-10-20 DIAGNOSIS — J479 Bronchiectasis, uncomplicated: Secondary | ICD-10-CM | POA: Diagnosis not present

## 2023-10-20 DIAGNOSIS — Z20822 Contact with and (suspected) exposure to covid-19: Secondary | ICD-10-CM | POA: Diagnosis not present

## 2023-10-20 DIAGNOSIS — R0609 Other forms of dyspnea: Secondary | ICD-10-CM | POA: Diagnosis not present

## 2023-10-20 DIAGNOSIS — R059 Cough, unspecified: Secondary | ICD-10-CM | POA: Diagnosis not present

## 2023-10-20 DIAGNOSIS — J069 Acute upper respiratory infection, unspecified: Secondary | ICD-10-CM | POA: Diagnosis not present

## 2023-10-20 DIAGNOSIS — J988 Other specified respiratory disorders: Secondary | ICD-10-CM | POA: Diagnosis not present

## 2023-10-20 DIAGNOSIS — J329 Chronic sinusitis, unspecified: Secondary | ICD-10-CM | POA: Diagnosis not present

## 2023-10-26 DIAGNOSIS — L821 Other seborrheic keratosis: Secondary | ICD-10-CM | POA: Diagnosis not present

## 2023-10-26 DIAGNOSIS — L111 Transient acantholytic dermatosis [Grover]: Secondary | ICD-10-CM | POA: Diagnosis not present

## 2023-10-28 DIAGNOSIS — R000 Tachycardia, unspecified: Secondary | ICD-10-CM | POA: Diagnosis not present

## 2023-10-28 DIAGNOSIS — R531 Weakness: Secondary | ICD-10-CM | POA: Diagnosis not present

## 2023-10-29 DIAGNOSIS — R531 Weakness: Secondary | ICD-10-CM | POA: Diagnosis not present

## 2023-10-29 DIAGNOSIS — R000 Tachycardia, unspecified: Secondary | ICD-10-CM

## 2023-10-31 ENCOUNTER — Encounter: Payer: Self-pay | Admitting: Family Medicine

## 2023-10-31 ENCOUNTER — Ambulatory Visit: Payer: Self-pay

## 2023-10-31 NOTE — Progress Notes (Signed)
 HI Stephaniemarie, great news heart monitor shows no arrhythmias.  Average heart rate was around 76 bpm no atrial fibrillation which is wonderful.  You had a few rare supraventricular beats but these are not concerning and there were no symptom triggered episodes which again is very reassuring that the heart looks good.

## 2023-10-31 NOTE — Telephone Encounter (Signed)
 Copied from CRM [id number]. Topic: Clinical - Red Word Triage >> Oct 31, 2023 12:02 PM Blair Bumpers wrote: Red Word that prompted transfer to Nurse Triage: Patient states she is having pain in her left leg & hip. States it started Wednesday evening & it radiates down her leg & when she's up on her feet is when the pain is the worse.  Chief Complaint: left leg and hip pain Symptoms: pain Frequency: since Wednesday Pertinent Negatives: Patient denies fever, calf pain, numbness, weakness Disposition: [] ED /[] Urgent Care (no appt availability in office) / [x] Appointment(In office/virtual)/ []  [facility]/ [] Home Care/ [] Refused Recommended Disposition /[] [facility]/ []  Follow-up with PCP Additional Notes: per protocol apt made for Wednesday; care advice given, denies questions; instructed to go to ER if becomes worse.   Reason for Disposition  [1] SEVERE pain (e.g., excruciating, unable to do any normal activities) AND [2] not improved after 2 hours of pain medicine  Answer Assessment - Initial Assessment Questions 1. ONSET: "When did the pain start?"      The other day during a pedicure, had back massager on and developed pain on Wednesday. 2. LOCATION: "Where is the pain located?"      Left leg and hip 3. PAIN: "How bad is the pain?"    (Scale 1-10; or mild, moderate, severe)   -  MILD (1-3): doesn't interfere with normal activities    -  MODERATE (4-7): interferes with normal activities (e.g., work or school) or awakens from sleep, limping    -  SEVERE (8-10): excruciating pain, unable to do any normal activities, unable to walk     10/10 4. WORK OR EXERCISE: "Has there been any recent work or exercise that involved this part of the body?"      denies 5. CAUSE: "What do you think is causing the leg pain?"     unknown 6. OTHER SYMPTOMS: "Do you have any other symptoms?" (e.g., chest pain, back pain, breathing difficulty, swelling, rash, fever, numbness, weakness)      Back pain 7. PREGNANCY: "Is there any chance you are pregnant?" "When was your last menstrual period?"     na  Protocols used: Leg Pain-A-AH

## 2023-10-31 NOTE — Telephone Encounter (Signed)
 Patient scheduled for 11/01/23 with Dr. Greer Leak

## 2023-11-01 ENCOUNTER — Ambulatory Visit (INDEPENDENT_AMBULATORY_CARE_PROVIDER_SITE_OTHER): Admitting: Family Medicine

## 2023-11-01 ENCOUNTER — Encounter: Payer: Self-pay | Admitting: Family Medicine

## 2023-11-01 VITALS — BP 134/81 | HR 98 | Ht 61.3 in | Wt 151.0 lb

## 2023-11-01 DIAGNOSIS — M5432 Sciatica, left side: Secondary | ICD-10-CM | POA: Diagnosis not present

## 2023-11-01 DIAGNOSIS — M25552 Pain in left hip: Secondary | ICD-10-CM

## 2023-11-01 MED ORDER — PREDNISONE 20 MG PO TABS: 40.0000 mg | ORAL_TABLET | Freq: Every day | ORAL | 0 refills | Status: DC

## 2023-11-01 MED ORDER — CELECOXIB 100 MG PO CAPS
100.0000 mg | ORAL_CAPSULE | Freq: Two times a day (BID) | ORAL | 1 refills | Status: DC | PRN
Start: 2023-11-01 — End: 2023-11-17

## 2023-11-01 NOTE — Progress Notes (Signed)
   Acute Office Visit  Subjective:     Patient ID: Stephanie Stone, female    DOB: 1951/06/07, 73 y.o.   MRN: [id number]  Chief Complaint  Patient presents with   Leg Pain    HPI Patient is in today for left leg and hip pain she says that it is on that lateral hip area and sometimes radiates down towards her leg sometimes she will have a little pain in that left buttocks as well it is intermittent but being up on her feet definitely seems to trigger it.  It has been difficult to go up or down stairs.  No pain while sitting or laying down.  She says it feels similar to when she had bursitis years ago.  ROS      Objective:    BP 134/81   Pulse 98   Ht 5' 1.3" (1.557 m)   Wt 151 lb (68.5 kg)   SpO2 100%   BMI 28.25 kg/m    Physical Exam Vitals reviewed.  Constitutional:      Appearance: Normal appearance.  HENT:     Head: Normocephalic.  Pulmonary:     Effort: Pulmonary effort is normal.  Musculoskeletal:     Comments: Nontender over lumbar spine. Middle tender mid buttock area. Nontender over the bursa.  Hip, knee strength is 5/5. Neg straight leg raise.  Weak abductors.   Neurological:     Mental Status: She is alert and oriented to person, place, and time.  Psychiatric:        Mood and Affect: Mood normal.        Behavior: Behavior normal.     No results found for any visits on 11/01/23.      Assessment & Plan:   Problem List Items Addressed This Visit   None Visit Diagnoses       Left hip pain    -  Primary     Left sciatic nerve pain          Left leg pain starting in the buttock and radiating down to her foot-most consistent with sciatica.  We discussed doing a trial of prednisone  for more acute relief make sure to take with food and water.  After that okay to take Celebrex .  She has been using her husband's of the last couple days and it has been helping some.  If not continuing to improve consider formal physical therapy and possible imaging.  In her  particular case I think it could be coming from the piriformis so I gave her handout with exercises to do specifically for piriformis.  Meds ordered this encounter  Medications   predniSONE  (DELTASONE ) 20 MG tablet    Sig: Take 2 tablets (40 mg total) by mouth daily with breakfast.    Dispense:  10 tablet    Refill:  0   celecoxib  (CELEBREX ) 100 MG capsule    Sig: Take 1 capsule (100 mg total) by mouth 2 (two) times daily as needed.    Dispense:  60 capsule    Refill:  1    No follow-ups on file.  Duaine German, MD

## 2023-11-13 ENCOUNTER — Encounter: Payer: Self-pay | Admitting: Family Medicine

## 2023-11-14 NOTE — Telephone Encounter (Signed)
 Called and left a voice mail message requesting a return call to assist in scheduling appt with DR. Thekkekandam.

## 2023-11-17 ENCOUNTER — Ambulatory Visit (INDEPENDENT_AMBULATORY_CARE_PROVIDER_SITE_OTHER): Admitting: Sports Medicine

## 2023-11-17 ENCOUNTER — Encounter: Payer: Self-pay | Admitting: Sports Medicine

## 2023-11-17 ENCOUNTER — Ambulatory Visit

## 2023-11-17 DIAGNOSIS — M47816 Spondylosis without myelopathy or radiculopathy, lumbar region: Secondary | ICD-10-CM | POA: Diagnosis not present

## 2023-11-17 DIAGNOSIS — M5416 Radiculopathy, lumbar region: Secondary | ICD-10-CM

## 2023-11-17 DIAGNOSIS — M545 Low back pain, unspecified: Secondary | ICD-10-CM | POA: Diagnosis not present

## 2023-11-17 IMAGING — MR MR MRA HEAD W/O CM
1 series · 19 of 48 positions shown · non-contrast
Comparison: 03/23/2021 MRI head with without contrast

CLINICAL DATA: Headache chronic, bilateral temporal pressure

EXAM:
MRA HEAD WITHOUT CONTRAST
TECHNIQUE: Angiographic images of the Circle of Willis were acquired using MRA
technique without intravenous contrast.

[Series 4: [id number]-slab · axial · 0.5mm · 0.43mm/px · z∈[-34,+69]mm · 19 of 217 slices shown]
[im 1/217]
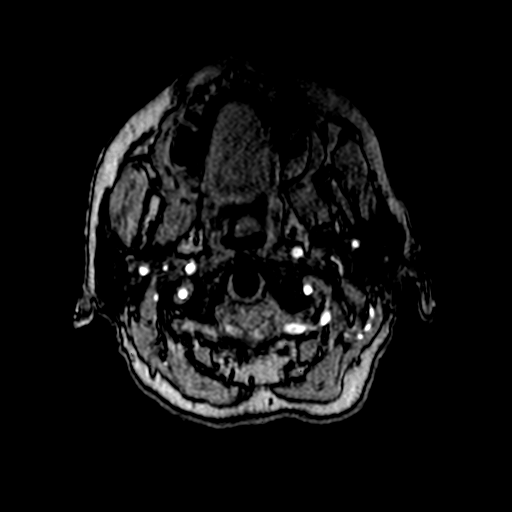
[im 5/217]
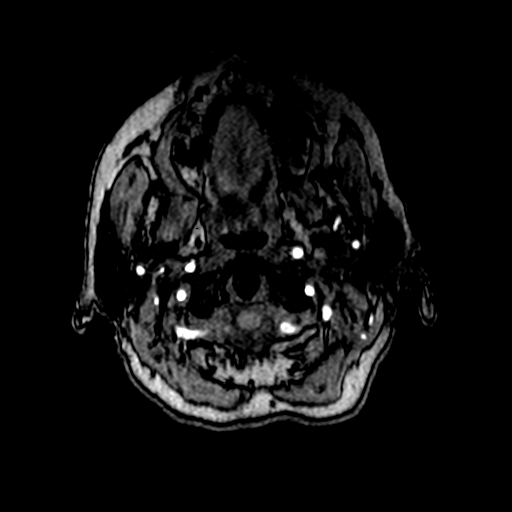
[im 10/217]
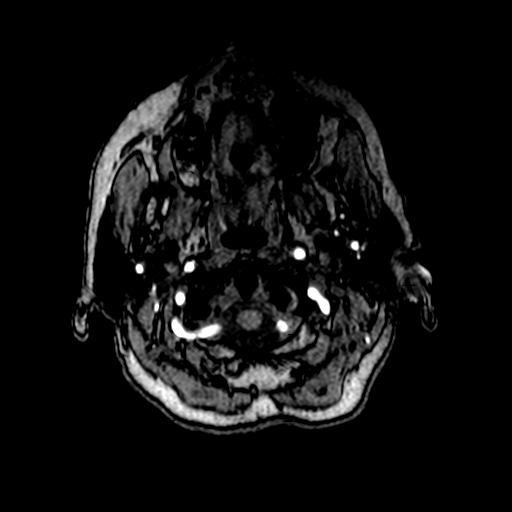
[im 14/217]
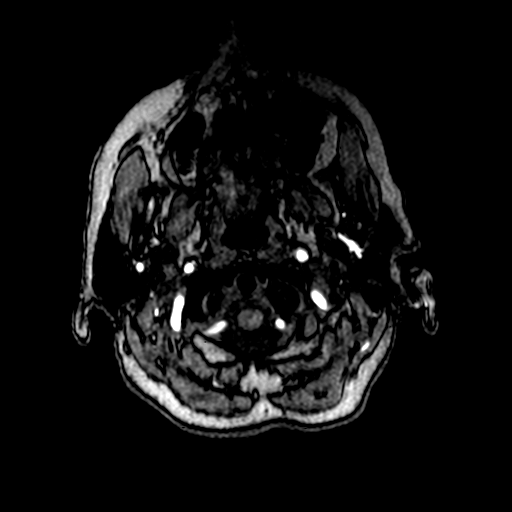
[im 19/217]
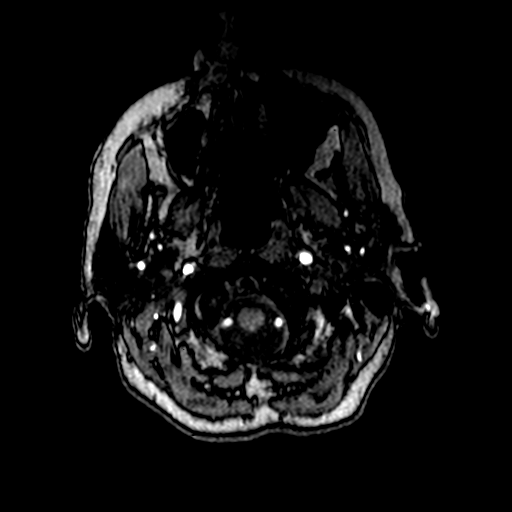
[im 23/217]
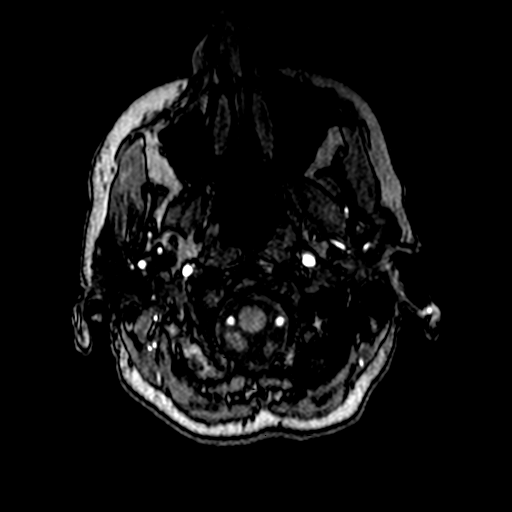
[im 28/217]
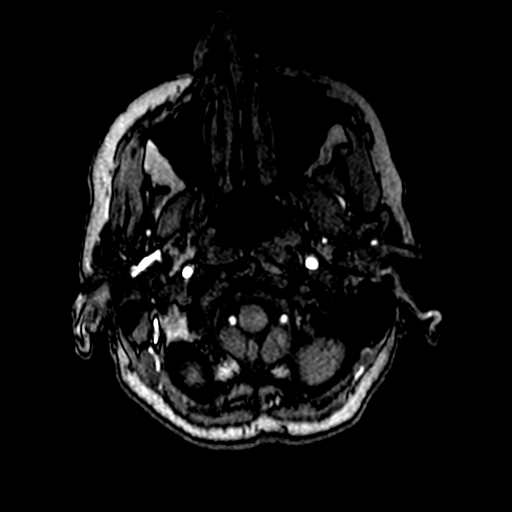
[im 33/217]
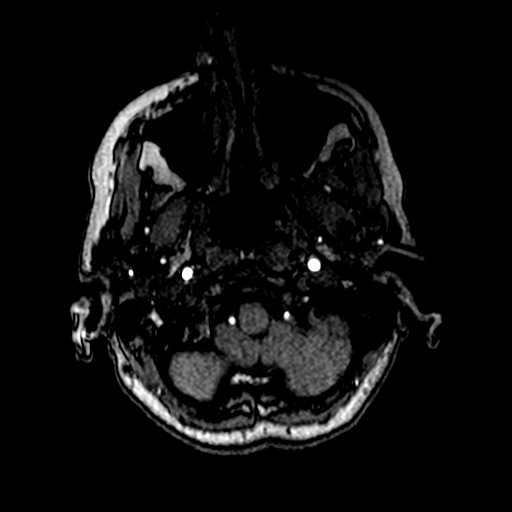
[im 37/217]
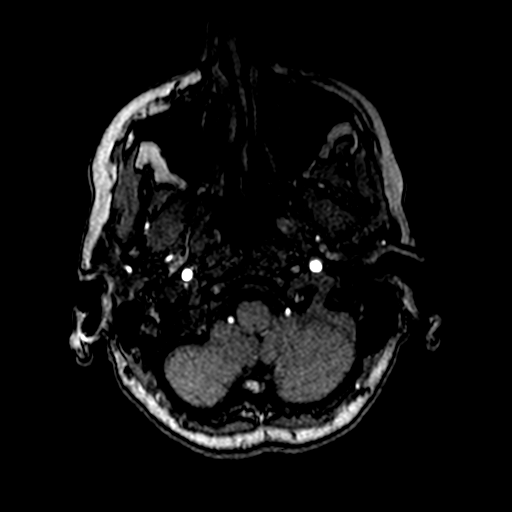
[im 42/217]
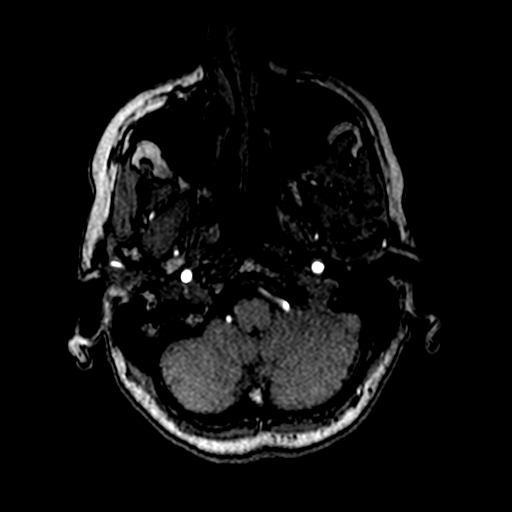
[im 46/217]
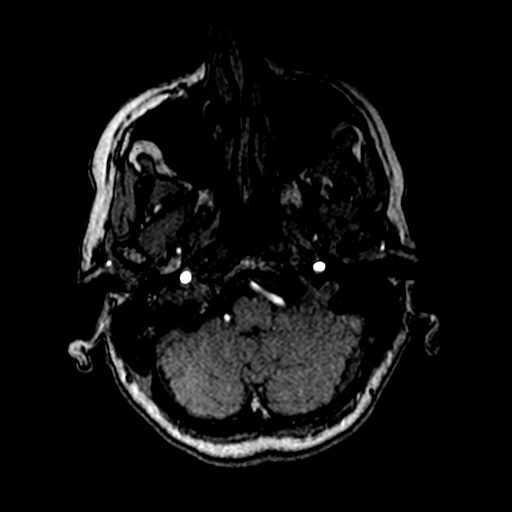
[im 69/217]
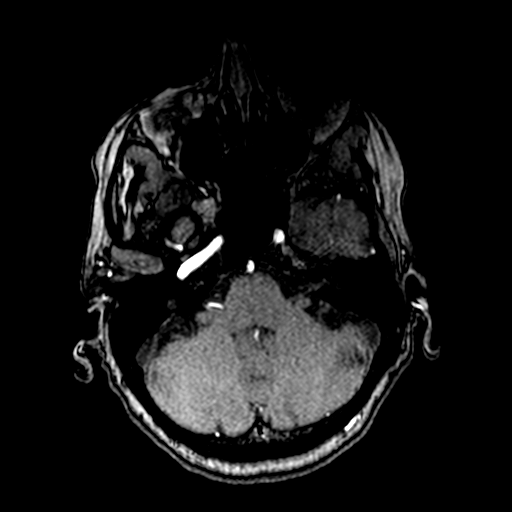
[im 97/217]
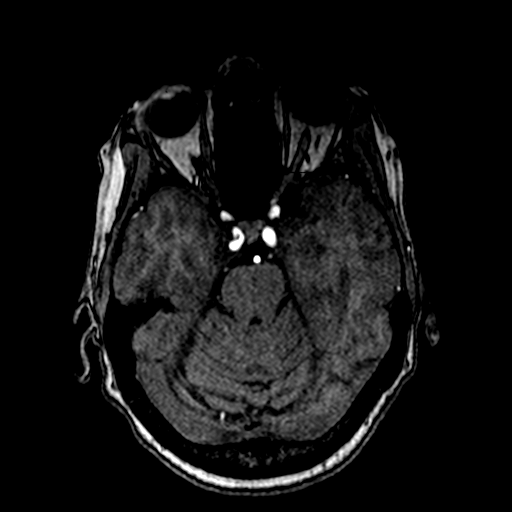
[im 111/217]
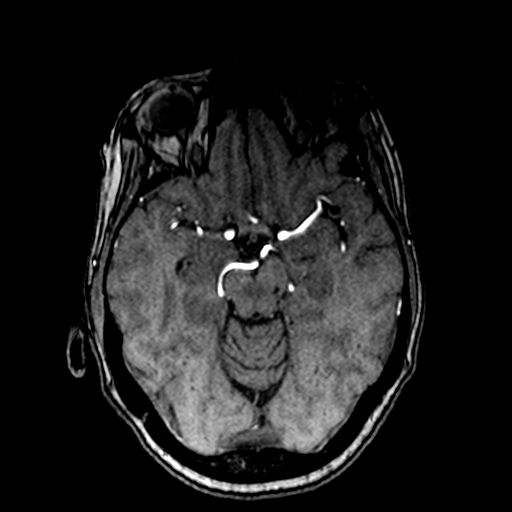
[im 125/217]
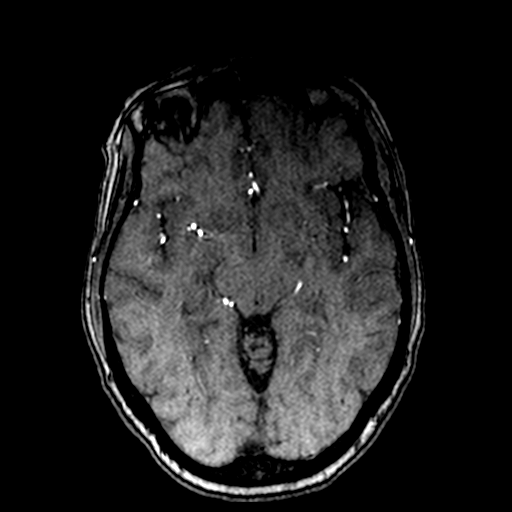
[im 152/217]
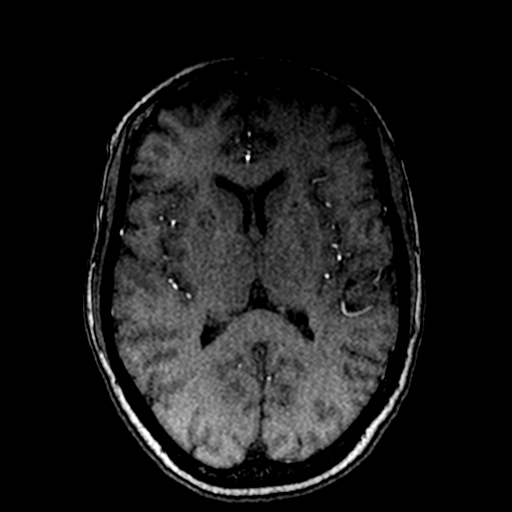
[im 180/217]
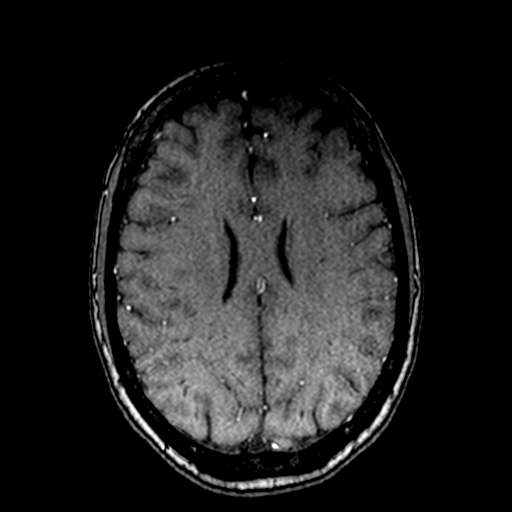
[im 184/217]
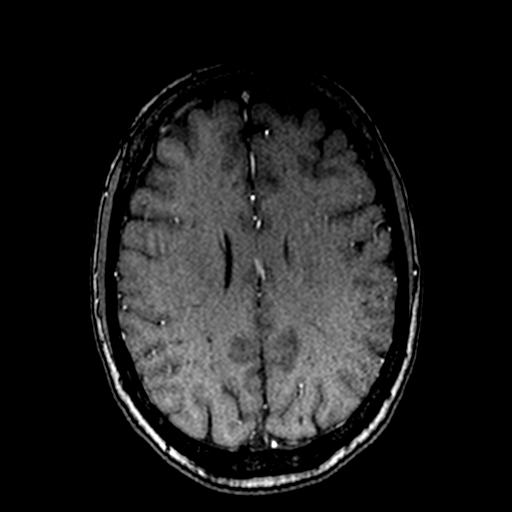
[im 207/217]
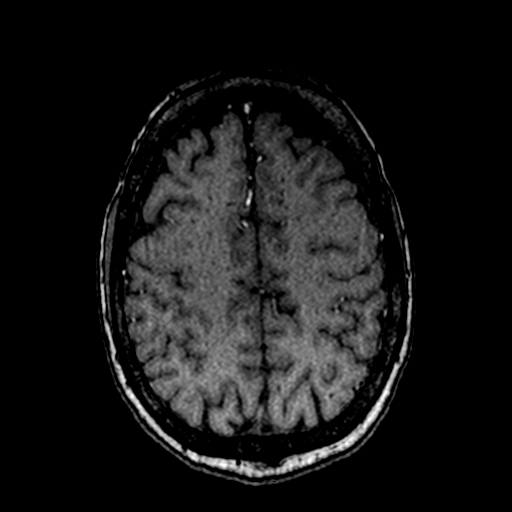

[19 of 48 positions shown; findings below may reference images not displayed]

FINDINGS: Anterior circulation: Both internal carotid arteries are patent to
the termini, without stenosis or other abnormality. A1 segments
patent. Normal anterior communicating artery. Anterior cerebral
arteries are patent to their distal aspects. No M1 stenosis or
occlusion. Normal MCA bifurcations. Distal MCA branches perfused and
symmetric.

Posterior circulation: Vertebral arteries patent to the
vertebrobasilar junction without stenosis. Posterior inferior
cerebral arteries patent bilaterally. Basilar patent to its distal
aspect. Superior cerebellar arteries patent bilaterally. PCAs
perfused to their distal aspects without stenosis. Diminutive left
posterior communicating artery. The right posterior communicating
artery is not definitively visualized.

Anatomic variants: None significant.

Other: None.
IMPRESSION: No large vessel occlusion or hemodynamically significant stenosis.
No aneurysm. No etiology is seen for the patient's headaches.

## 2023-11-17 IMAGING — US US CAROTID DUPLEX BILAT
1 series · 13 of 24 positions shown · non-contrast
Comparison: None.

CLINICAL DATA: Headaches, hyperlipidemia

EXAM:
BILATERAL CAROTID DUPLEX ULTRASOUND
TECHNIQUE: Gray scale imaging, color Doppler and duplex ultrasound were
performed of bilateral carotid and vertebral arteries in the neck.

[Series 1: us carotid duplex bilat · 13 of 104 slices shown]
[im 1/104]
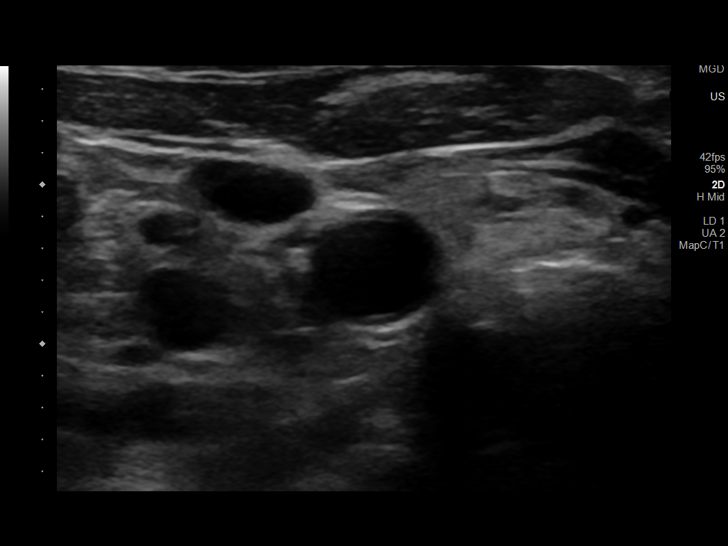
[im 9/104]
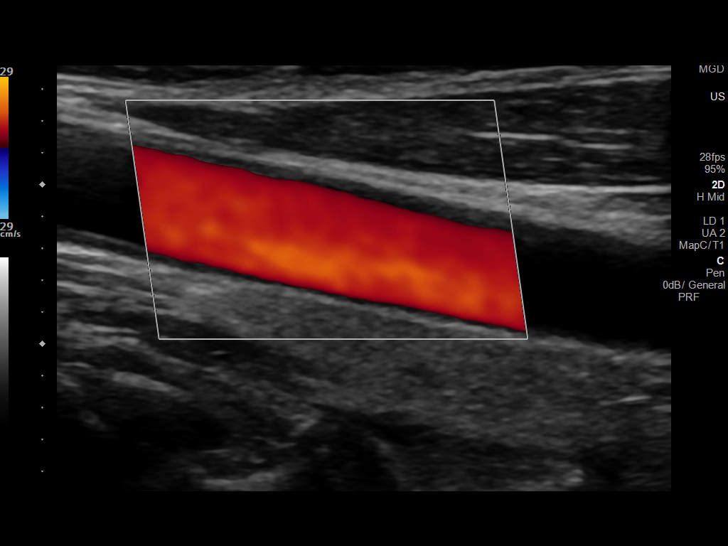
[im 18/104]
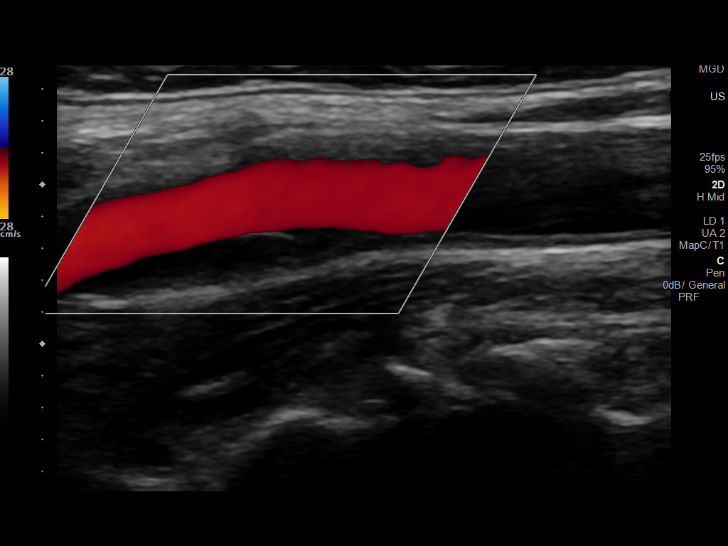
[im 27/104]
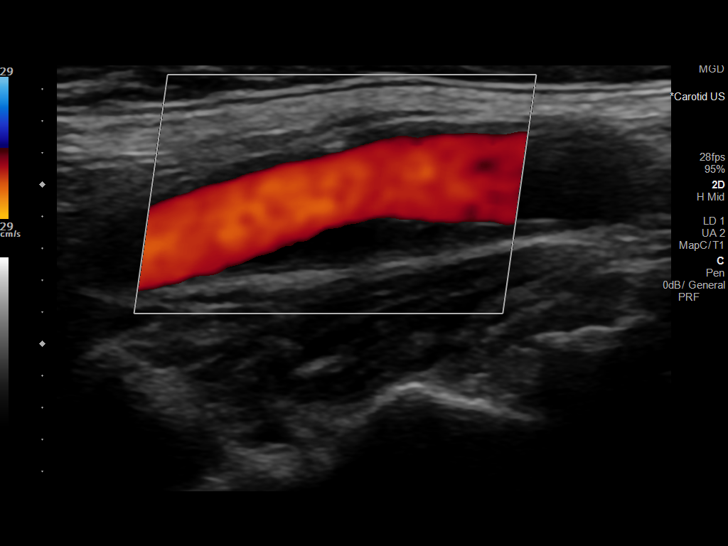
[im 36/104]
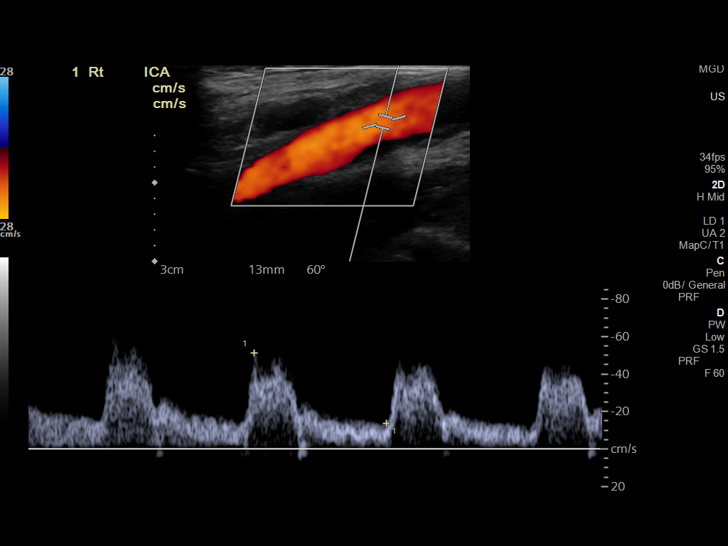
[im 45/104]
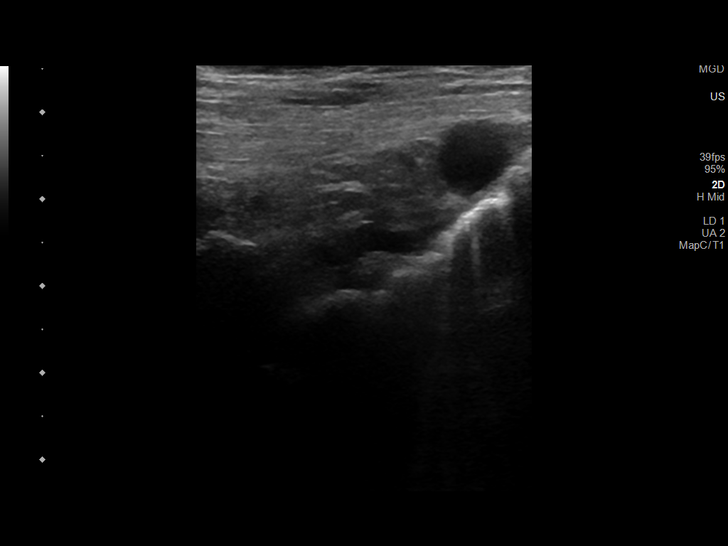
[im 54/104]
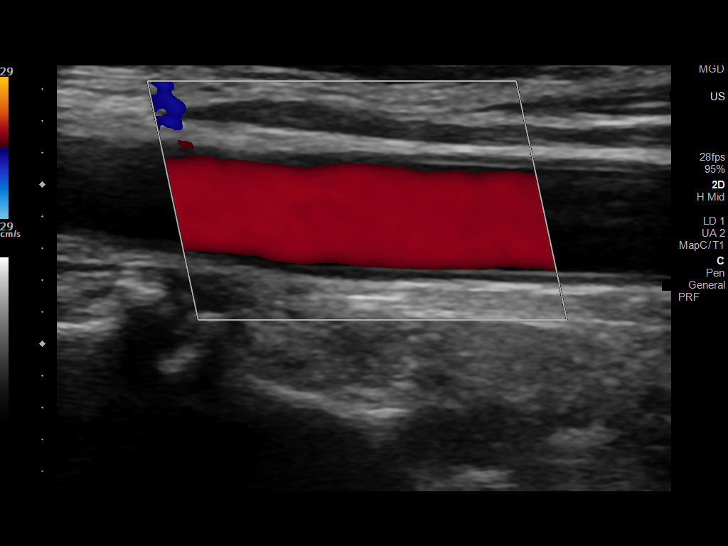
[im 59/104]
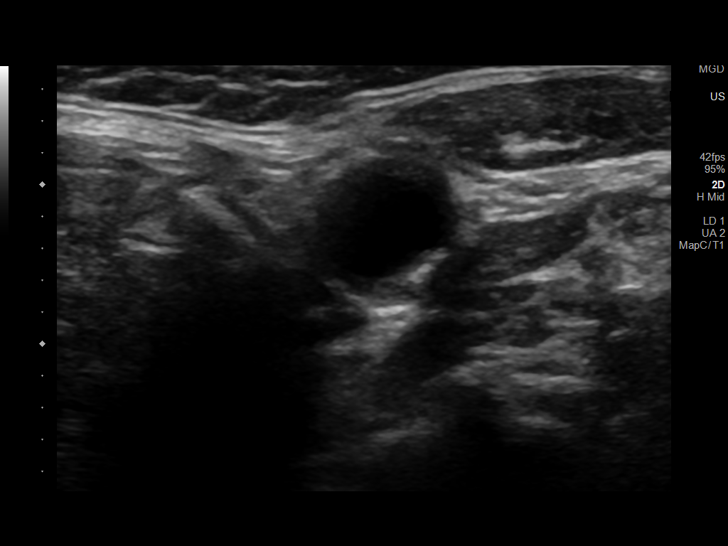
[im 68/104]
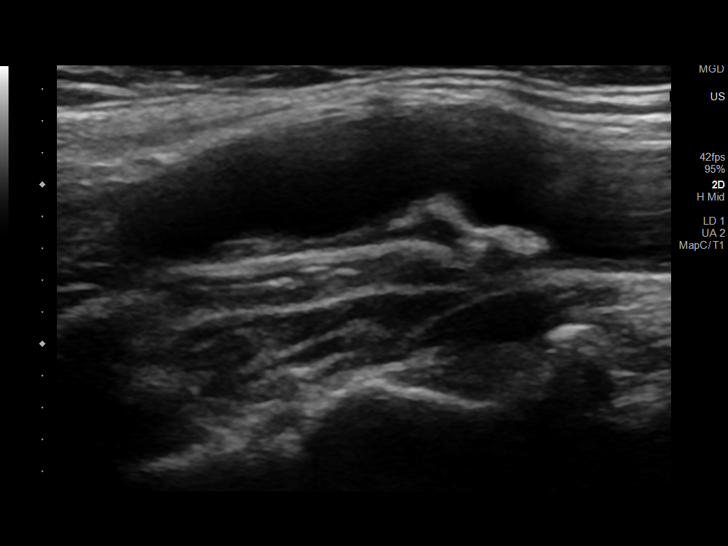
[im 77/104]
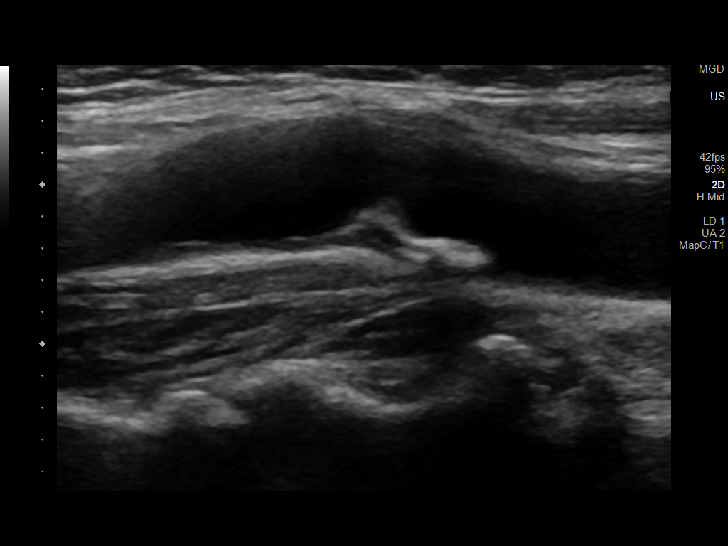
[im 86/104]
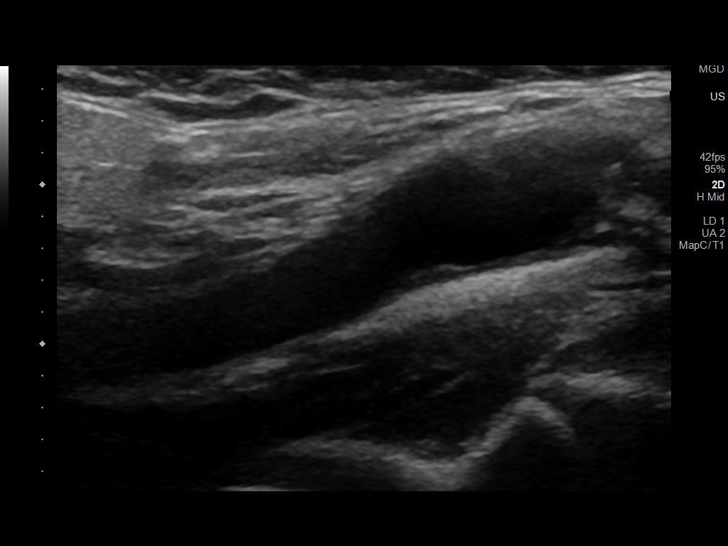
[im 95/104]
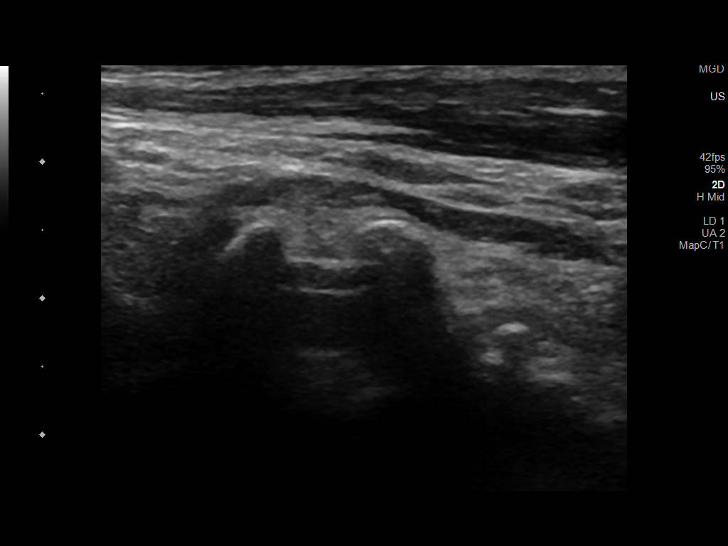
[im 104/104]
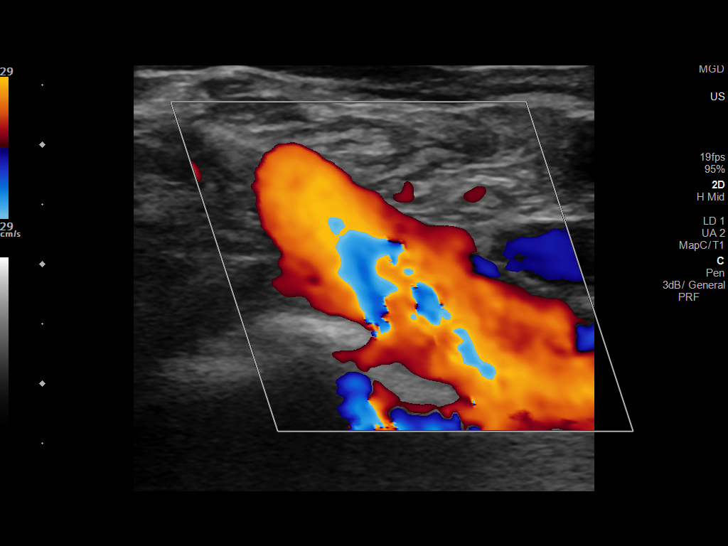

[13 of 24 positions shown; findings below may reference images not displayed]

FINDINGS: Criteria: Quantification of carotid stenosis is based on velocity
parameters that correlate the residual internal carotid diameter
with NASCET-based stenosis levels, using the diameter of the distal
internal carotid lumen as the denominator for stenosis measurement.

The following velocity measurements were obtained:

RIGHT

ICA: 85/30 cm/sec

CCA: 57/16 cm/sec

SYSTOLIC ICA/CCA RATIO:

ECA: 85 cm/sec

LEFT

ICA: 83/31 cm/sec

CCA: 63/18 cm/sec

SYSTOLIC ICA/CCA RATIO:

ECA: 85 cm/sec

RIGHT CAROTID ARTERY: Intimal thickening and minor hypoechoic plaque
formation. Negative for stenosis, velocity elevation, turbulent
flow. Degree of narrowing less than 50% by ultrasound criteria.

RIGHT VERTEBRAL ARTERY:  Normal antegrade flow

LEFT CAROTID ARTERY: Similar intimal thickening and minor
bifurcation calcific atherosclerosis. Negative for stenosis,
velocity elevation, turbulent flow. Degree of narrowing also less
than 50% by ultrasound criteria.

LEFT VERTEBRAL ARTERY:  Normal antegrade flow
IMPRESSION: Mild bilateral carotid atherosclerosis. Negative for significant
stenosis. Degree of narrowing less than 50% bilaterally by
ultrasound criteria.

Patent antegrade vertebral flow bilaterally

## 2023-11-17 MED ORDER — GABAPENTIN 300 MG PO CAPS: ORAL_CAPSULE | ORAL | 3 refills | Status: DC

## 2023-11-17 MED ORDER — CELECOXIB 200 MG PO CAPS: ORAL_CAPSULE | ORAL | 2 refills | Status: DC

## 2023-11-17 MED ORDER — HYDROCODONE-ACETAMINOPHEN 5-325 MG PO TABS: 1.0000 | ORAL_TABLET | Freq: Three times a day (TID) | ORAL | 0 refills | Status: DC | PRN

## 2023-11-17 NOTE — Progress Notes (Signed)
    Procedures performed today:    None.  Independent interpretation of notes and tests performed by another provider:   None.  Brief History, Exam, Impression, and Recommendations:    Left lumbar radiculitis Very pleasant 73 year old female, discomfort running from the left posterior buttock down the back of the leg to the bottom and side of the left foot. No red flag symptoms. I explained anatomy and pathophysiology, evolutionary anthropology of lumbar disc disease, she did get some prednisone  and Celebrex  from her PCP, minimally effective. We will bump Celebrex  up to the maximum dose, add gabapentin , short course of hydrocodone  as she does have an event coming up, formal physical therapy and baseline x-rays, return to see me in 6 weeks.    ____________________________________________ Joselyn Nicely. Sandy Crumb, M.D., ABFM., CAQSM., AME. Primary Care and Sports Medicine [facility]  Adjunct Professor of [facility] [employer] Medicine  [employer]  Restaurant manager, fast food

## 2023-11-17 NOTE — Assessment & Plan Note (Signed)
 Very pleasant 73 year old female, discomfort running from the left posterior buttock down the back of the leg to the bottom and side of the left foot. No red flag symptoms. I explained anatomy and pathophysiology, evolutionary anthropology of lumbar disc disease, she did get some prednisone  and Celebrex  from her PCP, minimally effective. We will bump Celebrex  up to the maximum dose, add gabapentin , short course of hydrocodone  as she does have an event coming up, formal physical therapy and baseline x-rays, return to see me in 6 weeks.

## 2023-11-22 ENCOUNTER — Ambulatory Visit: Attending: Sports Medicine | Admitting: Physical Therapy

## 2023-11-22 ENCOUNTER — Other Ambulatory Visit: Payer: Self-pay

## 2023-11-22 ENCOUNTER — Encounter: Payer: Self-pay | Admitting: Physical Therapy

## 2023-11-22 DIAGNOSIS — M5416 Radiculopathy, lumbar region: Secondary | ICD-10-CM | POA: Insufficient documentation

## 2023-11-22 NOTE — Therapy (Addendum)
 OUTPATIENT PHYSICAL THERAPY THORACOLUMBAR EVALUATION   Patient Name: Stephanie Stone MRN: [id number] DOB:Jan 03, 1951, 73 y.o., female Today's Date: 11/22/2023  END OF SESSION:  PT End of Session - 11/22/23 1543     Visit Number 1    Number of Visits 16    Date for PT Re-Evaluation 01/17/24    Authorization Type Medicare    Authorization - Visit Number 1    Progress Note Due on Visit 10    PT Start Time 1100    PT Stop Time 1138    PT Time Calculation (min) 38 min    Activity Tolerance Patient tolerated treatment well    Behavior During Therapy [facility] for tasks assessed/performed             Past Medical History:  Diagnosis Date   Allergy    Alopecia    areata   Anxiety    situational   Depression    Encounter for Medicare annual wellness exam 06/20/2023   Hyperlipidemia    Hypertension    Lung infiltrate 07/12/2008   Resolved on its own without biopsy   Menopause, premature    Migraines    MVP (mitral valve prolapse)    Past Surgical History:  Procedure Laterality Date   ABDOMINAL HYSTERECTOMY  07/12/1977   endometriosis   APPENDECTOMY     Patient Active Problem List   Diagnosis Date Noted   Left lumbar radiculitis 11/17/2023   Vaginal vault prolapse after hysterectomy 09/22/2023   SUI (stress urinary incontinence, female) 09/22/2023   Overactive bladder 09/22/2023   Encounter for screening mammogram for malignant neoplasm of breast 06/20/2023   Osteopenia 06/20/2023   Intertrigo 12/23/2022   Bronchiectasis (HCC) 12/30/2021   Sleep disturbance 05/19/2021   Degenerative disc disease, cervical 08/07/2019   Occipital neuralgia of right side 08/07/2019   Slow transit constipation 10/01/2014   Hyperlipidemia 08/11/2010   DEPRESSION, SITUATIONAL 08/11/2010   Essential hypertension 07/08/2010   ALOPECIA AREATA 05/29/2010   MENOPAUSE, PREMATURE 02/17/2007   Migraine headache 02/17/2007   MVP (mitral valve prolapse) 02/17/2007    PCP: Greer Leak  REFERRING  PROVIDER: Sandy Crumb  REFERRING DIAG: Lt lumbar radiculitis  Rationale for Evaluation and Treatment: Rehabilitation  THERAPY DIAG:  Left lumbar radiculitis  ONSET DATE: 10/31/23 (MD appt)  SUBJECTIVE:                                                                                                                                                                                           SUBJECTIVE STATEMENT: Last month the patient had a pedicure and the massage chair was on the highest setting. She felt uncomfortable and  eventually turned the massage off. Since that time she has had pain in her Lt hip that goes down her Lt LE. She states that she initially had some weakness due to the pain mostly from her hip down. She states that standing and walking cause the most pain, pain decreases with sitting and lying. She has seen MD who prescribed medications which have helped decrease symptoms some but she continues with difficulty standing > 10-15 minutes.  PERTINENT HISTORY:  Cervical DDD  PAIN:  Are you having pain? Yes: NPRS scale: 0/10 currently, 8-9/10 at worst Pain location: Lt hip and LE Pain description: pain Aggravating factors: standing Relieving factors: meds, sitting  PRECAUTIONS: None  RED FLAGS: None   WEIGHT BEARING RESTRICTIONS: No  FALLS:  Has patient fallen in last 6 months? 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  DIAGNOSTIC FINDINGS:  Lumbar x ray: Mild to moderate degenerative joint changes of lower lumbar spine.   PATIENT SURVEYS:  Modified Oswestry 14/50   COGNITION: Overall cognitive status: Within functional limits for tasks assessed     SENSATION: WFL  MUSCLE LENGTH: Hamstrings: WFL Thomas test: negative bilat  POSTURE:  anterior pelvic tilt  PALPATION: Increased mm spasticity Lt piriformis and glutes  LUMBAR ROM:   AROM eval  Flexion WFL  Extension WFL  Right lateral flexion WFL  Left lateral flexion WFL  Right rotation WFL  Left rotation WFL   (Blank rows = not tested)  LOWER EXTREMITY ROM:  grossly WFL    LOWER EXTREMITY MMT:    MMT Right eval Left eval  Hip flexion 4 4  Hip extension 4 4  Hip abduction 4+ 4-  Hip adduction    Hip internal rotation    Hip external rotation    Knee flexion 4 4  Knee extension    Ankle dorsiflexion    Ankle plantarflexion    Ankle inversion    Ankle eversion     (Blank rows = not tested)  LUMBAR SPECIAL TESTS:  Straight leg raise test: Negative and FABER test: Negative    TREATMENT DATE: 11/22/23 See HEP Pt educated on PT POC and goals, HEP, rationale for treatment, relevant anatomy                                                                                                                                 PATIENT EDUCATION:  Education details: PT POC and goals, HEP Person educated: Patient Education method: Explanation, Demonstration, and Handouts Education comprehension: verbalized understanding and returned demonstration  HOME EXERCISE PROGRAM: Access Code: [email or web address] URL: [email or web address] Date: 11/22/2023 Prepared by: Lowery Rue  Exercises - Resistance Pulldown with March  - 1 x daily - 7 x weekly - 3 sets -  10 reps - Standing Anti-Rotation Press with Anchored Resistance  - 1 x daily - 7 x weekly - 2 sets - 10 reps - Plank with Hands on Table  - 1 x daily - 7 x weekly - 1 sets - 5 reps - 10-15 seconds hold - Standing Piriformis Release with Ball at Wall  - 1 x daily - 7 x weekly - 3 sets - 10 reps  ASSESSMENT:  CLINICAL IMPRESSION: Patient is a 73 y.o. female who was seen today for physical therapy evaluation and treatment for Lt lumbar radiculitis. She presents with increased mm spasticity in glutes  and piriformis, decreased core and hip strength and increased pain and decreased activity tolerance. Pt will benefit from skilled PT to address deficits and improve functional activity tolerance.   OBJECTIVE IMPAIRMENTS: decreased activity tolerance, difficulty walking, decreased strength, increased muscle spasms, and pain.   ACTIVITY LIMITATIONS: lifting, standing, stairs, and locomotion level  PARTICIPATION LIMITATIONS: meal prep, cleaning, laundry, shopping, and community activity  PERSONAL FACTORS: Age are also affecting patient's functional outcome.   REHAB POTENTIAL: Good  CLINICAL DECISION MAKING: Evolving/moderate complexity  EVALUATION COMPLEXITY: Moderate   GOALS: Goals reviewed with patient? Yes  SHORT TERM GOALS: Target date: 12/20/2023    Pt will be independent with initial HEP Baseline: Goal status: INITIAL  2.  Pt will tolerate standing x 15 minutes with pain <= 2/10 Baseline:  Goal status: INITIAL    LONG TERM GOALS: Target date: 01/17/2024    Pt will be independent with advanced HEP Baseline:  Goal status: INITIAL  2.  Pt will improve modified Oswestry to <= 4/50 to demo improved functional mobility Baseline:  Goal status: INITIAL  3.  Pt will improve bilat LE strength to 4+/5 to improve standing and walking tolerance Baseline:  Goal status: INITIAL  4.  Pt will tolerate standing x 30 minutes with pain <= 2/10 Baseline:  Goal status: INITIAL    PLAN:  PT FREQUENCY: 2x/week  PT DURATION: 8 weeks  PLANNED INTERVENTIONS: 97164- PT Re-evaluation, 97110-Therapeutic exercises, 97530- Therapeutic activity, 97112- Neuromuscular re-education, 97535- Self Care, 13086- Manual therapy, [id number]- Aquatic Therapy, V7846- Electrical stimulation (unattended), [id number]- Traction (mechanical), [id number]- Ionotophoresis 4mg /ml Dexamethasone, Patient/Family education, Taping, Dry Needling, Cryotherapy, and Moist heat.  PLAN FOR NEXT SESSION: assess response to HEP,  core and glute stength, manual as indicated   Birgitta Uhlir, PT 11/22/2023, 3:44 PM

## 2023-11-22 NOTE — Addendum Note (Signed)
 Addended by: Bradly Cage on: 11/22/2023 03:46 PM   Modules accepted: Orders

## 2023-11-28 ENCOUNTER — Encounter: Payer: Self-pay | Admitting: Physical Therapy

## 2023-11-28 ENCOUNTER — Ambulatory Visit: Admitting: Physical Therapy

## 2023-11-28 DIAGNOSIS — M5416 Radiculopathy, lumbar region: Secondary | ICD-10-CM | POA: Diagnosis not present

## 2023-11-28 NOTE — Therapy (Signed)
 OUTPATIENT PHYSICAL THERAPY THORACOLUMBAR TREATMENT   Patient Name: Stephanie Stone MRN: [id number] DOB:1951-05-21, 73 y.o., female Today's Date: 11/28/2023  END OF SESSION:  PT End of Session - 11/28/23 1140     Visit Number 2    Number of Visits 16    Date for PT Re-Evaluation 01/17/24    Authorization - Visit Number 2    Progress Note Due on Visit 10    PT Start Time 1100    PT Stop Time 1139    PT Time Calculation (min) 39 min    Activity Tolerance Patient tolerated treatment well    Behavior During Therapy [facility] for tasks assessed/performed              Past Medical History:  Diagnosis Date   Allergy    Alopecia    areata   Anxiety    situational   Depression    Encounter for Medicare annual wellness exam 06/20/2023   Hyperlipidemia    Hypertension    Lung infiltrate 07/12/2008   Resolved on its own without biopsy   Menopause, premature    Migraines    MVP (mitral valve prolapse)    Past Surgical History:  Procedure Laterality Date   ABDOMINAL HYSTERECTOMY  07/12/1977   endometriosis   APPENDECTOMY     Patient Active Problem List   Diagnosis Date Noted   Left lumbar radiculitis 11/17/2023   Vaginal vault prolapse after hysterectomy 09/22/2023   SUI (stress urinary incontinence, female) 09/22/2023   Overactive bladder 09/22/2023   Encounter for screening mammogram for malignant neoplasm of breast 06/20/2023   Osteopenia 06/20/2023   Intertrigo 12/23/2022   Bronchiectasis (HCC) 12/30/2021   Sleep disturbance 05/19/2021   Degenerative disc disease, cervical 08/07/2019   Occipital neuralgia of right side 08/07/2019   Slow transit constipation 10/01/2014   Hyperlipidemia 08/11/2010   DEPRESSION, SITUATIONAL 08/11/2010   Essential hypertension 07/08/2010   ALOPECIA AREATA 05/29/2010   MENOPAUSE, PREMATURE 02/17/2007   Migraine headache 02/17/2007   MVP (mitral valve prolapse) 02/17/2007    PCP: Greer Leak  REFERRING PROVIDER: Sandy Crumb  REFERRING  DIAG: Lt lumbar radiculitis  Rationale for Evaluation and Treatment: Rehabilitation  THERAPY DIAG:  Left lumbar radiculitis  ONSET DATE: 10/31/23 (MD appt)  SUBJECTIVE:                                                                                                                                                                                           SUBJECTIVE STATEMENT: Pt states she tried HEP and did have some discomfort with the planks. She went out to dinner and states that when she was leaving  the restaurant her pain flared up but it did resolve with seated rest  PERTINENT HISTORY:  Cervical DDD Last month the patient had a pedicure and the massage chair was on the highest setting. She felt uncomfortable and eventually turned the massage off. Since that time she has had pain in her Lt hip that goes down her Lt LE. She states that she initially had some weakness due to the pain mostly from her hip down. She states that standing and walking cause the most pain, pain decreases with sitting and lying. She has seen MD who prescribed medications which have helped decrease symptoms some but she continues with difficulty standing > 10-15 minutes.  PAIN:  Are you having pain? Yes: NPRS scale: 0/10 currently, 8-9/10 at worst Pain location: Lt hip and LE Pain description: pain Aggravating factors: standing Relieving factors: meds, sitting  PRECAUTIONS: None  RED FLAGS: None   WEIGHT BEARING RESTRICTIONS: No  FALLS:  Has patient fallen in last 6 months? 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  DIAGNOSTIC FINDINGS:  Lumbar x ray: Mild to moderate degenerative joint changes of lower lumbar spine.   PATIENT SURVEYS:  Modified Oswestry 14/50    COGNITION: Overall cognitive status: Within functional limits for tasks assessed     SENSATION: WFL  MUSCLE LENGTH: Hamstrings: WFL Thomas test: negative bilat  POSTURE: anterior pelvic tilt  PALPATION: Increased mm spasticity Lt piriformis and glutes  LUMBAR ROM:   AROM eval  Flexion WFL  Extension WFL  Right lateral flexion WFL  Left lateral flexion WFL  Right rotation WFL  Left rotation WFL   (Blank rows = not tested)  LOWER EXTREMITY ROM:  grossly WFL    LOWER EXTREMITY MMT:    MMT Right eval Left eval  Hip flexion 4 4  Hip extension 4 4  Hip abduction 4+ 4-  Hip adduction    Hip internal rotation    Hip external rotation    Knee flexion 4 4  Knee extension    Ankle dorsiflexion    Ankle plantarflexion    Ankle inversion    Ankle eversion     (Blank rows = not tested)  LUMBAR SPECIAL TESTS:  Straight leg raise test: Negative and FABER test: Negative    [facility] Adult PT Treatment:                                                DATE: 11/28/23 Therapeutic Exercise/Activity: Pelvic tilts seated Supine piriformis stretch with strap 2 x 20 sec Pigeon stretch on mat table 2 x 20 sec Green TB pull down with march Pallof press red TB x 10 bilat Suitcase carry 10# KB Sit <> stand 10# KB x 10 Sidelying LT hip abd 2 x 10 Bridge 2 x 10 Manual Therapy: STM Lt glutes/piriformis CPAs UPAs grade 2-3 SIJ   TREATMENT DATE: 11/22/23 See HEP Pt educated on PT POC and goals, HEP, rationale for treatment, relevant anatomy  PATIENT EDUCATION:  Education details: PT POC and goals, HEP Person educated: Patient Education method: Explanation, Demonstration, and Handouts Education comprehension: verbalized understanding and returned demonstration  HOME EXERCISE PROGRAM: Access Code: [email or web address] URL:  [email or web address] Date: 11/28/2023 Prepared by: Lowery Rue  Exercises - Resistance Pulldown with March  - 1 x daily - 7 x weekly - 3 sets - 10 reps - Standing Anti-Rotation Press with Anchored Resistance  - 1 x daily - 7 x weekly - 2 sets - 10 reps - Standing Piriformis Release with Ball at Wall  - 1 x daily - 7 x weekly - 3 sets - 10 reps - Seated Table Piriformis Stretch  - 1 x daily - 7 x weekly - 1 sets - 3 reps - 20-30 seconds hold - Sidelying Hip Abduction  - 1 x daily - 7 x weekly - 3 sets - 10 reps - Supine Bridge  - 1 x daily - 7 x weekly - 3 sets - 10 reps  ASSESSMENT:  CLINICAL IMPRESSION: Pt continues with muscle spasticity in Lt piriformis. Added stretching to address this at home. Continued focus on core and hip strength and updated HEP. Pt with good response to all activities throughout session    GOALS: Goals reviewed with patient? Yes  SHORT TERM GOALS: Target date: 12/20/2023    Pt will be independent with initial HEP Baseline: Goal status: INITIAL  2.  Pt will tolerate standing x 15 minutes with pain <= 2/10 Baseline:  Goal status: INITIAL    LONG TERM GOALS: Target date: 01/17/2024    Pt will be independent with advanced HEP Baseline:  Goal status: INITIAL  2.  Pt will improve modified Oswestry to <= 4/50 to demo improved functional mobility Baseline:  Goal status: INITIAL  3.  Pt will improve bilat LE strength to 4+/5 to improve standing and walking tolerance Baseline:  Goal status: INITIAL  4.  Pt will tolerate standing x 30 minutes with pain <= 2/10 Baseline:  Goal status: INITIAL    PLAN:  PT FREQUENCY: 2x/week  PT DURATION: 8 weeks  PLANNED INTERVENTIONS: 97164- PT Re-evaluation, 97110-Therapeutic exercises, 97530- Therapeutic activity, 97112- Neuromuscular re-education, 97535- Self Care, 54098- Manual therapy, [id number]- Aquatic Therapy, J1914- Electrical stimulation (unattended), [id number]- Traction (mechanical),  [id number]- Ionotophoresis 4mg /ml Dexamethasone, Patient/Family education, Taping, Dry Needling, Cryotherapy, and Moist heat.  PLAN FOR NEXT SESSION: piriformis stretch, core and glute stength, manual as indicated   Baltazar Pekala, PT 11/28/2023, 11:41 AM

## 2023-11-30 ENCOUNTER — Encounter: Payer: Self-pay | Admitting: Physical Therapy

## 2023-11-30 ENCOUNTER — Ambulatory Visit: Admitting: Physical Therapy

## 2023-11-30 DIAGNOSIS — M5416 Radiculopathy, lumbar region: Secondary | ICD-10-CM | POA: Diagnosis not present

## 2023-11-30 NOTE — Therapy (Signed)
 OUTPATIENT PHYSICAL THERAPY THORACOLUMBAR TREATMENT   Patient Name: Stephanie Stone MRN: [id number] DOB:March 17, 1951, 73 y.o., female Today's Date: 11/30/2023  END OF SESSION:  PT End of Session - 11/30/23 1108     Visit Number 3    Number of Visits 16    Date for PT Re-Evaluation 01/17/24    Authorization Type Medicare    Authorization - Visit Number 3    Progress Note Due on Visit 10    PT Start Time 1017    PT Stop Time 1058    PT Time Calculation (min) 41 min    Activity Tolerance Patient tolerated treatment well    Behavior During Therapy [facility] for tasks assessed/performed               Past Medical History:  Diagnosis Date   Allergy    Alopecia    areata   Anxiety    situational   Depression    Encounter for Medicare annual wellness exam 06/20/2023   Hyperlipidemia    Hypertension    Lung infiltrate 07/12/2008   Resolved on its own without biopsy   Menopause, premature    Migraines    MVP (mitral valve prolapse)    Past Surgical History:  Procedure Laterality Date   ABDOMINAL HYSTERECTOMY  07/12/1977   endometriosis   APPENDECTOMY     Patient Active Problem List   Diagnosis Date Noted   Left lumbar radiculitis 11/17/2023   Vaginal vault prolapse after hysterectomy 09/22/2023   SUI (stress urinary incontinence, female) 09/22/2023   Overactive bladder 09/22/2023   Encounter for screening mammogram for malignant neoplasm of breast 06/20/2023   Osteopenia 06/20/2023   Intertrigo 12/23/2022   Bronchiectasis (HCC) 12/30/2021   Sleep disturbance 05/19/2021   Degenerative disc disease, cervical 08/07/2019   Occipital neuralgia of right side 08/07/2019   Slow transit constipation 10/01/2014   Hyperlipidemia 08/11/2010   DEPRESSION, SITUATIONAL 08/11/2010   Essential hypertension 07/08/2010   ALOPECIA AREATA 05/29/2010   MENOPAUSE, PREMATURE 02/17/2007   Migraine headache 02/17/2007   MVP (mitral valve prolapse) 02/17/2007    PCP: Greer Leak  REFERRING  PROVIDER: Sandy Crumb  REFERRING DIAG: Lt lumbar radiculitis  Rationale for Evaluation and Treatment: Rehabilitation  THERAPY DIAG:  Left lumbar radiculitis  ONSET DATE: 10/31/23 (MD appt)  SUBJECTIVE:                                                                                                                                                                                           SUBJECTIVE STATEMENT: Pt states that she stood while waiting for her appointment so she could flare up her symptoms today  PERTINENT HISTORY:  Cervical DDD Last month the patient had a pedicure and the massage chair was on the highest setting. She felt uncomfortable and eventually turned the massage off. Since that time she has had pain in her Lt hip that goes down her Lt LE. She states that she initially had some weakness due to the pain mostly from her hip down. She states that standing and walking cause the most pain, pain decreases with sitting and lying. She has seen MD who prescribed medications which have helped decrease symptoms some but she continues with difficulty standing > 10-15 minutes.  PAIN:  Are you having pain? Yes: NPRS scale: 5/10 currently, 8-9/10 at worst Pain location: Lt hip and LE Pain description: pain Aggravating factors: standing Relieving factors: meds, sitting  PRECAUTIONS: None  RED FLAGS: None   WEIGHT BEARING RESTRICTIONS: No  FALLS:  Has patient fallen in last 6 months? 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  DIAGNOSTIC FINDINGS:  Lumbar x ray: Mild to moderate degenerative joint changes of lower lumbar spine.   PATIENT SURVEYS:  Modified Oswestry 14/50   COGNITION: Overall cognitive status: Within functional limits for tasks  assessed     SENSATION: WFL  MUSCLE LENGTH: Hamstrings: WFL Thomas test: negative bilat  POSTURE: anterior pelvic tilt  PALPATION: Increased mm spasticity Lt piriformis and glutes  LUMBAR ROM:   AROM eval  Flexion WFL  Extension WFL  Right lateral flexion WFL  Left lateral flexion WFL  Right rotation WFL  Left rotation WFL   (Blank rows = not tested)  LOWER EXTREMITY ROM:  grossly WFL    LOWER EXTREMITY MMT:    MMT Right eval Left eval  Hip flexion 4 4  Hip extension 4 4  Hip abduction 4+ 4-  Hip adduction    Hip internal rotation    Hip external rotation    Knee flexion 4 4  Knee extension    Ankle dorsiflexion    Ankle plantarflexion    Ankle inversion    Ankle eversion     (Blank rows = not tested)  LUMBAR SPECIAL TESTS:  Straight leg raise test: Negative and FABER test: Negative    [facility] Adult PT Treatment:                                                DATE: 11/30/23 Therapeutic Exercise/Activity: Sidelying clam green TB 2 x 10 Bent over hip ext with knee bent x 10 bilat Sit <> stand 10# KB x 10 Resisted walking fwd/bkwd 15# Resisted walking laterally 10# --> increased symptoms with Rt lateral walking Pigeon stretch on mat table 2 x 20 sec Manual Therapy: STM and TPR Lt piriformis/glutes   [facility] Adult PT Treatment:                                                DATE: 11/28/23 Therapeutic Exercise/Activity: Pelvic tilts seated Supine piriformis stretch with strap 2 x 20 sec Pigeon stretch on mat table 2 x 20 sec  Green TB pull down with march Pallof press red TB x 10 bilat Suitcase carry 10# KB Sit <> stand 10# KB x 10 Sidelying LT hip abd 2 x 10 Bridge 2 x 10 Manual Therapy: STM Lt glutes/piriformis CPAs UPAs grade 2-3 SIJ    PATIENT EDUCATION:  Education details: PT POC and goals, HEP Person educated: Patient Education method: Programmer, multimedia, Demonstration, and Handouts Education comprehension: verbalized understanding and returned  demonstration  HOME EXERCISE PROGRAM: Access Code: [email or web address] URL: [email or web address] Date: 11/30/2023 Prepared by: Lowery Rue  Exercises - Resistance Pulldown with March  - 1 x daily - 7 x weekly - 3 sets - 10 reps - Standing Anti-Rotation Press with Anchored Resistance  - 1 x daily - 7 x weekly - 2 sets - 10 reps - Standing Piriformis Release with Ball at Wall  - 1 x daily - 7 x weekly - 3 sets - 10 reps - Seated Table Piriformis Stretch  - 1 x daily - 7 x weekly - 1 sets - 3 reps - 20-30 seconds hold - Sidelying Hip Abduction  - 1 x daily - 7 x weekly - 3 sets - 10 reps - Supine Bridge  - 1 x daily - 7 x weekly - 3 sets - 10 reps - Clamshell with Resistance  - 1 x daily - 7 x weekly - 3 sets - 10 reps  ASSESSMENT:  CLINICAL IMPRESSION: Pt with point tenderness in piriformis today to increased trigger point release and  manual work performed. Continued to progress hip and core strengthening and educated pt on rationale for treatment. Pt with good tolerance to activities during session today -- some increased symptoms with Rt lateral walking but these resolved with seated rest    GOALS: Goals reviewed with patient? Yes  SHORT TERM GOALS: Target date: 12/20/2023    Pt will be independent with initial HEP Baseline: Goal status: INITIAL  2.  Pt will tolerate standing x 15 minutes with pain <= 2/10 Baseline:  Goal status: INITIAL    LONG TERM GOALS: Target date: 01/17/2024    Pt will be independent with advanced HEP Baseline:  Goal status: INITIAL  2.  Pt will improve modified Oswestry to <= 4/50 to demo improved functional mobility Baseline:  Goal status: INITIAL  3.  Pt will improve bilat LE strength to 4+/5 to improve standing and walking tolerance Baseline:  Goal status: INITIAL  4.  Pt will tolerate standing x 30 minutes with pain <= 2/10 Baseline:  Goal status: INITIAL    PLAN:  PT FREQUENCY: 2x/week  PT DURATION: 8  weeks  PLANNED INTERVENTIONS: 97164- PT Re-evaluation, 97110-Therapeutic exercises, 97530- Therapeutic activity, 97112- Neuromuscular re-education, 97535- Self Care, 62952- Manual therapy, [id number]- Aquatic Therapy, W4132- Electrical stimulation (unattended), [id number]- Traction (mechanical), [id number]- Ionotophoresis 4mg /ml Dexamethasone, Patient/Family education, Taping, Dry Needling, Cryotherapy, and Moist heat.  PLAN FOR NEXT SESSION: piriformis stretch, core and glute stength, manual as indicated   Mayo Faulk, PT 11/30/2023, 11:09 AM

## 2023-12-05 DIAGNOSIS — R300 Dysuria: Secondary | ICD-10-CM | POA: Diagnosis not present

## 2023-12-05 DIAGNOSIS — N3001 Acute cystitis with hematuria: Secondary | ICD-10-CM | POA: Diagnosis not present

## 2023-12-13 ENCOUNTER — Ambulatory Visit: Attending: Sports Medicine | Admitting: Physical Therapy

## 2023-12-13 ENCOUNTER — Encounter: Payer: Self-pay | Admitting: Physical Therapy

## 2023-12-13 DIAGNOSIS — M5416 Radiculopathy, lumbar region: Secondary | ICD-10-CM | POA: Diagnosis not present

## 2023-12-13 NOTE — Therapy (Signed)
 OUTPATIENT PHYSICAL THERAPY THORACOLUMBAR TREATMENT   Patient Name: Stephanie Stone MRN: [id number] DOB:April 02, 1951, 73 y.o., female Today's Date: 12/13/2023  END OF SESSION:  PT End of Session - 12/13/23 1054     Visit Number 4    Number of Visits 16    Date for PT Re-Evaluation 01/17/24    Authorization Type Medicare    Authorization - Visit Number 4    Progress Note Due on Visit 10    PT Start Time 1015    PT Stop Time 1055    PT Time Calculation (min) 40 min    Activity Tolerance Patient tolerated treatment well    Behavior During Therapy [facility] for tasks assessed/performed                Past Medical History:  Diagnosis Date   Allergy    Alopecia    areata   Anxiety    situational   Depression    Encounter for Medicare annual wellness exam 06/20/2023   Hyperlipidemia    Hypertension    Lung infiltrate 07/12/2008   Resolved on its own without biopsy   Menopause, premature    Migraines    MVP (mitral valve prolapse)    Past Surgical History:  Procedure Laterality Date   ABDOMINAL HYSTERECTOMY  07/12/1977   endometriosis   APPENDECTOMY     Patient Active Problem List   Diagnosis Date Noted   Left lumbar radiculitis 11/17/2023   Vaginal vault prolapse after hysterectomy 09/22/2023   SUI (stress urinary incontinence, female) 09/22/2023   Overactive bladder 09/22/2023   Encounter for screening mammogram for malignant neoplasm of breast 06/20/2023   Osteopenia 06/20/2023   Intertrigo 12/23/2022   Bronchiectasis (HCC) 12/30/2021   Sleep disturbance 05/19/2021   Degenerative disc disease, cervical 08/07/2019   Occipital neuralgia of right side 08/07/2019   Slow transit constipation 10/01/2014   Hyperlipidemia 08/11/2010   DEPRESSION, SITUATIONAL 08/11/2010   Essential hypertension 07/08/2010   ALOPECIA AREATA 05/29/2010   MENOPAUSE, PREMATURE 02/17/2007   Migraine headache 02/17/2007   MVP (mitral valve prolapse) 02/17/2007    PCP: Greer Leak  REFERRING  PROVIDER: Sandy Crumb  REFERRING DIAG: Lt lumbar radiculitis  Rationale for Evaluation and Treatment: Rehabilitation  THERAPY DIAG:  Left lumbar radiculitis  ONSET DATE: 10/31/23 (MD appt)  SUBJECTIVE:                                                                                                                                                                                           SUBJECTIVE STATEMENT: Pt states she still has some symptoms going down her leg. She was better able to walk  up and down ramps without pain  PERTINENT HISTORY:  Cervical DDD Last month the patient had a pedicure and the massage chair was on the highest setting. She felt uncomfortable and eventually turned the massage off. Since that time she has had pain in her Lt hip that goes down her Lt LE. She states that she initially had some weakness due to the pain mostly from her hip down. She states that standing and walking cause the most pain, pain decreases with sitting and lying. She has seen MD who prescribed medications which have helped decrease symptoms some but she continues with difficulty standing > 10-15 minutes.  PAIN:  Are you having pain? Yes: NPRS scale: 5/10 currently, 8-9/10 at worst Pain location: Lt hip and LE Pain description: pain Aggravating factors: standing Relieving factors: meds, sitting  PRECAUTIONS: None  RED FLAGS: None   WEIGHT BEARING RESTRICTIONS: No  FALLS:  Has patient fallen in last 6 months? 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  DIAGNOSTIC FINDINGS:  Lumbar x ray: Mild to moderate degenerative joint changes of lower lumbar spine.   PATIENT SURVEYS:  Modified Oswestry 14/50   COGNITION: Overall cognitive status: Within functional limits  for tasks assessed     SENSATION: WFL  MUSCLE LENGTH: Hamstrings: WFL Thomas test: negative bilat  POSTURE: anterior pelvic tilt  PALPATION: Increased mm spasticity Lt piriformis and glutes  LUMBAR ROM:   AROM eval  Flexion WFL  Extension WFL  Right lateral flexion WFL  Left lateral flexion WFL  Right rotation WFL  Left rotation WFL   (Blank rows = not tested)  LOWER EXTREMITY ROM:  grossly WFL    LOWER EXTREMITY MMT:    MMT Right eval Left eval  Hip flexion 4 4  Hip extension 4 4  Hip abduction 4+ 4-  Hip adduction    Hip internal rotation    Hip external rotation    Knee flexion 4 4  Knee extension    Ankle dorsiflexion    Ankle plantarflexion    Ankle inversion    Ankle eversion     (Blank rows = not tested)  LUMBAR SPECIAL TESTS:  Straight leg raise test: Negative and FABER test: Negative    [facility] Adult PT Treatment:                                                DATE: 12/13/23 Therapeutic Exercise/Activity: Resisted walking fwd/bkwd 15# Resisted walking laterally 10#  Bent over hip ext with knee bent x 10 bilat Side step red TB around ankles -- some increased Lt hip symptoms Pigeon stretch on mat table Sidelying clam green TB 2 x 10 Reverse clam balls between knees 2 x 10 Bridge with ball squeeze 2 x 10 SL bridge x 10 bilat Dead lift black power band x 10 Manual Therapy: STM and TPR Lt piriformis/glutes   [facility] Adult PT Treatment:  DATE: 11/30/23 Therapeutic Exercise/Activity: Sidelying clam green TB 2 x 10 Bent over hip ext with knee bent x 10 bilat Sit <> stand 10# KB x 10 Resisted walking fwd/bkwd 15# Resisted walking laterally 10# --> increased symptoms with Rt lateral walking Pigeon stretch on mat table 2 x 20 sec Manual Therapy: STM and TPR Lt piriformis/glutes   [facility] Adult PT Treatment:                                                DATE: 11/28/23 Therapeutic Exercise/Activity: Pelvic  tilts seated Supine piriformis stretch with strap 2 x 20 sec Pigeon stretch on mat table 2 x 20 sec Green TB pull down with march Pallof press red TB x 10 bilat Suitcase carry 10# KB Sit <> stand 10# KB x 10 Sidelying LT hip abd 2 x 10 Bridge 2 x 10 Manual Therapy: STM Lt glutes/piriformis CPAs UPAs grade 2-3 SIJ    PATIENT EDUCATION:  Education details: PT POC and goals, HEP Person educated: Patient Education method: Explanation, Demonstration, and Handouts Education comprehension: verbalized understanding and returned demonstration  HOME EXERCISE PROGRAM: Access Code: [email or web address] URL: [email or web address] Date: 11/30/2023 Prepared by: Lowery Rue  Exercises - Resistance Pulldown with March  - 1 x daily - 7 x weekly - 3 sets - 10 reps - Standing Anti-Rotation Press with Anchored Resistance  - 1 x daily - 7 x weekly - 2 sets - 10 reps - Standing Piriformis Release with Ball at Wall  - 1 x daily - 7 x weekly - 3 sets - 10 reps - Seated Table Piriformis Stretch  - 1 x daily - 7 x weekly - 1 sets - 3 reps - 20-30 seconds hold - Sidelying Hip Abduction  - 1 x daily - 7 x weekly - 3 sets - 10 reps - Supine Bridge  - 1 x daily - 7 x weekly - 3 sets - 10 reps - Clamshell with Resistance  - 1 x daily - 7 x weekly - 3 sets - 10 reps  ASSESSMENT:  CLINICAL IMPRESSION: Pt is making slow, steady progress. She continues with occasional symptoms but symptoms are less irritable and she is able to perform more activities without pain. Continued to progress hip and glute strength and pt has good tolerance to exercise progression today    GOALS: Goals reviewed with patient? Yes  SHORT TERM GOALS: Target date: 12/20/2023    Pt will be independent with initial HEP Baseline: Goal status: MET  2.  Pt will tolerate standing x 15 minutes with pain <= 2/10 Baseline:  Goal status: INITIAL    LONG TERM GOALS: Target date: 01/17/2024    Pt will be independent with  advanced HEP Baseline:  Goal status: INITIAL  2.  Pt will improve modified Oswestry to <= 4/50 to demo improved functional mobility Baseline:  Goal status: INITIAL  3.  Pt will improve bilat LE strength to 4+/5 to improve standing and walking tolerance Baseline:  Goal status: INITIAL  4.  Pt will tolerate standing x 30 minutes with pain <= 2/10 Baseline:  Goal status: INITIAL    PLAN:  PT FREQUENCY: 2x/week  PT DURATION: 8 weeks  PLANNED INTERVENTIONS: 97164- PT Re-evaluation, 97110-Therapeutic exercises, 97530- Therapeutic activity, [id number]- Neuromuscular re-education, 97535- Self Care, 54098- Manual therapy, [id number]- Aquatic Therapy, J1914- Electrical stimulation (unattended), 78295- Traction (mechanical),  16109- Ionotophoresis 4mg /ml Dexamethasone, Patient/Family education, Taping, Dry Needling, Cryotherapy, and Moist heat.  PLAN FOR NEXT SESSION: Update HEP, piriformis stretch, core and glute stength, manual as indicated   Seren Chaloux, PT 12/13/2023, 10:55 AM

## 2023-12-15 ENCOUNTER — Ambulatory Visit

## 2023-12-15 DIAGNOSIS — M5416 Radiculopathy, lumbar region: Secondary | ICD-10-CM

## 2023-12-15 NOTE — Therapy (Signed)
 OUTPATIENT PHYSICAL THERAPY THORACOLUMBAR TREATMENT   Patient Name: AVALIE OCONNOR MRN: [id number] DOB:09-01-1950, 73 y.o., female Today's Date: 12/15/2023  END OF SESSION:  PT End of Session - 12/15/23 1016     Visit Number 5    Number of Visits 16    Date for PT Re-Evaluation 01/17/24    Authorization Type Medicare    Authorization - Visit Number --    Progress Note Due on Visit 10    PT Start Time 1018    PT Stop Time 1102    PT Time Calculation (min) 44 min    Activity Tolerance Patient tolerated treatment well    Behavior During Therapy [facility] for tasks assessed/performed            Past Medical History:  Diagnosis Date   Allergy    Alopecia    areata   Anxiety    situational   Depression    Encounter for Medicare annual wellness exam 06/20/2023   Hyperlipidemia    Hypertension    Lung infiltrate 07/12/2008   Resolved on its own without biopsy   Menopause, premature    Migraines    MVP (mitral valve prolapse)    Past Surgical History:  Procedure Laterality Date   ABDOMINAL HYSTERECTOMY  07/12/1977   endometriosis   APPENDECTOMY     Patient Active Problem List   Diagnosis Date Noted   Left lumbar radiculitis 11/17/2023   Vaginal vault prolapse after hysterectomy 09/22/2023   SUI (stress urinary incontinence, female) 09/22/2023   Overactive bladder 09/22/2023   Encounter for screening mammogram for malignant neoplasm of breast 06/20/2023   Osteopenia 06/20/2023   Intertrigo 12/23/2022   Bronchiectasis (HCC) 12/30/2021   Sleep disturbance 05/19/2021   Degenerative disc disease, cervical 08/07/2019   Occipital neuralgia of right side 08/07/2019   Slow transit constipation 10/01/2014   Hyperlipidemia 08/11/2010   DEPRESSION, SITUATIONAL 08/11/2010   Essential hypertension 07/08/2010   ALOPECIA AREATA 05/29/2010   MENOPAUSE, PREMATURE 02/17/2007   Migraine headache 02/17/2007   MVP (mitral valve prolapse) 02/17/2007    PCP: Greer Leak  REFERRING  PROVIDER: Sandy Crumb  REFERRING DIAG: Lt lumbar radiculitis  Rationale for Evaluation and Treatment: Rehabilitation  THERAPY DIAG:  Left lumbar radiculitis  ONSET DATE: 10/31/23 (MD appt)  SUBJECTIVE:                                                                                                                                                                                           SUBJECTIVE STATEMENT: Patient reports she was feeling fine but then her back became aggravated from standing for a long time cooking dinner.  PERTINENT HISTORY:  Cervical DDD Last month the patient had a pedicure and the massage chair was on the highest setting. She felt uncomfortable and eventually turned the massage off. Since that time she has had pain in her Lt hip that goes down her Lt LE. She states that she initially had some weakness due to the pain mostly from her hip down. She states that standing and walking cause the most pain, pain decreases with sitting and lying. She has seen MD who prescribed medications which have helped decrease symptoms some but she continues with difficulty standing > 10-15 minutes.  PAIN:  Are you having pain? Yes: NPRS scale: 5/10 currently, 8-9/10 at worst Pain location: Lt hip and LE Pain description: pain Aggravating factors: standing Relieving factors: meds, sitting  PRECAUTIONS: None  RED FLAGS: None   WEIGHT BEARING RESTRICTIONS: No  FALLS:  Has patient fallen in last 6 months? 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  DIAGNOSTIC FINDINGS:  Lumbar x ray: Mild to moderate degenerative joint changes of lower lumbar spine.   PATIENT SURVEYS:  Modified Oswestry 14/50   COGNITION: Overall cognitive status: Within functional  limits for tasks assessed     SENSATION: WFL  MUSCLE LENGTH: Hamstrings: WFL Thomas test: negative bilat  POSTURE: anterior pelvic tilt  PALPATION: Increased mm spasticity Lt piriformis and glutes  LUMBAR ROM:   AROM eval  Flexion WFL  Extension WFL  Right lateral flexion WFL  Left lateral flexion WFL  Right rotation WFL  Left rotation WFL   (Blank rows = not tested)  LOWER EXTREMITY ROM:  grossly WFL    LOWER EXTREMITY MMT:    MMT Right eval Left eval  Hip flexion 4 4  Hip extension 4 4  Hip abduction 4+ 4-  Hip adduction    Hip internal rotation    Hip external rotation    Knee flexion 4 4  Knee extension    Ankle dorsiflexion    Ankle plantarflexion    Ankle inversion    Ankle eversion     (Blank rows = not tested)  LUMBAR SPECIAL TESTS:  Straight leg raise test: Negative and FABER test: Negative    [facility] Adult PT Treatment:                                                DATE: 12/15/2023 Therapeutic Exercise: Collene Dawson + ball b/w knees for stability Standing pigeon pose on table Seated figure 4 stretch Manual Therapy: STM/TPR Lt glutes, piriformis Pin & stretch Lt piriformis Neuromuscular re-ed: Bent knee fall out + GTB --> stabilizing Lt side 3x10 Side Lying: Bent knee hip abd x10 Leg lifts over & back x10   [facility] Adult PT Treatment:                                                DATE: 12/13/23 Therapeutic Exercise/Activity: Resisted walking fwd/bkwd 15# Resisted walking laterally 10#  Bent over hip ext with knee bent x 10 bilat Side step red TB  around ankles -- some increased Lt hip symptoms Pigeon stretch on mat table Sidelying clam green TB 2 x 10 Reverse clam balls between knees 2 x 10 Bridge with ball squeeze 2 x 10 SL bridge x 10 bilat Dead lift black power band x 10 Manual Therapy: STM and TPR Lt piriformis/glutes   [facility] Adult PT Treatment:                                                DATE: 11/30/23 Therapeutic  Exercise/Activity: Sidelying clam green TB 2 x 10 Bent over hip ext with knee bent x 10 bilat Sit <> stand 10# KB x 10 Resisted walking fwd/bkwd 15# Resisted walking laterally 10# --> increased symptoms with Rt lateral walking Pigeon stretch on mat table 2 x 20 sec Manual Therapy: STM and TPR Lt piriformis/glutes    PATIENT EDUCATION:  Education details: PT POC and goals, HEP Person educated: Patient Education method: Programmer, multimedia, Demonstration, and Handouts Education comprehension: verbalized understanding and returned demonstration  HOME EXERCISE PROGRAM: Access Code: [email or web address] URL: [email or web address] Date: 12/15/2023 Prepared by: Sims Duck  Exercises - Resistance Pulldown with March  - 1 x daily - 7 x weekly - 3 sets - 10 reps - Standing Anti-Rotation Press with Anchored Resistance  - 1 x daily - 7 x weekly - 2 sets - 10 reps - Standing Piriformis Release with Ball at Wall  - 1 x daily - 7 x weekly - 3 sets - 10 reps - Seated Table Piriformis Stretch  - 1 x daily - 7 x weekly - 1 sets - 3 reps - 20-30 seconds hold - Sidelying Hip Abduction  - 1 x daily - 7 x weekly - 3 sets - 10 reps - Supine Bridge  - 1 x daily - 7 x weekly - 3 sets - 10 reps - Clamshell with Resistance  - 1 x daily - 7 x weekly - 3 sets - 10 reps - Sidelying Bent Knee Lift at 45 Degrees  - 1 x daily - 7 x weekly - 3 sets - 10 reps - Sidelying Over and Back  - 1 x daily - 7 x weekly - 3 sets - 10 reps - Seated Figure 4 Piriformis Stretch  - 2 x daily - 7 x weekly - 1 sets - 5-10 reps - 10-15 sec hold  ASSESSMENT:  CLINICAL IMPRESSION: Significant tenderness with palpitation in Lt glutes, primarily at piriformis; decreased symptoms reported after manual interventions. Glute strengthening progressed with side lying hip abduction variations; tactile cues provided to improve core stability and pelvic alignment. HEP updated with glute exercises and piriformis stretch.    GOALS: Goals  reviewed with patient? Yes  SHORT TERM GOALS: Target date: 12/20/2023  Pt will be independent with initial HEP Baseline: Goal status: MET  2.  Pt will tolerate standing x 15 minutes with pain <= 2/10 Baseline:  Goal status: INITIAL    LONG TERM GOALS: Target date: 01/17/2024  Pt will be independent with advanced HEP Baseline:  Goal status: INITIAL  2.  Pt will improve modified Oswestry to <= 4/50 to demo improved functional mobility Baseline:  Goal status: INITIAL  3.  Pt will improve bilat LE strength to 4+/5 to improve standing and walking tolerance Baseline:  Goal status: INITIAL  4.  Pt will tolerate standing x 30 minutes with pain <=  2/10 Baseline:  Goal status: INITIAL    PLAN:  PT FREQUENCY: 2x/week  PT DURATION: 8 weeks  PLANNED INTERVENTIONS: 97164- PT Re-evaluation, 97110-Therapeutic exercises, 97530- Therapeutic activity, 97112- Neuromuscular re-education, 97535- Self Care, 14782- Manual therapy, [id number]- Aquatic Therapy, N5621- Electrical stimulation (unattended), [id number]- Traction (mechanical), [id number]- Ionotophoresis 4mg /ml Dexamethasone, Patient/Family education, Taping, Dry Needling, Cryotherapy, and Moist heat.  PLAN FOR NEXT SESSION: Update STGs next visit. piriformis stretch, core and glute stength, manual as indicated   Flint Hummer, PTA 12/15/2023, 11:02 AM

## 2023-12-20 ENCOUNTER — Ambulatory Visit: Admitting: Physical Therapy

## 2023-12-20 ENCOUNTER — Encounter: Payer: Self-pay | Admitting: Physical Therapy

## 2023-12-20 ENCOUNTER — Encounter (INDEPENDENT_AMBULATORY_CARE_PROVIDER_SITE_OTHER): Payer: Self-pay | Admitting: Sports Medicine

## 2023-12-20 DIAGNOSIS — M5416 Radiculopathy, lumbar region: Secondary | ICD-10-CM

## 2023-12-20 NOTE — Therapy (Signed)
 OUTPATIENT PHYSICAL THERAPY THORACOLUMBAR TREATMENT   Patient Name: Stephanie Stone MRN: [id number] DOB:04/10/1951, 73 y.o., female Today's Date: 12/20/2023  END OF SESSION:  PT End of Session - 12/20/23 1302     Visit Number 6    Number of Visits 16    Date for PT Re-Evaluation 01/17/24    Authorization Type Medicare    Authorization - Visit Number 6    Progress Note Due on Visit 10    PT Start Time 1015    PT Stop Time 1058    PT Time Calculation (min) 43 min    Activity Tolerance Patient tolerated treatment well    Behavior During Therapy [facility] for tasks assessed/performed             Past Medical History:  Diagnosis Date   Allergy    Alopecia    areata   Anxiety    situational   Depression    Encounter for Medicare annual wellness exam 06/20/2023   Hyperlipidemia    Hypertension    Lung infiltrate 07/12/2008   Resolved on its own without biopsy   Menopause, premature    Migraines    MVP (mitral valve prolapse)    Past Surgical History:  Procedure Laterality Date   ABDOMINAL HYSTERECTOMY  07/12/1977   endometriosis   APPENDECTOMY     Patient Active Problem List   Diagnosis Date Noted   Left lumbar radiculitis 11/17/2023   Vaginal vault prolapse after hysterectomy 09/22/2023   SUI (stress urinary incontinence, female) 09/22/2023   Overactive bladder 09/22/2023   Encounter for screening mammogram for malignant neoplasm of breast 06/20/2023   Osteopenia 06/20/2023   Intertrigo 12/23/2022   Bronchiectasis (HCC) 12/30/2021   Sleep disturbance 05/19/2021   Degenerative disc disease, cervical 08/07/2019   Occipital neuralgia of right side 08/07/2019   Slow transit constipation 10/01/2014   Hyperlipidemia 08/11/2010   DEPRESSION, SITUATIONAL 08/11/2010   Essential hypertension 07/08/2010   ALOPECIA AREATA 05/29/2010   MENOPAUSE, PREMATURE 02/17/2007   Migraine headache 02/17/2007   MVP (mitral valve prolapse) 02/17/2007    PCP: Greer Leak  REFERRING  PROVIDER: Sandy Crumb  REFERRING DIAG: Lt lumbar radiculitis  Rationale for Evaluation and Treatment: Rehabilitation  THERAPY DIAG:  Left lumbar radiculitis  ONSET DATE: 10/31/23 (MD appt)  SUBJECTIVE:                                                                                                                                                                                           SUBJECTIVE STATEMENT: Patient reports she has had more pain since Sunday. She states she changed the sheets on the beds but that  is all that was "different" from her usual activities. She has increased pain with going up/down stairs and the pain does not go away with sitting like it usually does  PERTINENT HISTORY:  Cervical DDD Last month the patient had a pedicure and the massage chair was on the highest setting. She felt uncomfortable and eventually turned the massage off. Since that time she has had pain in her Lt hip that goes down her Lt LE. She states that she initially had some weakness due to the pain mostly from her hip down. She states that standing and walking cause the most pain, pain decreases with sitting and lying. She has seen MD who prescribed medications which have helped decrease symptoms some but she continues with difficulty standing > 10-15 minutes.  PAIN:  Are you having pain? Yes: NPRS scale: 5/10 currently, 8-9/10 at worst Pain location: Lt hip and LE Pain description: pain Aggravating factors: standing Relieving factors: meds, sitting  PRECAUTIONS: None  RED FLAGS: None   WEIGHT BEARING RESTRICTIONS: No  FALLS:  Has patient fallen in last 6 months? 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  DIAGNOSTIC FINDINGS:  Lumbar x ray: Mild to moderate  degenerative joint changes of lower lumbar spine.   PATIENT SURVEYS:  Modified Oswestry 14/50   COGNITION: Overall cognitive status: Within functional limits for tasks assessed     SENSATION: WFL  MUSCLE LENGTH: Hamstrings: WFL Thomas test: negative bilat  POSTURE: anterior pelvic tilt  PALPATION: Increased mm spasticity Lt piriformis and glutes  LUMBAR ROM:   AROM eval  Flexion WFL  Extension WFL  Right lateral flexion WFL  Left lateral flexion WFL  Right rotation WFL  Left rotation WFL   (Blank rows = not tested)  LOWER EXTREMITY ROM:  grossly WFL    LOWER EXTREMITY MMT:    MMT Right eval Left eval  Hip flexion 4 4  Hip extension 4 4  Hip abduction 4+ 4-  Hip adduction    Hip internal rotation    Hip external rotation    Knee flexion 4 4  Knee extension    Ankle dorsiflexion    Ankle plantarflexion    Ankle inversion    Ankle eversion     (Blank rows = not tested)  LUMBAR SPECIAL TESTS:  Straight leg raise test: Negative and FABER test: Negative    [facility] Adult PT Treatment:                                                DATE: 12/20/23 Therapeutic Exercise/Activity: Sidelying Bent knee hip abd 2 x 10 Leg lifts over and back x 10 Supine Bridge with ball squeeze 2 x 10 Clam green TB with Lt LE stabilizing 2 x 10 Standing Side step green TB Static stance on rocker board -- much increase in symptoms Squat on foam Hip hike 2'' step - no increased symptoms Manual Therapy: STM/TPR Lt glutes, piriformis Pin & stretch Lt piriformis   [facility] Adult PT Treatment:  DATE: 12/15/2023 Therapeutic Exercise: Collene Dawson + ball b/w knees for stability Standing pigeon pose on table Seated figure 4 stretch Manual Therapy: STM/TPR Lt glutes, piriformis Pin & stretch Lt piriformis Neuromuscular re-ed: Bent knee fall out + GTB --> stabilizing Lt side 3x10 Side Lying: Bent knee hip abd x10 Leg lifts over & back  x10   [facility] Adult PT Treatment:                                                DATE: 12/13/23 Therapeutic Exercise/Activity: Resisted walking fwd/bkwd 15# Resisted walking laterally 10#  Bent over hip ext with knee bent x 10 bilat Side step red TB around ankles -- some increased Lt hip symptoms Pigeon stretch on mat table Sidelying clam green TB 2 x 10 Reverse clam balls between knees 2 x 10 Bridge with ball squeeze 2 x 10 SL bridge x 10 bilat Dead lift black power band x 10 Manual Therapy: STM and TPR Lt piriformis/glutes   PATIENT EDUCATION:  Education details: PT POC and goals, HEP Person educated: Patient Education method: Explanation, Demonstration, and Handouts Education comprehension: verbalized understanding and returned demonstration  HOME EXERCISE PROGRAM: Access Code: [email or web address] URL: [email or web address] Date: 12/20/2023 Prepared by: Lowery Rue  Exercises - Resistance Pulldown with March  - 1 x daily - 7 x weekly - 3 sets - 10 reps - Standing Anti-Rotation Press with Anchored Resistance  - 1 x daily - 7 x weekly - 2 sets - 10 reps - Standing Piriformis Release with Ball at Wall  - 1 x daily - 7 x weekly - 3 sets - 10 reps - Seated Table Piriformis Stretch  - 1 x daily - 7 x weekly - 1 sets - 3 reps - 20-30 seconds hold - Sidelying Hip Abduction  - 1 x daily - 7 x weekly - 3 sets - 10 reps - Supine Bridge  - 1 x daily - 7 x weekly - 3 sets - 10 reps - Clamshell with Resistance  - 1 x daily - 7 x weekly - 3 sets - 10 reps - Sidelying Bent Knee Lift at 45 Degrees  - 1 x daily - 7 x weekly - 3 sets - 10 reps - Sidelying Over and Back  - 1 x daily - 7 x weekly - 3 sets - 10 reps - Seated Figure 4 Piriformis Stretch  - 2 x daily - 7 x weekly - 1 sets - 5-10 reps - 10-15 sec hold - Squat on BOSU Ball  - 1 x daily - 7 x weekly - 3 sets - 10 reps  ASSESSMENT:  CLINICAL IMPRESSION: Pt with recent flare up in symptoms with no known cause. She was able to  perform side stepping today without increase in symptoms but has flare up with static stance on rocker board. Encouraged pt to try more unstable surfaces at home in an tolerable way to improve hip strength and stability    GOALS: Goals reviewed with patient? Yes  SHORT TERM GOALS: Target date: 12/20/2023  Pt will be independent with initial HEP Baseline: Goal status: MET  2.  Pt will tolerate standing x 15 minutes with pain <= 2/10 Baseline:  Goal status: IN PROGRESS    LONG TERM GOALS: Target date: 01/17/2024  Pt will be independent with advanced HEP Baseline:  Goal status: INITIAL  2.  Pt will improve modified Oswestry to <= 4/50 to demo improved functional mobility Baseline:  Goal status: INITIAL  3.  Pt will improve bilat LE strength to 4+/5 to improve standing and walking tolerance Baseline:  Goal status: INITIAL  4.  Pt will tolerate standing x 30 minutes with pain <= 2/10 Baseline:  Goal status: INITIAL    PLAN:  PT FREQUENCY: 2x/week  PT DURATION: 8 weeks  PLANNED INTERVENTIONS: 97164- PT Re-evaluation, 97110-Therapeutic exercises, 97530- Therapeutic activity, 97112- Neuromuscular re-education, 97535- Self Care, 59563- Manual therapy, [id number]- Aquatic Therapy, O7564- Electrical stimulation (unattended), [id number]- Traction (mechanical), [id number]- Ionotophoresis 4mg /ml Dexamethasone, Patient/Family education, Taping, Dry Needling, Cryotherapy, and Moist heat.  PLAN FOR NEXT SESSION:  core and glute strength and stability, manual as indicated   Morganna Styles, PT 12/20/2023, 1:03 PM

## 2023-12-21 NOTE — Telephone Encounter (Signed)

## 2023-12-22 ENCOUNTER — Ambulatory Visit

## 2023-12-22 DIAGNOSIS — M5416 Radiculopathy, lumbar region: Secondary | ICD-10-CM

## 2023-12-22 NOTE — Therapy (Signed)
 OUTPATIENT PHYSICAL THERAPY THORACOLUMBAR TREATMENT   Patient Name: Stephanie Stone MRN: [id number] DOB:05-24-1951, 73 y.o., female Today's Date: 12/22/2023  END OF SESSION:  PT End of Session - 12/22/23 1138     Visit Number 7    Number of Visits 16    Date for PT Re-Evaluation 01/17/24    Authorization Type Medicare    Authorization - Visit Number 7    Progress Note Due on Visit 10    PT Start Time 1016    PT Stop Time 1100    PT Time Calculation (min) 44 min    Activity Tolerance Patient tolerated treatment well    Behavior During Therapy [facility] for tasks assessed/performed           Past Medical History:  Diagnosis Date   Allergy    Alopecia    areata   Anxiety    situational   Depression    Encounter for Medicare annual wellness exam 06/20/2023   Hyperlipidemia    Hypertension    Lung infiltrate 07/12/2008   Resolved on its own without biopsy   Menopause, premature    Migraines    MVP (mitral valve prolapse)    Past Surgical History:  Procedure Laterality Date   ABDOMINAL HYSTERECTOMY  07/12/1977   endometriosis   APPENDECTOMY     Patient Active Problem List   Diagnosis Date Noted   Left lumbar radiculitis 11/17/2023   Vaginal vault prolapse after hysterectomy 09/22/2023   SUI (stress urinary incontinence, female) 09/22/2023   Overactive bladder 09/22/2023   Encounter for screening mammogram for malignant neoplasm of breast 06/20/2023   Osteopenia 06/20/2023   Intertrigo 12/23/2022   Bronchiectasis (HCC) 12/30/2021   Sleep disturbance 05/19/2021   Degenerative disc disease, cervical 08/07/2019   Occipital neuralgia of right side 08/07/2019   Slow transit constipation 10/01/2014   Hyperlipidemia 08/11/2010   DEPRESSION, SITUATIONAL 08/11/2010   Essential hypertension 07/08/2010   ALOPECIA AREATA 05/29/2010   MENOPAUSE, PREMATURE 02/17/2007   Migraine headache 02/17/2007   MVP (mitral valve prolapse) 02/17/2007    PCP: Greer Leak  REFERRING PROVIDER:  Sandy Crumb  REFERRING DIAG: Lt lumbar radiculitis  Rationale for Evaluation and Treatment: Rehabilitation  THERAPY DIAG:  Left lumbar radiculitis  ONSET DATE: 10/31/23 (MD appt)  SUBJECTIVE:                                                                                                                                                                                           SUBJECTIVE STATEMENT: Patient returned to the clinic stating that she has continued to have more elevated pain since the weekned. Normally, the pain feels  better when she sits after activity almost immediately, but now it is taking more time before the pain subsides. She is still comfortable lying down, but being on her feet for prolonged periods still is increasing her pain. She is trying to remain active but taking breaks as needed.    PERTINENT HISTORY:  Cervical DDD Last month the patient had a pedicure and the massage chair was on the highest setting. She felt uncomfortable and eventually turned the massage off. Since that time she has had pain in her Lt hip that goes down her Lt LE. She states that she initially had some weakness due to the pain mostly from her hip down. She states that standing and walking cause the most pain, pain decreases with sitting and lying. She has seen MD who prescribed medications which have helped decrease symptoms some but she continues with difficulty standing > 10-15 minutes.  PAIN:  Are you having pain? Yes: NPRS scale: 5/10 currently, 8-9/10 at worst Pain location: Lt hip and LE Pain description: pain Aggravating factors: standing Relieving factors: meds, sitting  PRECAUTIONS: None  RED FLAGS: None   WEIGHT BEARING RESTRICTIONS: No  FALLS:  Has patient fallen in last 6 months? 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  DIAGNOSTIC FINDINGS:  Lumbar x ray: Mild to moderate degenerative joint changes of lower lumbar spine.   PATIENT SURVEYS:  Modified Oswestry 14/50   COGNITION: Overall cognitive status: Within functional limits for tasks assessed     SENSATION: WFL  MUSCLE LENGTH: Hamstrings: WFL Thomas test: negative bilat  POSTURE: anterior pelvic tilt  PALPATION: Increased mm spasticity Lt piriformis and glutes  LUMBAR ROM:   AROM eval  Flexion WFL  Extension WFL  Right lateral flexion WFL  Left lateral flexion WFL  Right rotation WFL  Left rotation WFL   (Blank rows = not tested)  LOWER EXTREMITY ROM:  grossly WFL    LOWER EXTREMITY MMT:    MMT Right eval Left eval  Hip flexion 4 4  Hip extension 4 4  Hip abduction 4+ 4-  Hip adduction    Hip internal rotation    Hip external rotation    Knee flexion 4 4  Knee extension    Ankle dorsiflexion    Ankle plantarflexion    Ankle inversion    Ankle eversion     (Blank rows = not tested)  LUMBAR SPECIAL TESTS:  Straight leg raise test: Negative and FABER test: Negative  [facility] Adult PT Treatment:                                                DATE: 12/22/2023 Therapeutic Exercise: Hooklying: Performed/instructed self-soft tissue mobilization for the L piriformis and self-unilateral lumbar mobilization using tennis ball Manual Therapy: R sidelying: Soft tissue mobilization to L piriformis region L unilateral PA glides at lumbar transverse processes, multiple segments Neuromuscular re-ed: Hooklying: Adductor squeeze, 3 x 10 sec Isometric hip flexion against therapist resistance, R and L 3 x 10 sec Cross connect (knee pressing into contralateral hand) + adductor squeeze, R and L, 3 x 10 sec Cross connect, R and L, 3 x  10  [facility] Adult PT Treatment:                                                DATE: 12/20/23 Therapeutic  Exercise/Activity: Sidelying Bent knee hip abd 2 x 10 Leg lifts over and back x 10 Supine Bridge with ball squeeze 2 x 10 Clam green TB with Lt LE stabilizing 2 x 10 Standing Side step green TB Static stance on rocker board -- much increase in symptoms Squat on foam Hip hike 2'' step - no increased symptoms Manual Therapy: STM/TPR Lt glutes, piriformis Pin & stretch Lt piriformis   [facility] Adult PT Treatment:                                                DATE: 12/15/2023 Therapeutic Exercise: Collene Dawson + ball b/w knees for stability Standing pigeon pose on table Seated figure 4 stretch Manual Therapy: STM/TPR Lt glutes, piriformis Pin & stretch Lt piriformis Neuromuscular re-ed: Bent knee fall out + GTB --> stabilizing Lt side 3x10 Side Lying: Bent knee hip abd x10 Leg lifts over & back x10  PATIENT EDUCATION:  Education details: PT POC and goals, HEP Person educated: Patient Education method: Explanation, Demonstration, and Handouts Education comprehension: verbalized understanding and returned demonstration  HOME EXERCISE PROGRAM: Access Code: [email or web address] URL: [email or web address] Date: 12/22/2023 Prepared by: Jeannette Mills  Exercises - Resistance Pulldown with March  - 1 x daily - 7 x weekly - 3 sets - 10 reps - Standing Anti-Rotation Press with Anchored Resistance  - 1 x daily - 7 x weekly - 2 sets - 10 reps - Standing Piriformis Release with Ball at Wall  - 1 x daily - 7 x weekly - 3 sets - 10 reps - Seated Table Piriformis Stretch  - 1 x daily - 7 x weekly - 1 sets - 3 reps - 20-30 seconds hold - Sidelying Hip Abduction  - 1 x daily - 7 x weekly - 3 sets - 10 reps - Supine Bridge  - 1 x daily - 7 x weekly - 3 sets - 10 reps - Clamshell with Resistance  - 1 x daily - 7 x weekly - 3 sets - 10 reps - Sidelying Bent Knee Lift at 45 Degrees  - 1 x daily - 7 x weekly - 3 sets - 10 reps - Sidelying Over and Back  - 1 x daily - 7 x weekly - 3 sets - 10 reps -  Seated Figure 4 Piriformis Stretch  - 2 x daily - 7 x weekly - 1 sets - 5-10 reps - 10-15 sec hold - Squat on BOSU Ball  - 1 x daily - 7 x weekly - 3 sets - 10 reps - Oblique Crunch  - 1 x daily - 7 x weekly - 5 reps - 10 sec hold - Hooklying Lower Trunk Rotation with Small Ball at Piriformis or Spinal Muscles  - 1 x daily - 7 x weekly - 30-60 reps  ASSESSMENT:  CLINICAL IMPRESSION: Symptoms were reproduced today with palpation to the L piriformis region and at the L transverse process regions at the lumbar spine. Used manual therapy interventions at these areas to help with pain and instructed  self-mobilization techniques for home use. Unable to elicit pain with resisted testing of the hip musculature or with PROM of the hips. Pain was reproduced with rotating the trunk to the L in hooklying through the lower extremities. Exercises involving isometric hip flexion seemed to resolve the pain with this motion - adduction based movements did not seem to be effective. Physical therapy remains indicated to continue decreasing symptoms and improving function.  GOALS: Goals reviewed with patient? Yes  SHORT TERM GOALS: Target date: 12/20/2023  Pt will be independent with initial HEP Baseline: Goal status: MET  2.  Pt will tolerate standing x 15 minutes with pain <= 2/10 Baseline:  Goal status: IN PROGRESS    LONG TERM GOALS: Target date: 01/17/2024  Pt will be independent with advanced HEP Baseline:  Goal status: INITIAL  2.  Pt will improve modified Oswestry to <= 4/50 to demo improved functional mobility Baseline:  Goal status: INITIAL  3.  Pt will improve bilat LE strength to 4+/5 to improve standing and walking tolerance Baseline:  Goal status: INITIAL  4.  Pt will tolerate standing x 30 minutes with pain <= 2/10 Baseline:  Goal status: INITIAL    PLAN:  PT FREQUENCY: 2x/week  PT DURATION: 8 weeks  PLANNED INTERVENTIONS: 97164- PT Re-evaluation, 97110-Therapeutic  exercises, 97530- Therapeutic activity, 97112- Neuromuscular re-education, 97535- Self Care, 16109- Manual therapy, [id number]- Aquatic Therapy, U0454- Electrical stimulation (unattended), [id number]- Traction (mechanical), [id number]- Ionotophoresis 4mg /ml Dexamethasone, Patient/Family education, Taping, Dry Needling, Cryotherapy, and Moist heat.  PLAN FOR NEXT SESSION:  core and glute strength and stability, manual as indicated   Zoe Hinds, PT 12/22/2023, 12:09 PM

## 2023-12-27 ENCOUNTER — Ambulatory Visit: Admitting: Physical Therapy

## 2023-12-27 ENCOUNTER — Encounter: Payer: Self-pay | Admitting: Physical Therapy

## 2023-12-27 DIAGNOSIS — M5416 Radiculopathy, lumbar region: Secondary | ICD-10-CM | POA: Diagnosis not present

## 2023-12-27 NOTE — Therapy (Signed)
 OUTPATIENT PHYSICAL THERAPY THORACOLUMBAR TREATMENT   Patient Name: Stephanie Stone MRN: [id number] DOB:26-Nov-1950, 73 y.o., female Today's Date: 12/27/2023  END OF SESSION:  PT End of Session - 12/27/23 1058     Visit Number 8    Number of Visits 16    Date for PT Re-Evaluation 01/17/24    Authorization Type Medicare    Authorization - Visit Number 8    Progress Note Due on Visit 10    PT Start Time 1015    PT Stop Time 1059    PT Time Calculation (min) 44 min    Activity Tolerance Patient tolerated treatment well    Behavior During Therapy [facility] for tasks assessed/performed            Past Medical History:  Diagnosis Date   Allergy    Alopecia    areata   Anxiety    situational   Depression    Encounter for Medicare annual wellness exam 06/20/2023   Hyperlipidemia    Hypertension    Lung infiltrate 07/12/2008   Resolved on its own without biopsy   Menopause, premature    Migraines    MVP (mitral valve prolapse)    Past Surgical History:  Procedure Laterality Date   ABDOMINAL HYSTERECTOMY  07/12/1977   endometriosis   APPENDECTOMY     Patient Active Problem List   Diagnosis Date Noted   Left lumbar radiculitis 11/17/2023   Vaginal vault prolapse after hysterectomy 09/22/2023   SUI (stress urinary incontinence, female) 09/22/2023   Overactive bladder 09/22/2023   Encounter for screening mammogram for malignant neoplasm of breast 06/20/2023   Osteopenia 06/20/2023   Intertrigo 12/23/2022   Bronchiectasis (HCC) 12/30/2021   Sleep disturbance 05/19/2021   Degenerative disc disease, cervical 08/07/2019   Occipital neuralgia of right side 08/07/2019   Slow transit constipation 10/01/2014   Hyperlipidemia 08/11/2010   DEPRESSION, SITUATIONAL 08/11/2010   Essential hypertension 07/08/2010   ALOPECIA AREATA 05/29/2010   MENOPAUSE, PREMATURE 02/17/2007   Migraine headache 02/17/2007   MVP (mitral valve prolapse) 02/17/2007    PCP: Greer Leak  REFERRING  PROVIDER: Sandy Crumb  REFERRING DIAG: Lt lumbar radiculitis  Rationale for Evaluation and Treatment: Rehabilitation  THERAPY DIAG:  Left lumbar radiculitis  ONSET DATE: 10/31/23 (MD appt)  SUBJECTIVE:                                                                                                                                                                                           SUBJECTIVE STATEMENT: Patient states she is having some radiating pain even when sitting down. She still is unable to stand for very long  due to pain. She states she now can sometimes feel pain when she gets up in the morning which is new    PERTINENT HISTORY:  Cervical DDD Last month the patient had a pedicure and the massage chair was on the highest setting. She felt uncomfortable and eventually turned the massage off. Since that time she has had pain in her Lt hip that goes down her Lt LE. She states that she initially had some weakness due to the pain mostly from her hip down. She states that standing and walking cause the most pain, pain decreases with sitting and lying. She has seen MD who prescribed medications which have helped decrease symptoms some but she continues with difficulty standing > 10-15 minutes.  PAIN:  Are you having pain? Yes: NPRS scale: 5/10 currently, 8-9/10 at worst Pain location: Lt hip and LE Pain description: pain Aggravating factors: standing Relieving factors: meds, sitting  PRECAUTIONS: None  RED FLAGS: None   WEIGHT BEARING RESTRICTIONS: No  FALLS:  Has patient fallen in last 6 months? 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  DIAGNOSTIC FINDINGS:  Lumbar x ray: Mild to moderate degenerative joint changes of lower lumbar spine.   PATIENT  SURVEYS:  Modified Oswestry 14/50   COGNITION: Overall cognitive status: Within functional limits for tasks assessed     SENSATION: WFL  MUSCLE LENGTH: Hamstrings: WFL Thomas test: negative bilat  POSTURE: anterior pelvic tilt  PALPATION: Increased mm spasticity Lt piriformis and glutes  LUMBAR ROM:   AROM eval  Flexion WFL  Extension WFL  Right lateral flexion WFL  Left lateral flexion WFL  Right rotation WFL  Left rotation WFL   (Blank rows = not tested)  LOWER EXTREMITY ROM:  grossly WFL    LOWER EXTREMITY MMT:    MMT Right eval Left eval  Hip flexion 4 4  Hip extension 4 4  Hip abduction 4+ 4-  Hip adduction    Hip internal rotation    Hip external rotation    Knee flexion 4 4  Knee extension    Ankle dorsiflexion    Ankle plantarflexion    Ankle inversion    Ankle eversion     (Blank rows = not tested)  LUMBAR SPECIAL TESTS:  Straight leg raise test: Negative and FABER test: Negative  [facility] Adult PT Treatment:                                                DATE: 12/27/23  Manual Therapy: STM Lt lumbar paraspinals PA mobs Lt lumbar transverse processes Neuromuscular re-ed: Seated sciatic nerve stretch - no change in symptoms Supine sciatic nerve stretch - no change in symptoms Oblique isometric 5 x 3 sec hold bilat Hip add squeeze with LAQ 2 x 10 Seated hip abd/ER green TB 1 LE at a time Alternating between standing and Seated on physioball Pelvic tilt Hip circles - some increased symptoms   [facility] Adult PT Treatment:  DATE: 12/22/2023 Therapeutic Exercise: Hooklying: Performed/instructed self-soft tissue mobilization for the L piriformis and self-unilateral lumbar mobilization using tennis ball Manual Therapy: R sidelying: Soft tissue mobilization to L piriformis region L unilateral PA glides at lumbar transverse processes, multiple segments Neuromuscular re-ed: Hooklying: Adductor squeeze,  3 x 10 sec Isometric hip flexion against therapist resistance, R and L 3 x 10 sec Cross connect (knee pressing into contralateral hand) + adductor squeeze, R and L, 3 x 10 sec Cross connect, R and L, 3 x 10  [facility] Adult PT Treatment:                                                DATE: 12/20/23 Therapeutic Exercise/Activity: Sidelying Bent knee hip abd 2 x 10 Leg lifts over and back x 10 Supine Bridge with ball squeeze 2 x 10 Clam green TB with Lt LE stabilizing 2 x 10 Standing Side step green TB Static stance on rocker board -- much increase in symptoms Squat on foam Hip hike 2'' step - no increased symptoms Manual Therapy: STM/TPR Lt glutes, piriformis Pin & stretch Lt piriformis   PATIENT EDUCATION:  Education details: PT POC and goals, HEP Person educated: Patient Education method: Explanation, Demonstration, and Handouts Education comprehension: verbalized understanding and returned demonstration  HOME EXERCISE PROGRAM: Access Code: [email or web address] URL: [email or web address] Date: 12/22/2023 Prepared by: Jeannette Mills  Exercises - Resistance Pulldown with March  - 1 x daily - 7 x weekly - 3 sets - 10 reps - Standing Anti-Rotation Press with Anchored Resistance  - 1 x daily - 7 x weekly - 2 sets - 10 reps - Standing Piriformis Release with Ball at Wall  - 1 x daily - 7 x weekly - 3 sets - 10 reps - Seated Table Piriformis Stretch  - 1 x daily - 7 x weekly - 1 sets - 3 reps - 20-30 seconds hold - Sidelying Hip Abduction  - 1 x daily - 7 x weekly - 3 sets - 10 reps - Supine Bridge  - 1 x daily - 7 x weekly - 3 sets - 10 reps - Clamshell with Resistance  - 1 x daily - 7 x weekly - 3 sets - 10 reps - Sidelying Bent Knee Lift at 45 Degrees  - 1 x daily - 7 x weekly - 3 sets - 10 reps - Sidelying Over and Back  - 1 x daily - 7 x weekly - 3 sets - 10 reps - Seated Figure 4 Piriformis Stretch  - 2 x daily - 7 x weekly - 1 sets - 5-10 reps - 10-15 sec hold - Squat on  BOSU Ball  - 1 x daily - 7 x weekly - 3 sets - 10 reps - Oblique Crunch  - 1 x daily - 7 x weekly - 5 reps - 10 sec hold - Hooklying Lower Trunk Rotation with Small Ball at Piriformis or Spinal Muscles  - 1 x daily - 7 x weekly - 30-60 reps  ASSESSMENT:  CLINICAL IMPRESSION: Pt mentions she has a known leg length discrepancy. PT issued a heel lift at end of session and educated pt on it's use. Next visit is pt's last PT visit prior to MD appointment and MRI  GOALS: Goals reviewed with patient? Yes  SHORT TERM GOALS: Target date: 12/20/2023  Pt will be independent with  initial HEP Baseline: Goal status: MET  2.  Pt will tolerate standing x 15 minutes with pain <= 2/10 Baseline:  Goal status: IN PROGRESS    LONG TERM GOALS: Target date: 01/17/2024  Pt will be independent with advanced HEP Baseline:  Goal status: INITIAL  2.  Pt will improve modified Oswestry to <= 4/50 to demo improved functional mobility Baseline:  Goal status: INITIAL  3.  Pt will improve bilat LE strength to 4+/5 to improve standing and walking tolerance Baseline:  Goal status: INITIAL  4.  Pt will tolerate standing x 30 minutes with pain <= 2/10 Baseline:  Goal status: INITIAL    PLAN:  PT FREQUENCY: 2x/week  PT DURATION: 8 weeks  PLANNED INTERVENTIONS: 97164- PT Re-evaluation, 97110-Therapeutic exercises, 97530- Therapeutic activity, 97112- Neuromuscular re-education, 97535- Self Care, 14782- Manual therapy, [id number]- Aquatic Therapy, N5621- Electrical stimulation (unattended), [id number]- Traction (mechanical), [id number]- Ionotophoresis 4mg /ml Dexamethasone, Patient/Family education, Taping, Dry Needling, Cryotherapy, and Moist heat.  PLAN FOR NEXT SESSION:  core and glute strength and stability, manual as indicated   Eiden Bagot, PT 12/27/2023, 10:59 AM

## 2023-12-29 ENCOUNTER — Ambulatory Visit

## 2023-12-29 ENCOUNTER — Ambulatory Visit (INDEPENDENT_AMBULATORY_CARE_PROVIDER_SITE_OTHER): Admitting: Sports Medicine

## 2023-12-29 DIAGNOSIS — M5416 Radiculopathy, lumbar region: Secondary | ICD-10-CM

## 2023-12-29 MED ORDER — PREDNISONE 50 MG PO TABS: ORAL_TABLET | ORAL | 0 refills | Status: DC

## 2023-12-29 NOTE — Progress Notes (Signed)
    Procedures performed today:    None.  Independent interpretation of notes and tests performed by another provider:   None.  Brief History, Exam, Impression, and Recommendations:    Left lumbar radiculitis This very pleasant 73 year old female returns, she continues to have axial back pain rating down left posterior buttock, back of the leg to the bottom of the left foot. She has had physical therapy, max dose Celebrex , gabapentin  currently taken at 200 in the morning and 300 at night, continues to have discomfort. At this point we will proceed with MRI for interventional planning, likely left L5-S1 transforaminal epidural. She does have a big beach trip coming up with her entire family and will not be back until next week. I am going to do a burst of prednisone  to help ensure the strip is comfortable for her. She does prefer to follow-up with me to go over the MRI results before I order an epidural.    ____________________________________________ Joselyn Nicely. Sandy Crumb, M.D., ABFM., CAQSM., AME. Primary Care and Sports Medicine  [facility]  Adjunct Professor of [facility] [employer] [facility] Medicine  [employer]  Restaurant manager, fast food

## 2023-12-29 NOTE — Therapy (Addendum)
 OUTPATIENT PHYSICAL THERAPY THORACOLUMBAR TREATMENT PHYSICAL THERAPY DISCHARGE SUMMARY  Visits from Start of Care: 9  Current functional level related to goals / functional outcomes: See progress note for discharge status    Remaining deficits: Unknown    Education / Equipment: HEP    Patient agrees to discharge. Patient goals were partially met. Patient is being discharged due to not returning since the last visit.  Stephanie Stone PT, MPH 03/22/24 3:11 PM For Stephanie Stone, PT    Patient Name: Stephanie Stone MRN: [id number] DOB:07/16/1950, 73 y.o., female Today's Date: 12/29/2023  END OF SESSION:  PT End of Session - 12/29/23 1315     Visit Number 9    Number of Visits 16    Date for PT Re-Evaluation 01/17/24    Authorization Type Medicare    Progress Note Due on Visit 10    PT Start Time 1315            Past Medical History:  Diagnosis Date   Allergy    Alopecia    areata   Anxiety    situational   Depression    Encounter for Medicare annual wellness exam 06/20/2023   Hyperlipidemia    Hypertension    Lung infiltrate 07/12/2008   Resolved on its own without biopsy   Menopause, premature    Migraines    MVP (mitral valve prolapse)    Past Surgical History:  Procedure Laterality Date   ABDOMINAL HYSTERECTOMY  07/12/1977   endometriosis   APPENDECTOMY     Patient Active Problem List   Diagnosis Date Noted   Left lumbar radiculitis 11/17/2023   Vaginal vault prolapse after hysterectomy 09/22/2023   SUI (stress urinary incontinence, female) 09/22/2023   Overactive bladder 09/22/2023   Encounter for screening mammogram for malignant neoplasm of breast 06/20/2023   Osteopenia 06/20/2023   Intertrigo 12/23/2022   Bronchiectasis (HCC) 12/30/2021   Sleep disturbance 05/19/2021   Degenerative disc disease, cervical 08/07/2019   Occipital neuralgia of right side 08/07/2019   Slow transit constipation 10/01/2014   Hyperlipidemia 08/11/2010   DEPRESSION,  SITUATIONAL 08/11/2010   Essential hypertension 07/08/2010   ALOPECIA AREATA 05/29/2010   MENOPAUSE, PREMATURE 02/17/2007   Migraine headache 02/17/2007   MVP (mitral valve prolapse) 02/17/2007    PCP: Stephanie Stone  REFERRING PROVIDER: Curtis  REFERRING DIAG: Lt lumbar radiculitis  Rationale for Evaluation and Treatment: Rehabilitation  THERAPY DIAG:  No diagnosis found.  ONSET DATE: 10/31/23 (MD appt)  SUBJECTIVE:  SUBJECTIVE STATEMENT: The patient returned to the clinic stating that her symptoms have remained elevated, and she still is having with pain when sitting to where it takes longer before she feels relief. She stated that now when she is on her feet, the pain returns almost immediately. Her gait is observed to be more antalgic today as well. The patient has a follow up with Dr. Derrek following today's session.  PERTINENT HISTORY:  Cervical DDD Last month the patient had a pedicure and the massage chair was on the highest setting. She felt uncomfortable and eventually turned the massage off. Since that time she has had pain in her Lt hip that goes down her Lt LE. She states that she initially had some weakness due to the pain mostly from her hip down. She states that standing and walking cause the most pain, pain decreases with sitting and lying. She has seen MD who prescribed medications which have helped decrease symptoms some but she continues with difficulty standing > 10-15 minutes.  PAIN:  Are you having pain? Yes: NPRS scale: 5/10 currently, 8-9/10 at worst Pain location: Lt hip and LE Pain description: pain Aggravating factors: standing Relieving factors: meds, sitting  PRECAUTIONS: None  RED FLAGS: None   WEIGHT BEARING RESTRICTIONS: No  FALLS:  Has patient fallen  in last 6 months? 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  DIAGNOSTIC FINDINGS:  Lumbar x ray: Mild to moderate degenerative joint changes of lower lumbar spine.   PATIENT SURVEYS:  Modified Oswestry 14/50   COGNITION: Overall cognitive status: Within functional limits for tasks assessed     SENSATION: WFL  MUSCLE LENGTH: Hamstrings: WFL Thomas test: negative bilat  POSTURE: anterior pelvic tilt  PALPATION: Increased mm spasticity Lt piriformis and glutes  LUMBAR ROM:   AROM eval  Flexion WFL  Extension WFL  Right lateral flexion WFL  Left lateral flexion WFL  Right rotation WFL  Left rotation WFL   (Blank rows = not tested)  LOWER EXTREMITY ROM:  grossly WFL    LOWER EXTREMITY MMT:    MMT Right eval Left eval  Hip flexion 4 4  Hip extension 4 4  Hip abduction 4+ 4-  Hip adduction    Hip internal rotation    Hip external rotation    Knee flexion 4 4  Knee extension    Ankle dorsiflexion    Ankle plantarflexion    Ankle inversion    Ankle eversion     (Blank rows = not tested)  LUMBAR SPECIAL TESTS:  Straight leg raise test: Negative and FABER test: Negative  [facility] Adult PT Treatment:                                                DATE: 12/29/23 Manual Therapy: R sidelying: Pin and stretch through all L deep hip rotators Neuromuscular re-ed: Instructed and performed home program: Supine, ball between knees, band around knees: Lower extremity internal rotation + exhale to lower extremity external rotation + inhale, 2 sets of 10  Hooklying: Active lateral costal expansion without inhale, 2 sets of 10  [facility] Adult PT Treatment:  DATE: 12/27/23  Manual Therapy: STM Lt lumbar  paraspinals PA mobs Lt lumbar transverse processes Neuromuscular re-ed: Seated sciatic nerve stretch - no change in symptoms Supine sciatic nerve stretch - no change in symptoms Oblique isometric 5 x 3 sec hold bilat Hip add squeeze with LAQ 2 x 10 Seated hip abd/ER green TB 1 LE at a time Alternating between standing and Seated on physioball Pelvic tilt Hip circles - some increased symptoms   [facility] Adult PT Treatment:                                                DATE: 12/22/2023 Therapeutic Exercise: Hooklying: Performed/instructed self-soft tissue mobilization for the L piriformis and self-unilateral lumbar mobilization using tennis ball Manual Therapy: R sidelying: Soft tissue mobilization to L piriformis region L unilateral PA glides at lumbar transverse processes, multiple segments Neuromuscular re-ed: Hooklying: Adductor squeeze, 3 x 10 sec Isometric hip flexion against therapist resistance, R and L 3 x 10 sec Cross connect (knee pressing into contralateral hand) + adductor squeeze, R and L, 3 x 10 sec Cross connect, R and L, 3 x 10  [facility] Adult PT Treatment:                                                DATE: 12/20/23 Therapeutic Exercise/Activity: Sidelying Bent knee hip abd 2 x 10 Leg lifts over and back x 10 Supine Bridge with ball squeeze 2 x 10 Clam green TB with Lt LE stabilizing 2 x 10 Standing Side step green TB Static stance on rocker board -- much increase in symptoms Squat on foam Hip hike 2'' step - no increased symptoms Manual Therapy: STM/TPR Lt glutes, piriformis Pin & stretch Lt piriformis   PATIENT EDUCATION:  Education details: PT POC and goals, HEP Person educated: Patient Education method: Explanation, Demonstration, and Handouts Education comprehension: verbalized understanding and returned demonstration  HOME EXERCISE PROGRAM: Access Code: [email or web address] URL: [email or web address] Date: 12/29/2023 Prepared by: Stephanie Stone  Exercises - Resistance Pulldown with March  - 1 x daily - 7 x weekly - 3 sets - 10 reps - Standing Anti-Rotation Press with Anchored Resistance  - 1 x daily - 7 x weekly - 2 sets - 10 reps - Standing Piriformis Release with Ball at Wall  - 1 x daily - 7 x weekly - 3 sets - 10 reps - Seated Table Piriformis Stretch  - 1 x daily - 7 x weekly - 1 sets - 3 reps - 20-30 seconds hold - Sidelying Hip Abduction  - 1 x daily - 7 x weekly - 3 sets - 10 reps - Supine Bridge  - 1 x daily - 7 x weekly - 3 sets - 10 reps - Clamshell with Resistance  - 1 x daily - 7 x weekly - 3 sets - 10 reps - Sidelying Bent Knee Lift at 45 Degrees  - 1 x daily - 7 x weekly - 3 sets - 10 reps - Sidelying Over and Back  - 1 x daily - 7 x weekly - 3 sets - 10 reps - Seated Figure 4 Piriformis Stretch  - 2 x daily - 7 x weekly - 1 sets - 5-10 reps -  10-15 sec hold - Squat on BOSU Ball  - 1 x daily - 7 x weekly - 3 sets - 10 reps - Oblique Crunch  - 1 x daily - 7 x weekly - 5 reps - 10 sec hold - Hooklying Lower Trunk Rotation with Small Ball at Piriformis or Spinal Muscles  - 1 x daily - 7 x weekly - 30-60 reps - Knee Roll Ins/Outs with Ball and Band  - 1 x daily - 7 x weekly - 3 sets - 10 reps - Supine Vacuum Breathing  - 1 x daily - 7 x weekly - 3 sets - 10 reps  ASSESSMENT:  CLINICAL IMPRESSION: Difficult to reproduce patient's chief complaint again today. She remains painful along the L lumbar soft tissue and articular structures today, but leg symptoms were primarily reproduced at the L deep hip rotator region. Focused manual treatments to this region today and followed up with home exercises to work on strength and relaxation of these muscles. Patient did have a less antalgic gait when leaving the clinic today. Patient will be following up with Dr. Curtis and per medical record, they will likely proceed with a lumbar MRI and plan for lumbar injection therapy to help control symptoms. Will coordinate based on  the results of this visit whether or not to continue with physical therapy in the interim.   GOALS: Goals reviewed with patient? Yes  SHORT TERM GOALS: Target date: 12/20/2023  Pt will be independent with initial HEP Baseline: Goal status: MET  2.  Pt will tolerate standing x 15 minutes with pain <= 2/10 Baseline:  Goal status: IN PROGRESS    LONG TERM GOALS: Target date: 01/17/2024  Pt will be independent with advanced HEP Baseline:  Goal status: INITIAL  2.  Pt will improve modified Oswestry to <= 4/50 to demo improved functional mobility Baseline:  Goal status: INITIAL  3.  Pt will improve bilat LE strength to 4+/5 to improve standing and walking tolerance Baseline:  Goal status: INITIAL  4.  Pt will tolerate standing x 30 minutes with pain <= 2/10 Baseline:  Goal status: INITIAL    PLAN:  PT FREQUENCY: 2x/week  PT DURATION: 8 weeks  PLANNED INTERVENTIONS: 97164- PT Re-evaluation, 97110-Therapeutic exercises, 97530- Therapeutic activity, 97112- Neuromuscular re-education, 97535- Self Care, [id number]- Manual therapy, [id number]- Aquatic Therapy, H9716- Electrical stimulation (unattended), [id number]- Traction (mechanical), [id number]- Ionotophoresis 4mg /ml Dexamethasone, Patient/Family education, Taping, Dry Needling, Cryotherapy, and Moist heat.  PLAN FOR NEXT SESSION:  Progress note next ; core and glute strength and stability, manual as indicated   Stephanie GORMAN Stone, PT 12/29/2023, 1:15 PM

## 2023-12-29 NOTE — Assessment & Plan Note (Signed)
 This very pleasant 73 year old female returns, she continues to have axial back pain rating down left posterior buttock, back of the leg to the bottom of the left foot. She has had physical therapy, max dose Celebrex , gabapentin  currently taken at 200 in the morning and 300 at night, continues to have discomfort. At this point we will proceed with MRI for interventional planning, likely left L5-S1 transforaminal epidural. She does have a big beach trip coming up with her entire family and will not be back until next week. I am going to do a burst of prednisone  to help ensure the strip is comfortable for her. She does prefer to follow-up with me to go over the MRI results before I order an epidural.

## 2023-12-31 ENCOUNTER — Ambulatory Visit

## 2023-12-31 DIAGNOSIS — M5416 Radiculopathy, lumbar region: Secondary | ICD-10-CM

## 2023-12-31 DIAGNOSIS — M4185 Other forms of scoliosis, thoracolumbar region: Secondary | ICD-10-CM | POA: Diagnosis not present

## 2023-12-31 DIAGNOSIS — M5117 Intervertebral disc disorders with radiculopathy, lumbosacral region: Secondary | ICD-10-CM | POA: Diagnosis not present

## 2023-12-31 DIAGNOSIS — M48061 Spinal stenosis, lumbar region without neurogenic claudication: Secondary | ICD-10-CM | POA: Diagnosis not present

## 2023-12-31 DIAGNOSIS — M5116 Intervertebral disc disorders with radiculopathy, lumbar region: Secondary | ICD-10-CM | POA: Diagnosis not present

## 2024-01-04 ENCOUNTER — Other Ambulatory Visit: Payer: Self-pay | Admitting: Family Medicine

## 2024-01-04 ENCOUNTER — Other Ambulatory Visit: Payer: Self-pay | Admitting: *Deleted

## 2024-01-04 MED ORDER — HYDROCHLOROTHIAZIDE 25 MG PO TABS: 25.0000 mg | ORAL_TABLET | Freq: Every day | ORAL | 1 refills | Status: AC

## 2024-01-10 ENCOUNTER — Telehealth: Payer: Self-pay

## 2024-01-10 ENCOUNTER — Ambulatory Visit: Payer: Self-pay | Admitting: Sports Medicine

## 2024-01-10 NOTE — Telephone Encounter (Signed)
 Spoke with radiology reading room and requested MRI be moved up for reading  Sent patient a Mychart message regarding this.

## 2024-01-10 NOTE — Telephone Encounter (Signed)
 Copied from CRM [id number]. Topic: Clinical - Lab/Test Results >> Jan 10, 2024  8:35 AM Kevelyn M wrote: Reason for CRM: Patient called in for results for MRI. Patient was made aware that test results have not been read yet.

## 2024-01-16 ENCOUNTER — Telehealth: Payer: Self-pay | Admitting: Family Medicine

## 2024-01-16 NOTE — Telephone Encounter (Signed)
 I was able to contact patient, Stephanie Stone, and advise of MRI results. Patient verbally stated comprehension and expressed the desire to move her appointment up closer to date. I have reached out to the front office and advised of her request. They will be contacting her shortly.

## 2024-01-16 NOTE — Telephone Encounter (Signed)
 Patient called.  She has not gotten a call as yet on MRI results.

## 2024-01-19 ENCOUNTER — Telehealth: Payer: Self-pay

## 2024-01-19 DIAGNOSIS — M5416 Radiculopathy, lumbar region: Secondary | ICD-10-CM

## 2024-01-19 NOTE — Telephone Encounter (Signed)
 I have returned patients call advising of results and recommendations @2 :35 pm. Separate encounter created regarding an update for Dr. Curtis.

## 2024-01-19 NOTE — Telephone Encounter (Signed)
 Copied from CRM [id number]. Topic: General - Call Back - No Documentation >> Jan 19, 2024  2:20 PM Rachelle R wrote: Reason for CRM: Patient returning call from [place]. Would like a call back.  Patient can be reached at [phone]

## 2024-01-19 NOTE — Telephone Encounter (Signed)
 I have advised patient of MRI results. Pt stated she would like to go forth with epidural referral but will be keeping follow-up appointment to review MRI results in further detail with Dr. ONEIDA.

## 2024-01-20 DIAGNOSIS — H2513 Age-related nuclear cataract, bilateral: Secondary | ICD-10-CM | POA: Diagnosis not present

## 2024-01-20 DIAGNOSIS — H35031 Hypertensive retinopathy, right eye: Secondary | ICD-10-CM | POA: Diagnosis not present

## 2024-01-20 DIAGNOSIS — H527 Unspecified disorder of refraction: Secondary | ICD-10-CM | POA: Diagnosis not present

## 2024-01-20 DIAGNOSIS — H04123 Dry eye syndrome of bilateral lacrimal glands: Secondary | ICD-10-CM | POA: Diagnosis not present

## 2024-01-20 NOTE — Addendum Note (Signed)
 Addended by: CURTIS DEBBY PARAS on: 01/20/2024 05:27 PM   Modules accepted: Orders

## 2024-01-20 NOTE — Telephone Encounter (Signed)
Epidural ordered.  

## 2024-01-24 ENCOUNTER — Ambulatory Visit (INDEPENDENT_AMBULATORY_CARE_PROVIDER_SITE_OTHER): Admitting: Sports Medicine

## 2024-01-24 DIAGNOSIS — M5416 Radiculopathy, lumbar region: Secondary | ICD-10-CM | POA: Diagnosis not present

## 2024-01-24 NOTE — Progress Notes (Signed)
    Procedures performed today:    None.  Independent interpretation of notes and tests performed by another provider:   MRI personally reviewed with the patient, multilevel disc protrusions, left L5 nerve does appear to come in contact with the left L5-S1 disc at a far lateral location.  Brief History, Exam, Impression, and Recommendations:    Left lumbar radiculitis Very pleasant 73 year old female, axial low back pain radiating to left posterior buttock, back of the leg to the bottom of the left foot but not quite to the toes, she has had physical therapy, max dose Celebrex , gabapentin  currently taken at 200 mg in the morning and 300 mg at night. Continues to have discomfort, MRI did show multilevel disc protrusions, we have set her up for a left L5-S1 transforaminal epidural. On my personal interpretation, the far lateral left L5 nerve root does appear to come in contact with the left L5-S1 disc. Her symptoms correspond to an L5 radiculitis, proceeding with left L5-S1 transforaminal epidural. Return to see me 6 weeks after the injection.  I spent 30 minutes of total time managing this patient today, this includes chart review, face to face, and non-face to face time.  ____________________________________________ Debby PARAS. Curtis, M.D., ABFM., CAQSM., AME. Primary Care and Sports Medicine  [facility]  Adjunct Professor of [facility] Medicine  [employer]  Restaurant manager, fast food

## 2024-01-24 NOTE — Assessment & Plan Note (Signed)
 Very pleasant 73 year old female, axial low back pain radiating to left posterior buttock, back of the leg to the bottom of the left foot but not quite to the toes, she has had physical therapy, max dose Celebrex , gabapentin  currently taken at 200 mg in the morning and 300 mg at night. Continues to have discomfort, MRI did show multilevel disc protrusions, we have set her up for a left L5-S1 transforaminal epidural. On my personal interpretation, the far lateral left L5 nerve root does appear to come in contact with the left L5-S1 disc. Her symptoms correspond to an L5 radiculitis, proceeding with left L5-S1 transforaminal epidural. Return to see me 6 weeks after the injection.

## 2024-01-27 NOTE — Discharge Instructions (Signed)

## 2024-01-30 ENCOUNTER — Ambulatory Visit
Admission: RE | Admit: 2024-01-30 | Discharge: 2024-01-30 | Disposition: A | Discharge status: Home / Self Care | Source: Ambulatory Visit | Attending: Sports Medicine | Admitting: Sports Medicine

## 2024-01-30 DIAGNOSIS — M4727 Other spondylosis with radiculopathy, lumbosacral region: Secondary | ICD-10-CM | POA: Diagnosis not present

## 2024-01-30 DIAGNOSIS — M5416 Radiculopathy, lumbar region: Secondary | ICD-10-CM

## 2024-01-30 MED ORDER — IOPAMIDOL (ISOVUE-M 200) INJECTION 41%
1.0000 mL | Freq: Once | INTRAMUSCULAR | Status: AC
  Administered 2024-01-30: 1 mL via EPIDURAL

## 2024-01-30 MED ORDER — METHYLPREDNISOLONE ACETATE 40 MG/ML INJ SUSP (RADIOLOG
80.0000 mg | Freq: Once | INTRAMUSCULAR | Status: AC
  Administered 2024-01-30: 80 mg via EPIDURAL

## 2024-03-05 ENCOUNTER — Encounter: Payer: Self-pay | Admitting: Family Medicine

## 2024-03-06 ENCOUNTER — Ambulatory Visit: Admitting: Sports Medicine

## 2024-03-13 ENCOUNTER — Encounter: Payer: Self-pay | Admitting: Sports Medicine

## 2024-03-13 DIAGNOSIS — M5416 Radiculopathy, lumbar region: Secondary | ICD-10-CM | POA: Diagnosis not present

## 2024-03-13 DIAGNOSIS — M67952 Unspecified disorder of synovium and tendon, left thigh: Secondary | ICD-10-CM | POA: Diagnosis not present

## 2024-03-13 DIAGNOSIS — M25552 Pain in left hip: Secondary | ICD-10-CM | POA: Diagnosis not present

## 2024-03-15 ENCOUNTER — Other Ambulatory Visit: Payer: Self-pay

## 2024-03-15 DIAGNOSIS — Z1211 Encounter for screening for malignant neoplasm of colon: Secondary | ICD-10-CM

## 2024-03-15 NOTE — Progress Notes (Signed)
 Cologuard ordered

## 2024-03-22 DIAGNOSIS — Z1211 Encounter for screening for malignant neoplasm of colon: Secondary | ICD-10-CM | POA: Diagnosis not present

## 2024-03-29 ENCOUNTER — Ambulatory Visit: Payer: Self-pay | Admitting: Family Medicine

## 2024-03-29 DIAGNOSIS — Z1211 Encounter for screening for malignant neoplasm of colon: Secondary | ICD-10-CM

## 2024-03-29 LAB — COLOGUARD: COLOGUARD: POSITIVE — AB

## 2024-03-29 NOTE — Progress Notes (Signed)
 Hi Stephanie Stone, just wanted to let you know that the Cologuard came back positive so we are continue to refer you for colonoscopy.  Do have a preference for provider or location?

## 2024-03-30 NOTE — Telephone Encounter (Signed)
 Per patient's chart - seen at Ortho on 03/13/24 for left hip pain.

## 2024-04-05 DIAGNOSIS — M5416 Radiculopathy, lumbar region: Secondary | ICD-10-CM | POA: Diagnosis not present

## 2024-04-12 ENCOUNTER — Ambulatory Visit (INDEPENDENT_AMBULATORY_CARE_PROVIDER_SITE_OTHER): Admitting: Family Medicine

## 2024-04-12 ENCOUNTER — Encounter: Payer: Self-pay | Admitting: Family Medicine

## 2024-04-12 ENCOUNTER — Ambulatory Visit: Admitting: Family Medicine

## 2024-04-12 VITALS — BP 135/80 | HR 90 | Ht 61.3 in | Wt 136.0 lb

## 2024-04-12 DIAGNOSIS — M8580 Other specified disorders of bone density and structure, unspecified site: Secondary | ICD-10-CM | POA: Diagnosis not present

## 2024-04-12 DIAGNOSIS — R7989 Other specified abnormal findings of blood chemistry: Secondary | ICD-10-CM | POA: Diagnosis not present

## 2024-04-12 DIAGNOSIS — Z13220 Encounter for screening for lipoid disorders: Secondary | ICD-10-CM | POA: Diagnosis not present

## 2024-04-12 DIAGNOSIS — Z0000 Encounter for general adult medical examination without abnormal findings: Secondary | ICD-10-CM

## 2024-04-12 DIAGNOSIS — I10 Essential (primary) hypertension: Secondary | ICD-10-CM

## 2024-04-12 DIAGNOSIS — R799 Abnormal finding of blood chemistry, unspecified: Secondary | ICD-10-CM | POA: Diagnosis not present

## 2024-04-12 DIAGNOSIS — G479 Sleep disorder, unspecified: Secondary | ICD-10-CM

## 2024-04-12 MED ORDER — TRAZODONE HCL 50 MG PO TABS: 25.0000 mg | ORAL_TABLET | Freq: Every evening | ORAL | 3 refills | Status: AC | PRN

## 2024-04-12 NOTE — Progress Notes (Addendum)
 Complete physical exam  Patient: Stephanie Stone   DOB: 02-10-1951   74 y.o. Female  MRN: [id number]  Subjective:    Chief Complaint  Patient presents with   Annual Exam    Stephanie Stone is a 73 y.o. female who presents today for a complete physical exam. She reports consuming a general diet. The patient does not participate in regular exercise at present. She generally feels well. She reports sleeping poorly. She does not have additional problems to discuss today.   Had injection in her back about a week ago. Has f/u in about 2 weeks.  She says that they did go down on level and it has made some improvement in her symptoms.  Her biggest issue right now she is really going with her sleep her bladder will oftentimes wake her up at night but then when she is awake she cannot go back to sleep she does not usually have difficulty falling asleep at least initially she has tried adjusting sleep time and making some changes without any significant improvement she has tried over-the-counter melatonin without relief.   Most recent fall risk assessment:    10/11/2023   10:42 AM  Fall Risk   Falls in the past year? 0  Number falls in past yr: 0  Injury with Fall? 0  Risk for fall due to : No Fall Risks  Follow up Falls evaluation completed     Most recent depression screenings:    04/12/2024    1:11 PM 10/11/2023   10:42 AM  PHQ 2/9 Scores  PHQ - 2 Score 0 0         Patient Care Team: Alvan Dorothyann BIRCH, MD as PCP - General (Family Medicine) Beverli Cara PARAS, [facility] (Inactive) as Pharmacist (Pharmacist)   Outpatient Medications Prior to Visit  Medication Sig   cholecalciferol (VITAMIN D3) 25 MCG (1000 UNIT) tablet Take 1,000 Units by mouth daily. (Patient taking differently: Take 1,000 Units by mouth daily. 50 mcg daily)   fluticasone  (FLONASE ) 50 MCG/ACT nasal spray SHAKE LIQUID AND USE 2 SPRAYS IN EACH NOSTRIL DAILY   gabapentin  (NEURONTIN ) 300 MG capsule One tab PO qHS for a week, then  BID for a week, then TID. May double weekly to a max of 3,600mg /day   hydrochlorothiazide  (HYDRODIURIL ) 25 MG tablet Take 1 tablet (25 mg total) by mouth daily.   sodium chloride HYPERTONIC 3 % nebulizer solution Take 4 mLs by nebulization as needed.   Zinc 22.5 MG TABS Take by mouth daily.   [DISCONTINUED] aspirin 81 MG EC tablet Take 81 mg by mouth daily. (Patient not taking: Reported on 11/17/2023)   [DISCONTINUED] celecoxib  (CELEBREX ) 200 MG capsule One to 2 tablets by mouth daily as needed for pain. (Patient not taking: Reported on 04/12/2024)   [DISCONTINUED] Estradiol  10 MCG TABS vaginal tablet Place 1 tablet (10 mcg total) vaginally 2 (two) times a week. Place 1 tab nightly for two weeks then twice a week after (Patient not taking: Reported on 11/17/2023)   [DISCONTINUED] fluocinonide (LIDEX) 0.05 % external solution Apply 1 application topically at bedtime. (Patient not taking: Reported on 04/12/2024)   [DISCONTINUED] HYDROcodone -acetaminophen  (NORCO/VICODIN) 5-325 MG tablet Take 1 tablet by mouth every 8 (eight) hours as needed for moderate pain (pain score 4-6).   [DISCONTINUED] MINOXIDIL , TOPICAL, 5 % SOLN Apply 1 application topically at bedtime. (Patient not taking: Reported on 04/12/2024)   [DISCONTINUED] predniSONE  (DELTASONE ) 50 MG tablet One tab PO daily for 5 days.   [  DISCONTINUED] rosuvastatin  (CRESTOR ) 5 MG tablet Take 1 tablet (5 mg total) by mouth at bedtime as needed.   [DISCONTINUED] vitamin E 200 UNIT capsule Take 200 Units by mouth daily. (Patient not taking: Reported on 04/12/2024)   No facility-administered medications prior to visit.    ROS        Objective:     BP 135/80 (BP Location: Right Arm, Patient Position: Sitting)   Pulse 90   Ht 5' 1.3 (1.557 m)   Wt 136 lb (61.7 kg)   SpO2 99%   BMI 25.45 kg/m     Physical Exam   No results found for any visits on 04/12/24.      Assessment & Plan:    Routine Health Maintenance and Physical  Exam  Immunization History  Administered Date(s) Administered   Fluad Quad(high Dose 65+) 05/07/2019, 05/07/2020, 05/19/2021, 04/22/2022   Fluad Trivalent(High Dose 65+) 04/12/2023   INFLUENZA, HIGH DOSE SEASONAL PF 05/02/2017   Influenza,inj,Quad PF,6+ Mos 04/20/2013, 05/27/2016, 05/17/2018   Influenza-Unspecified 04/03/2014   PFIZER Comirnaty(Gray Top)Covid-19 Tri-Sucrose Vaccine 10/29/2020   PFIZER(Purple Top)SARS-COV-2 Vaccination 08/17/2019, 09/10/2019   PNEUMOCOCCAL CONJUGATE-20 01/20/2022   Pneumococcal Conjugate-13 05/02/2017   Pneumococcal Polysaccharide-23 05/17/2018   Td 07/13/2003   Tdap 12/12/2015    Health Maintenance  Topic Date Due   Zoster Vaccines- Shingrix (1 of 2) Never done   Influenza Vaccine  02/10/2024   COVID-19 Vaccine (4 - 2025-26 season) 03/12/2024   Medicare Annual Wellness (AWV)  06/19/2024   Mammogram  08/07/2025   DTaP/Tdap/Td (3 - Td or Tdap) 12/11/2025   Fecal DNA (Cologuard)  03/23/2027   Pneumococcal Vaccine: 50+ Years  Completed   DEXA SCAN  Completed   Hepatitis C Screening  Completed   HPV VACCINES  Aged Out   Meningococcal B Vaccine  Aged Out    Discussed health benefits of physical activity, and encouraged her to engage in regular exercise appropriate for her age and condition.  Problem List Items Addressed This Visit       Cardiovascular and Mediastinum   Essential hypertension   Relevant Orders   Lipid Panel With LDL/HDL Ratio   CMP14+EGFR   CBC with Differential/Platelet   TSH     Musculoskeletal and Integument   Osteopenia   Relevant Orders   CMP14+EGFR   CBC with Differential/Platelet   TSH     Other   Sleep disturbance   Discussed a trial of trazodone.  Warned about potential side effects of medication.  If not helping or not liking how she is feeling on it then stop and let us  know.  Continue to work on good sleep hygiene and we did review that today as well.      Relevant Medications   traZODone (DESYREL) 50  MG tablet   Other Visit Diagnoses       Wellness examination    -  Primary     Low vitamin D  level         Screening for lipid disorders       Relevant Orders   Lipid Panel With LDL/HDL Ratio   CMP14+EGFR   CBC with Differential/Platelet     Abnormal TSH       Relevant Orders   TSH     Abnormal blood chemistry           She plans to schedule her flu shot maybe another week they did not want her to have any type of vaccines right after having had the injection  since it did have a steroid in it.  Reminded her to schedule the shingles vaccine at the pharmacy. Will get up-to-date labs today. Increase activity as recommended by the orthopedist.    Return in about 1 year (around 04/12/2025) for Wellness Exam.     Dorothyann Byars, MD

## 2024-04-12 NOTE — Assessment & Plan Note (Signed)
 Discussed a trial of trazodone.  Warned about potential side effects of medication.  If not helping or not liking how she is feeling on it then stop and let us  know.  Continue to work on good sleep hygiene and we did review that today as well.

## 2024-04-13 ENCOUNTER — Ambulatory Visit: Payer: Self-pay | Admitting: Family Medicine

## 2024-04-13 DIAGNOSIS — R110 Nausea: Secondary | ICD-10-CM | POA: Diagnosis not present

## 2024-04-13 DIAGNOSIS — R300 Dysuria: Secondary | ICD-10-CM | POA: Diagnosis not present

## 2024-04-13 DIAGNOSIS — N390 Urinary tract infection, site not specified: Secondary | ICD-10-CM | POA: Diagnosis not present

## 2024-04-13 DIAGNOSIS — B3731 Acute candidiasis of vulva and vagina: Secondary | ICD-10-CM | POA: Diagnosis not present

## 2024-04-13 DIAGNOSIS — E7800 Pure hypercholesterolemia, unspecified: Secondary | ICD-10-CM

## 2024-04-13 LAB — CBC WITH DIFFERENTIAL/PLATELET
Basophils Absolute: 0.1 x10E3/uL (ref 0.0–0.2)
Basos: 1 %
EOS (ABSOLUTE): 0.2 x10E3/uL (ref 0.0–0.4)
Eos: 2 %
Hematocrit: 48.8 % — ABNORMAL HIGH (ref 34.0–46.6)
Hemoglobin: 15.9 g/dL (ref 11.1–15.9)
Immature Grans (Abs): 0.1 x10E3/uL (ref 0.0–0.1)
Immature Granulocytes: 1 %
Lymphocytes Absolute: 1.9 x10E3/uL (ref 0.7–3.1)
Lymphs: 21 %
MCH: 31.4 pg (ref 26.6–33.0)
MCHC: 32.6 g/dL (ref 31.5–35.7)
MCV: 96 fL (ref 79–97)
Monocytes Absolute: 0.8 x10E3/uL (ref 0.1–0.9)
Monocytes: 8 %
Neutrophils Absolute: 6.2 x10E3/uL (ref 1.4–7.0)
Neutrophils: 67 %
Platelets: 274 x10E3/uL (ref 150–450)
RBC: 5.06 x10E6/uL (ref 3.77–5.28)
RDW: 13.8 % (ref 11.7–15.4)
WBC: 9.1 x10E3/uL (ref 3.4–10.8)

## 2024-04-13 LAB — CMP14+EGFR
ALT: 20 IU/L (ref 0–32)
AST: 20 IU/L (ref 0–40)
Albumin: 4.7 g/dL (ref 3.8–4.8)
Alkaline Phosphatase: 99 IU/L (ref 49–135)
BUN/Creatinine Ratio: 26 (ref 12–28)
BUN: 25 mg/dL (ref 8–27)
Bilirubin Total: 0.5 mg/dL (ref 0.0–1.2)
CO2: 23 mmol/L (ref 20–29)
Calcium: 10.2 mg/dL (ref 8.7–10.3)
Chloride: 97 mmol/L (ref 96–106)
Creatinine, Ser: 0.97 mg/dL (ref 0.57–1.00)
Globulin, Total: 2.3 g/dL (ref 1.5–4.5)
Glucose: 89 mg/dL (ref 70–99)
Potassium: 3.8 mmol/L (ref 3.5–5.2)
Sodium: 138 mmol/L (ref 134–144)
Total Protein: 7 g/dL (ref 6.0–8.5)
eGFR: 62 mL/min/1.73 (ref >59)

## 2024-04-13 LAB — LIPID PANEL WITH LDL/HDL RATIO
Cholesterol, Total: 242 mg/dL — ABNORMAL HIGH (ref 100–199)
HDL: 63 mg/dL (ref >39)
LDL Chol Calc (NIH): 153 mg/dL — ABNORMAL HIGH (ref 0–99)
LDL/HDL Ratio: 2.4 ratio (ref 0.0–3.2)
Triglycerides: 147 mg/dL (ref 0–149)
VLDL Cholesterol Cal: 26 mg/dL (ref 5–40)

## 2024-04-13 LAB — TSH: TSH: 4.65 u[IU]/mL — ABNORMAL HIGH (ref 0.450–4.500)

## 2024-04-13 MED ORDER — PITAVASTATIN CALCIUM 1 MG PO TABS: 1.0000 mg | ORAL_TABLET | Freq: Every day | ORAL | 5 refills | Status: DC

## 2024-04-13 NOTE — Telephone Encounter (Signed)
 Meds ordered this encounter  Medications   Pitavastatin Calcium  1 MG TABS    Sig: Take 1 tablet (1 mg total) by mouth at bedtime.    Dispense:  30 tablet    Refill:  5

## 2024-04-13 NOTE — Progress Notes (Signed)
 Hi Chelsye, total cholesterol and LDL jumped up significantly compared to last year.  You would definitely benefit from a statin as your 10-year cardiovascular risk is around 17%.  Anything 10% or higher we do recommend a cholesterol-lowering medication.  I know you have tried 1 in the past.  Would you be open to trying a different one? .  Blood count is stable.  Thyroid  level at 4.6.  Slightly elevated it went up to about the same level about 3 years ago and then went back down so I do want a keep an eye on this and plan to check again in 6 months.  Kidney function looks better this time you also look a little better hydrated which is good.  The 10-year ASCVD risk score (Arnett DK, et al., 2019) is: 17.5%   Values used to calculate the score:     Age: 73 years     Clincally relevant sex: Female     Is Non-Hispanic African American: No     Diabetic: No     Tobacco smoker: No     Systolic Blood Pressure: 135 mmHg     Is BP treated: Yes     HDL Cholesterol: 63 mg/dL     Total Cholesterol: 242 mg/dL

## 2024-04-18 DIAGNOSIS — M5416 Radiculopathy, lumbar region: Secondary | ICD-10-CM | POA: Diagnosis not present

## 2024-04-18 DIAGNOSIS — R202 Paresthesia of skin: Secondary | ICD-10-CM | POA: Diagnosis not present

## 2024-04-19 DIAGNOSIS — Z1211 Encounter for screening for malignant neoplasm of colon: Secondary | ICD-10-CM | POA: Diagnosis not present

## 2024-04-19 DIAGNOSIS — K635 Polyp of colon: Secondary | ICD-10-CM | POA: Diagnosis not present

## 2024-04-19 DIAGNOSIS — D123 Benign neoplasm of transverse colon: Secondary | ICD-10-CM | POA: Diagnosis not present

## 2024-04-19 DIAGNOSIS — R195 Other fecal abnormalities: Secondary | ICD-10-CM | POA: Diagnosis not present

## 2024-04-19 DIAGNOSIS — K5730 Diverticulosis of large intestine without perforation or abscess without bleeding: Secondary | ICD-10-CM | POA: Diagnosis not present

## 2024-05-01 DIAGNOSIS — M25552 Pain in left hip: Secondary | ICD-10-CM | POA: Diagnosis not present

## 2024-05-04 ENCOUNTER — Other Ambulatory Visit: Payer: Self-pay

## 2024-05-04 ENCOUNTER — Ambulatory Visit
Admission: EM | Admit: 2024-05-04 | Discharge: 2024-05-04 | Disposition: A | Discharge status: Home / Self Care | Attending: Family Medicine | Admitting: Family Medicine

## 2024-05-04 DIAGNOSIS — R350 Frequency of micturition: Secondary | ICD-10-CM | POA: Insufficient documentation

## 2024-05-04 LAB — POCT URINALYSIS DIP (MANUAL ENTRY)
Bilirubin, UA: NEGATIVE
Blood, UA: NEGATIVE
Glucose, UA: NEGATIVE mg/dL
Ketones, POC UA: NEGATIVE mg/dL
Leukocytes, UA: NEGATIVE
Nitrite, UA: NEGATIVE
Protein Ur, POC: NEGATIVE mg/dL
Spec Grav, UA: 1.025 (ref 1.010–1.025)
Urobilinogen, UA: 0.2 U/dL
pH, UA: 5.5 (ref 5.0–8.0)

## 2024-05-04 NOTE — Discharge Instructions (Signed)
 Increase fluid intake. If symptoms become significantly worse during the night or over the weekend, proceed to the local emergency room.

## 2024-05-04 NOTE — ED Triage Notes (Signed)
 Reports hx dropped bladder. Just got off abx a week ago, was on cefaclor for 10 days. Has discomfort in bladder, urinating small amounts. No fever.

## 2024-05-04 NOTE — ED Provider Notes (Signed)
 [facility]    CSN: [id number] Arrival date & time: 05/04/24  1308      History   Chief Complaint Chief Complaint  Patient presents with   Urinary Frequency    HPI Stephanie Stone is a 73 y.o. female.   Patient complains of onset last night of recurrent urinary frequency and hesitancy without dysuria. She reports a history of prolapsed bladder and recurring UTI's having finished a course of cefdinir  10 days ago for UTI.  She states that she has difficulty determining when she is actually developing a UTI.  She denies fever, abdominal/pelvic pain, and flank pain..  The history is provided by the patient.    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   SUI (stress urinary incontinence, female) 09/22/2023   Overactive bladder 09/22/2023   Encounter for screening mammogram for malignant neoplasm of breast 06/20/2023   Osteopenia 06/20/2023   Intertrigo 12/23/2022   Bronchiectasis (HCC) 12/30/2021   Sleep disturbance 05/19/2021   Degenerative disc disease, cervical 08/07/2019   Occipital neuralgia of right side 08/07/2019   Slow transit constipation 10/01/2014   Hyperlipidemia 08/11/2010   DEPRESSION, SITUATIONAL 08/11/2010   Essential hypertension 07/08/2010   ALOPECIA AREATA 05/29/2010   MENOPAUSE, PREMATURE 02/17/2007   Migraine headache 02/17/2007   MVP (mitral valve prolapse) 02/17/2007    Past Surgical History:  Procedure Laterality Date   ABDOMINAL HYSTERECTOMY  07/12/1977   endometriosis   APPENDECTOMY       OB History     Gravida  2   Para  2   Term  2   Preterm      AB      Living  2      SAB      IAB      Ectopic      Multiple      Live Births               Home Medications    Prior to Admission medications   Medication Sig Start Date End Date Taking? Authorizing Provider  celecoxib  (CELEBREX ) 100 MG capsule Take 100 mg by mouth 2 (two) times daily.   Yes [provider]  cholecalciferol (VITAMIN D3) 25 MCG (1000 UNIT) tablet Take 1,000 Units by mouth daily. Patient taking differently: Take 1,000 Units by mouth daily. 50 mcg daily    [provider]  fluticasone  (FLONASE ) 50 MCG/ACT nasal spray SHAKE LIQUID AND USE 2 SPRAYS IN EACH NOSTRIL DAILY 01/04/24   Alvan Dorothyann BIRCH, MD  gabapentin  (NEURONTIN ) 300 MG capsule One tab PO qHS for a week, then BID for a week, then TID. May double weekly to a max of 3,600mg /day 11/17/23   Curtis Debby PARAS, MD  hydrochlorothiazide  (HYDRODIURIL ) 25 MG tablet Take 1 tablet (25 mg total) by mouth daily. 01/04/24   Alvan Dorothyann BIRCH, MD  Pitavastatin Calcium  1 MG TABS Take 1 tablet (1 mg total) by mouth at bedtime. 04/13/24  Alvan Dorothyann BIRCH, MD  sodium chloride HYPERTONIC 3 % nebulizer solution Take 4 mLs by nebulization as needed. 01/11/23   [provider]  traZODone (DESYREL) 50 MG tablet Take 0.5-2 tablets (25-100 mg total) by mouth at bedtime as needed for sleep. 04/12/24   Alvan Dorothyann BIRCH, MD  Zinc 22.5 MG TABS Take by mouth daily.    [provider]    Family History Family History  Problem Relation Age of Onset   Heart attack Mother    Hypertension Mother    Coronary artery disease Mother 78   Liver cancer Mother    Cancer Mother    Heart disease Mother    Heart attack Father    Early death Father    Heart disease Father    Heart attack Brother 33   Stroke Brother    Cancer Brother    COPD Brother    Heart disease Brother    Hypertension Brother     Heart disease Brother    Cancer Brother    COPD Brother    Hypertension Brother     Social History Social History   Tobacco Use   Smoking status: Never   Smokeless tobacco: Never  Vaping Use   Vaping status: Never Used  Substance Use Topics   Alcohol use: Not Currently    Comment: maybe once a month   Drug use: No     Allergies   Sulfa antibiotics, Azithromycin , Codeine, Elemental sulfur, Lovastatin, and Miconazole nitrate   Review of Systems Review of Systems  Constitutional:  Negative for activity change, appetite change, chills, diaphoresis, fatigue and fever.  Gastrointestinal:  Negative for abdominal pain, nausea and vomiting.  Genitourinary:  Positive for frequency and urgency. Negative for dysuria, flank pain, hematuria and pelvic pain.  Musculoskeletal:  Negative for back pain.  All other systems reviewed and are negative.    Physical Exam Triage Vital Signs ED Triage Vitals  Encounter Vitals Group     BP 05/04/24 1416 (!) 143/92     Girls Systolic BP Percentile --      Girls Diastolic BP Percentile --      Boys Systolic BP Percentile --      Boys Diastolic BP Percentile --      Pulse Rate 05/04/24 1416 85     Resp 05/04/24 1416 16     Temp 05/04/24 1416 98.1 F (36.7 C)     Temp src --      SpO2 05/04/24 1416 96 %     Weight --      Height --      Head Circumference --      Peak Flow --      Pain Score 05/04/24 1420 0     Pain Loc --      Pain Education --      Exclude from Growth Chart --    No data found.  Updated Vital Signs BP (!) 143/92   Pulse 85   Temp 98.1 F (36.7 C)   Resp 16   SpO2 96%   Visual Acuity Right Eye Distance:   Left Eye Distance:   Bilateral Distance:    Right Eye Near:   Left Eye Near:    Bilateral Near:     Physical Exam Nursing notes and Vital Signs reviewed. Appearance:  Patient appears stated age, and in no acute distress.    Eyes:  Pupils are equal, round, and reactive to light and accomodation.   Extraocular movement  is intact.  Conjunctivae are not inflamed   Pharynx:  Normal; moist mucous membranes  Neck:  Supple.  No adenopathy Lungs:  Clear to auscultation.  Breath sounds are equal.  Moving air well. Heart:  Regular rate and rhythm without murmurs, rubs, or gallops.  Abdomen:  Nontender without masses or hepatosplenomegaly.  Bowel sounds are present.  No CVA or flank tenderness.  Extremities:  No edema.  Skin:  No rash present.     UC Treatments / Results  Labs (all labs ordered are listed, but only abnormal results are displayed) Labs Reviewed  URINE CULTURE  POCT URINALYSIS DIP (MANUAL ENTRY) negative    EKG   Radiology No results found.  Procedures Procedures (including critical care time)  Medications Ordered in UC Medications - No data to display  Initial Impression / Assessment and Plan / UC Course  I have reviewed the triage vital signs and the nursing notes.  Pertinent labs & imaging results that were available during my care of the patient were reviewed by me and considered in my medical decision making (see chart for details).    Urinalysis normal. Urine culture pending; begin antibiotic if positive. Followup with Family Doctor if not improved in about 5 days.  Final Clinical Impressions(s) / UC Diagnoses   Final diagnoses:  Urinary frequency     Discharge Instructions      Increase fluid intake.  If symptoms become significantly worse during the night or over the weekend, proceed to the local emergency room.     ED Prescriptions   None       Pauline Garnette LABOR, MD 05/05/24 1437

## 2024-05-05 LAB — URINE CULTURE
Culture: NO GROWTH
Special Requests: NORMAL

## 2024-05-06 ENCOUNTER — Other Ambulatory Visit: Payer: Self-pay | Admitting: Obstetrics and Gynecology

## 2024-05-09 NOTE — Telephone Encounter (Signed)
 Gemtesa  not on patient's current med list.   Spoke with patient and she states she was given samples in 2024 by Kaitlin. She never took them. She decided to try them the other week, and feels she has improved. She would like to have this rx'd. She is also aware she needs to schedule follow up visit, advised someone in scheduling will contact her.  Please advise on Gemtesa  rx. Thank you!

## 2024-05-10 ENCOUNTER — Telehealth: Payer: Self-pay

## 2024-05-10 ENCOUNTER — Other Ambulatory Visit (HOSPITAL_COMMUNITY): Payer: Self-pay

## 2024-05-10 NOTE — Telephone Encounter (Signed)
 Pharmacy Patient Advocate Encounter   Received notification from Physician's Office that prior authorization for Pitavastatin 1mg  tabs is required/requested.   Insurance verification completed.   The patient is insured through [employer] FEP.   Per test claim: PA required; PA submitted to above mentioned insurance via Fax Key/confirmation #/EOC - Status is pending   Fax# [phone] Phone# [phone]

## 2024-05-11 ENCOUNTER — Other Ambulatory Visit (HOSPITAL_COMMUNITY): Payer: Self-pay

## 2024-05-11 NOTE — Telephone Encounter (Signed)
 Pharmacy Patient Advocate Encounter  Received notification from [facility] FEP that Prior Authorization for Pitavastatin 1mg  tabs has been APPROVED from 02/11/24 to 05/11/25   Approval letter indexed to media tab

## 2024-05-16 ENCOUNTER — Other Ambulatory Visit: Payer: Self-pay | Admitting: *Deleted

## 2024-05-16 DIAGNOSIS — E7800 Pure hypercholesterolemia, unspecified: Secondary | ICD-10-CM

## 2024-05-16 DIAGNOSIS — M25552 Pain in left hip: Secondary | ICD-10-CM | POA: Diagnosis not present

## 2024-05-16 DIAGNOSIS — G5702 Lesion of sciatic nerve, left lower limb: Secondary | ICD-10-CM | POA: Diagnosis not present

## 2024-05-16 MED ORDER — PITAVASTATIN CALCIUM 1 MG PO TABS: 1.0000 mg | ORAL_TABLET | Freq: Every day | ORAL | 11 refills | Status: AC

## 2024-05-23 ENCOUNTER — Encounter: Payer: Self-pay | Admitting: Obstetrics and Gynecology

## 2024-05-23 ENCOUNTER — Ambulatory Visit (INDEPENDENT_AMBULATORY_CARE_PROVIDER_SITE_OTHER): Admitting: Obstetrics and Gynecology

## 2024-05-23 VITALS — BP 133/87 | HR 77

## 2024-05-23 DIAGNOSIS — N3281 Overactive bladder: Secondary | ICD-10-CM

## 2024-05-23 DIAGNOSIS — N952 Postmenopausal atrophic vaginitis: Secondary | ICD-10-CM | POA: Diagnosis not present

## 2024-05-23 DIAGNOSIS — N393 Stress incontinence (female) (male): Secondary | ICD-10-CM | POA: Diagnosis not present

## 2024-05-23 DIAGNOSIS — Z87440 Personal history of urinary (tract) infections: Secondary | ICD-10-CM

## 2024-05-23 DIAGNOSIS — N390 Urinary tract infection, site not specified: Secondary | ICD-10-CM | POA: Insufficient documentation

## 2024-05-23 DIAGNOSIS — N993 Prolapse of vaginal vault after hysterectomy: Secondary | ICD-10-CM

## 2024-05-23 MED ORDER — TROSPIUM CHLORIDE ER 60 MG PO CP24: 1.0000 | ORAL_CAPSULE | Freq: Every day | ORAL | 5 refills | Status: AC

## 2024-05-23 MED ORDER — ESTRADIOL 0.01 % VA CREA: TOPICAL_CREAM | VAGINAL | 11 refills | Status: AC

## 2024-05-23 NOTE — Progress Notes (Signed)
 [facility] Return Visit  SUBJECTIVE  History of Present Illness: Stephanie Stone is a 73 y.o. female seen in follow-up for prolapse and mixed incontinence.   Had previously been interested in surgery but was dealing with some other potential cardiac concerns. She had heart monitoring and reports it was all normal. Has done some PT recently fort her back/ hip pain.   She started the Gemtesa  about 2 weeks ago. She took the samples but the cost of the prescription was very expensive. She does feel it helps with urinary frequency.   She is not using vaginal estrogen because it caused some burning. Has not tried it again. She reports several UTIs but when she was out of town and went to urgent care.   She reports she is interested in surgery but also states that her prolapse is not that bothersome to her. She is more concerned about her urinary frequency and UTIs.   Prior CMG showed normal sensation, and within normal limits cystometric capacity. Findings positive for stress incontinence, positive for detrusor overactivity.   Past Medical History: Patient  has a past medical history of Allergy, Alopecia, Anxiety, Depression, Encounter for Medicare annual wellness exam (06/20/2023), Hyperlipidemia, Hypertension, Lung infiltrate (07/12/2008), Menopause, premature, Migraines, MVP (mitral valve prolapse), and Protrusion of lumbar intervertebral disc.   Past Surgical History: She  has a past surgical history that includes Appendectomy and Abdominal hysterectomy (07/12/1977).   Medications: She has a current medication list which includes the following prescription(s): celecoxib , cholecalciferol, [START ON 05/24/2024] estradiol , trospium chloride, fluticasone , hydrochlorothiazide , pitavastatin calcium , sodium chloride hypertonic, trazodone, and zinc.   Allergies: Patient is allergic to sulfa antibiotics, azithromycin , codeine, elemental sulfur, lovastatin, and miconazole nitrate.   Social  History: Patient  reports that she has never smoked. She has never used smokeless tobacco. She reports that she does not currently use alcohol. She reports that she does not use drugs.     OBJECTIVE     Physical Exam: Vitals:   05/23/24 1002  BP: 133/87  Pulse: 77    Gen: No apparent distress, A&O x 3.  Detailed Urogynecologic Evaluation:  Normal external genitalia. On speculum, normal vaginal mucosa. On bimanual, no masses present.  POP-Q  0                                            Aa   0                                           Ba  -6                                              C   3                                            Gh  4  Pb  7                                            tvl   -2                                            Ap  -2                                            Bp                                                 D   Results for orders placed or performed during the hospital encounter of 05/04/24  POCT urinalysis dipstick   Collection Time: 05/04/24  2:21 PM  Result Value Ref Range   Color, UA yellow yellow   Clarity, UA clear clear   Glucose, UA negative negative mg/dL   Bilirubin, UA negative negative   Ketones, POC UA negative negative mg/dL   Spec Grav, UA 8.974 8.989 - 1.025   Blood, UA negative negative   pH, UA 5.5 5.0 - 8.0   Protein Ur, POC negative negative mg/dL   Urobilinogen, UA 0.2 0.2 or 1.0 E.U./dL   Nitrite, UA Negative Negative   Leukocytes, UA Negative Negative  Urine Culture   Collection Time: 05/04/24  3:37 PM   Specimen: Urine, Clean Catch  Result Value Ref Range   Specimen Description URINE, CLEAN CATCH    Special Requests Normal    Culture      NO GROWTH Performed at [facility], [address], [place], [place] [address]    Report Status 05/05/2024 FINAL       ASSESSMENT AND PLAN    Stephanie Stone is a 22 y.o. with:  1. Overactive bladder   2. Recurrent  urinary tract infection   3. 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 SUI (stress urinary incontinence, female)      Overactive bladder Assessment & Plan: -We discussed that prolapse repair will only fix her bulge and will not necessarily change the bladder urgency/ spasm symptoms.  - Prescribed trospium 60mg  ER. Reviewed possible side effects.    Orders: -     Trospium Chloride ER; Take 1 capsule (60 mg total) by mouth daily.  Dispense: 30 capsule; Refill: 5  Recurrent urinary tract infection Assessment & Plan: - Only one documented positive culture but others treated in urgent care.  - Recommended starting vaginal estrogen cream twice a week for prevention   Vaginal atrophy -     Estradiol ; Place 0.5g nightly for two weeks then twice a week after  Dispense: 42.5 g; Refill: 11  Vaginal vault prolapse after hysterectomy Assessment & Plan: - She is not that bothered by her prolapse. We discussed that it is not guaranteed to get worse over time so surgery should only be done if she is having issues with bladder emptying or if she is bothered.  -  We discussed the option of a pessary since it will give her an idea of how it would feel after a surgery. We can reassess at next visit .   SUI (stress urinary incontinence, female) Assessment & Plan: - Reviewed options of pessary, urethral bulking or sling. She would be more interested in urethral bulking but prefers to treat OAB symptoms first.     Return 6 weeks   Rosaline LOISE Caper, MD

## 2024-05-23 NOTE — Assessment & Plan Note (Signed)
-  We discussed that prolapse repair will only fix her bulge and will not necessarily change the bladder urgency/ spasm symptoms.  - Prescribed trospium 60mg  ER. Reviewed possible side effects.

## 2024-05-23 NOTE — Assessment & Plan Note (Signed)
-   Reviewed options of pessary, urethral bulking or sling. She would be more interested in urethral bulking but prefers to treat OAB symptoms first.

## 2024-05-23 NOTE — Assessment & Plan Note (Signed)
-   Only one documented positive culture but others treated in urgent care.  - Recommended starting vaginal estrogen cream twice a week for prevention

## 2024-05-23 NOTE — Patient Instructions (Signed)
Start vaginal estrogen therapy nightly for two weeks then 2 times weekly at night for treatment of vaginal atrophy (dryness of the vaginal tissues).  Please let us know if the prescription is too expensive and we can look for alternative options.   

## 2024-05-23 NOTE — Assessment & Plan Note (Signed)
-   She is not that bothered by her prolapse. We discussed that it is not guaranteed to get worse over time so surgery should only be done if she is having issues with bladder emptying or if she is bothered.  - We discussed the option of a pessary since it will give her an idea of how it would feel after a surgery. We can reassess at next visit .

## 2024-05-28 DIAGNOSIS — Z23 Encounter for immunization: Secondary | ICD-10-CM | POA: Diagnosis not present

## 2024-05-28 DIAGNOSIS — R0609 Other forms of dyspnea: Secondary | ICD-10-CM | POA: Diagnosis not present

## 2024-05-28 DIAGNOSIS — J988 Other specified respiratory disorders: Secondary | ICD-10-CM | POA: Diagnosis not present

## 2024-05-28 DIAGNOSIS — J479 Bronchiectasis, uncomplicated: Secondary | ICD-10-CM | POA: Diagnosis not present

## 2024-06-13 ENCOUNTER — Ambulatory Visit

## 2024-06-14 ENCOUNTER — Ambulatory Visit

## 2024-07-02 ENCOUNTER — Ambulatory Visit: Admitting: Obstetrics and Gynecology

## 2024-07-17 ENCOUNTER — Ambulatory Visit

## 2024-07-18 ENCOUNTER — Ambulatory Visit

## 2024-07-23 ENCOUNTER — Encounter: Payer: Self-pay | Admitting: *Deleted

## 2024-07-31 ENCOUNTER — Ambulatory Visit (INDEPENDENT_AMBULATORY_CARE_PROVIDER_SITE_OTHER)

## 2024-07-31 VITALS — BP 116/75 | HR 74 | Ht 61.0 in | Wt 139.0 lb

## 2024-07-31 DIAGNOSIS — Z0000 Encounter for general adult medical examination without abnormal findings: Secondary | ICD-10-CM | POA: Diagnosis not present

## 2024-07-31 NOTE — Progress Notes (Signed)
 "  Chief Complaint  Patient presents with   Medicare Wellness     Subjective:   Stephanie Stone is a 74 y.o. female who presents for a Medicare Annual Wellness Visit.  Visit info / Clinical Intake: Medicare Wellness Visit Type:: Subsequent Annual Wellness Visit Persons participating in visit and providing information:: patient Medicare Wellness Visit Mode:: In-person (required for WTM) Interpreter Needed?: No Pre-visit prep was completed: yes AWV questionnaire completed by patient prior to visit?: yes Date:: 07/17/24 Living arrangements:: lives with spouse/significant other Patient's Overall Health Status Rating: good Typical amount of pain: some Does pain affect daily life?: (!) yes Are you currently prescribed opioids?: no  Dietary Habits and Nutritional Risks How many meals a day?: 2 Eats fruit and vegetables daily?: (!) no Most meals are obtained by: preparing own meals In the last 2 weeks, have you had any of the following?: none Diabetic:: no  Functional Status Activities of Daily Living (to include ambulation/medication): Independent Ambulation: Independent Medication Administration: Independent Home Management (perform basic housework or laundry): Independent Manage your own finances?: yes Primary transportation is: driving Concerns about vision?: no *vision screening is required for WTM* Concerns about hearing?: no  Fall Screening Falls in the past year?: 0 Number of falls in past year: 0 Was there an injury with Fall?: 0 Fall Risk Category Calculator: 0 Patient Fall Risk Level: Low Fall Risk  Fall Risk Patient at Risk for Falls Due to: No Fall Risks Fall risk Follow up: Falls evaluation completed  Home and Transportation Safety: All rugs have non-skid backing?: yes All stairs or steps have railings?: yes Grab bars in the bathtub or shower?: (!) no Have non-skid surface in bathtub or shower?: (!) no Good home lighting?: yes Regular seat belt use?:  yes Hospital stays in the last year:: no  Cognitive Assessment Difficulty concentrating, remembering, or making decisions? : no Will 6CIT or Mini Cog be Completed: yes What year is it?: 0 points What month is it?: 0 points Give patient an address phrase to remember (5 components): [address] [place], [place] About what time is it?: 0 points Count backwards from 20 to 1: 0 points Say the months of the year in reverse: 0 points Repeat the address phrase from earlier: 0 points 6 CIT Score: 0 points  Advance Directives (For Healthcare) Does Patient Have a Medical Advance Directive?: Yes Does patient want to make changes to medical advance directive?: No - Patient declined Type of Advance Directive: Healthcare Power of [place]; Living will  Reviewed/Updated  Reviewed/Updated: Medical History; Surgical History; Medications; Allergies; Care Teams; Patient Goals    Allergies (verified) Sulfa antibiotics, Azithromycin , Codeine, Elemental sulfur, Lovastatin, and Miconazole nitrate   Current Medications (verified) Outpatient Encounter Medications as of 07/31/2024  Medication Sig   albuterol  (ACCUNEB ) 1.25 MG/3ML nebulizer solution 1.25 mg.   Biotin 1000 MCG tablet Take 1 mg by mouth.   celecoxib  (CELEBREX ) 100 MG capsule Take 100 mg by mouth 2 (two) times daily.   cholecalciferol (VITAMIN D3) 25 MCG (1000 UNIT) tablet Take 1,000 Units by mouth daily.   estradiol  (ESTRACE ) 0.01 % CREA vaginal cream Place 0.5g nightly for two weeks then twice a week after   fluticasone  (FLONASE ) 50 MCG/ACT nasal spray SHAKE LIQUID AND USE 2 SPRAYS IN EACH NOSTRIL DAILY   gabapentin  (NEURONTIN ) 100 MG capsule Take 100 mg by mouth daily as needed.   hydrochlorothiazide  (HYDRODIURIL ) 25 MG tablet Take 1 tablet (25 mg total) by mouth daily.   loratadine (CLARITIN)  10 MG tablet Take 10 mg by mouth daily.   magnesium gluconate (MAGONATE) 500 (27 Mg) MG TABS tablet Take 500 mg by mouth in the morning and at  bedtime.   Pitavastatin  Calcium  1 MG TABS Take 1 tablet (1 mg total) by mouth at bedtime.   sodium chloride HYPERTONIC 3 % nebulizer solution Take 4 mLs by nebulization as needed.   traZODone  (DESYREL ) 50 MG tablet Take 0.5-2 tablets (25-100 mg total) by mouth at bedtime as needed for sleep.   Trospium  Chloride 60 MG CP24 Take 1 capsule (60 mg total) by mouth daily.   [DISCONTINUED] Zinc 22.5 MG TABS Take by mouth daily.   No facility-administered encounter medications on file as of 07/31/2024.    History: Past Medical History:  Diagnosis Date   Allergy    Alopecia    areata   Anxiety    situational   Depression    Encounter for Medicare annual wellness exam 06/20/2023   Hyperlipidemia    Hypertension    Lung infiltrate 07/12/2008   Resolved on its own without biopsy   Menopause, premature    Migraines    MVP (mitral valve prolapse)    Protrusion of lumbar intervertebral disc    L3-S1 - has had epidural injections   Past Surgical History:  Procedure Laterality Date   ABDOMINAL HYSTERECTOMY  07/12/1977   endometriosis   APPENDECTOMY     Family History  Problem Relation Age of Onset   Heart attack Mother    Hypertension Mother    Coronary artery disease Mother 44   Liver cancer Mother    Cancer Mother    Heart disease Mother    Heart attack Father    Early death Father    Heart disease Father    Heart attack Brother 62   Stroke Brother    Cancer Brother    COPD Brother    Heart disease Brother    Hypertension Brother    Heart disease Brother    Cancer Brother    COPD Brother    Hypertension Brother    Social History   Occupational History   Occupation: retired    Comment: airline pilot  Tobacco Use   Smoking status: Never   Smokeless tobacco: Never  Vaping Use   Vaping status: Never Used  Substance and Sexual Activity   Alcohol use: Not Currently    Comment: maybe once a month   Drug use: No   Sexual activity: Not Currently   Tobacco  Counseling Counseling given: Not Answered  SDOH Screenings   Food Insecurity: No Food Insecurity (07/31/2024)  Housing: Low Risk (07/31/2024)  Transportation Needs: No Transportation Needs (07/31/2024)  Utilities: Not At Risk (07/31/2024)  Alcohol Screen: Low Risk (07/16/2024)  Depression (PHQ2-9): Low Risk (07/31/2024)  Financial Resource Strain: Low Risk (07/16/2024)  Physical Activity: Inactive (07/31/2024)  Social Connections: Socially Integrated (07/31/2024)  Stress: Stress Concern Present (07/31/2024)  Tobacco Use: Low Risk (07/31/2024)  Health Literacy: Adequate Health Literacy (07/31/2024)   See flowsheets for full screening details  Depression Screen PHQ 2 & 9 Depression Scale- Over the past 2 weeks, how often have you been bothered by any of the following problems? Little interest or pleasure in doing things: 0 Feeling down, depressed, or hopeless (PHQ Adolescent also includes...irritable): 0 PHQ-2 Total Score: 0     Goals Addressed             This Visit's Progress    Patient Stated  Patient states she wants to walk better and travel more.              Objective:    Today's Vitals   07/31/24 1030  BP: 116/75  Pulse: 74  SpO2: 100%  Weight: 139 lb (63 kg)  Height: 5' 1 (1.549 m)   Body mass index is 26.26 kg/m.  Hearing/Vision screen No results found. Immunizations and Health Maintenance Health Maintenance  Topic Date Due   Zoster Vaccines- Shingrix (1 of 2) Never done   COVID-19 Vaccine (4 - 2025-26 season) 08/16/2024 (Originally 03/12/2024)   Medicare Annual Wellness (AWV)  07/31/2025   Mammogram  08/07/2025   DTaP/Tdap/Td (3 - Td or Tdap) 12/11/2025   Colonoscopy  04/20/2031   Pneumococcal Vaccine: 50+ Years  Completed   Influenza Vaccine  Completed   Bone Density Scan  Completed   Hepatitis C Screening  Completed   Meningococcal B Vaccine  Aged Out   Fecal DNA (Cologuard)  Discontinued        Assessment/Plan:  This is a routine wellness  examination for Stephanie Stone.  Patient Care Team: Alvan Dorothyann BIRCH, MD as PCP - General (Family Medicine) Marilynne Rosaline SAILOR, MD as Consulting Physician (Obstetrics and Gynecology)  I have personally reviewed and noted the following in the patients chart:   Medical and social history Use of alcohol, tobacco or illicit drugs  Current medications and supplements including opioid prescriptions. Functional ability and status Nutritional status Physical activity Advanced directives List of other physicians Hospitalizations, surgeries, and ER visits in previous 12 months Vitals Screenings to include cognitive, depression, and falls Referrals and appointments  No orders of the defined types were placed in this encounter.  In addition, I have reviewed and discussed with patient certain preventive protocols, quality metrics, and best practice recommendations. A written personalized care plan for preventive services as well as general preventive health recommendations were provided to patient.   Bonny Jon Mayor, CMA   07/31/2024   Return in 1 year (on 07/31/2025).  After Visit Summary: (In Person-Declined) Patient declined AVS at this time.  Nurse Notes:   Stephanie Stone is a 74 y.o. female patient of Dorothyann Alvan, MD who had a Medicare Annual Wellness Visit today. Stephanie Stone lives with her husband. She reports that she is socially active and does interact with friends/family regularly. She is moderately physically active. She enjoys painting.   "

## 2024-07-31 NOTE — Patient Instructions (Signed)
 Stephanie Stone,  Thank you for taking the time for your Medicare Wellness Visit. I appreciate your continued commitment to your health goals. Please review the care plan we discussed, and feel free to reach out if I can assist you further.  Please note that Annual Wellness Visits do not include a physical exam. Some assessments may be limited, especially if the visit was conducted virtually. If needed, we may recommend an in-person follow-up with your provider.  Ongoing Care Seeing your primary care provider every 3 to 6 months helps us  monitor your health and provide consistent, personalized care.   Referrals If a referral was made during today's visit and you haven't received any updates within two weeks, please contact the referred provider directly to check on the status.  Recommended Screenings:  Health Maintenance  Topic Date Due   Zoster (Shingles) Vaccine (1 of 2) Never done   Medicare Annual Wellness Visit  06/19/2024   COVID-19 Vaccine (4 - 2025-26 season) 08/16/2024*   Breast Cancer Screening  08/07/2025   DTaP/Tdap/Td vaccine (3 - Td or Tdap) 12/11/2025   Colon Cancer Screening  04/20/2031   Pneumococcal Vaccine for age over 19  Completed   Flu Shot  Completed   Osteoporosis screening with Bone Density Scan  Completed   Hepatitis C Screening  Completed   Meningitis B Vaccine  Aged Out   Cologuard (Stool DNA test)  Discontinued  *Topic was postponed. The date shown is not the original due date.       07/31/2024   10:47 AM  Advanced Directives  Does Patient Have a Medical Advance Directive? Yes  Type of Estate Agent of [place];Living will  Does patient want to make changes to medical advance directive? No - Patient declined    Vision: Annual vision screenings are recommended for early detection of glaucoma, cataracts, and diabetic retinopathy. These exams can also reveal signs of chronic conditions such as diabetes and high blood pressure.  Dental:  Annual dental screenings help detect early signs of oral cancer, gum disease, and other conditions linked to overall health, including heart disease and diabetes.  Please see the attached documents for additional preventive care recommendations.

## 2024-08-03 ENCOUNTER — Ambulatory Visit: Admitting: Obstetrics and Gynecology

## 2024-08-17 ENCOUNTER — Ambulatory Visit: Admitting: Obstetrics and Gynecology

## 2024-08-20 ENCOUNTER — Ambulatory Visit: Admitting: Obstetrics and Gynecology

## 2025-04-11 ENCOUNTER — Encounter: Admitting: Family Medicine

## 2025-08-01 ENCOUNTER — Ambulatory Visit
